# Patient Record
Sex: Male | Born: 1935 | Race: Black or African American | Hispanic: No | Marital: Single | Smoking: Never smoker
Health system: Southern US, Community
[De-identification: ages and names within clinical notes are randomized; demographics above are authoritative.]

## PROBLEM LIST (undated history)

## (undated) DIAGNOSIS — N189 Chronic kidney disease, unspecified: Secondary | ICD-10-CM

## (undated) DIAGNOSIS — S72009A Fracture of unspecified part of neck of unspecified femur, initial encounter for closed fracture: Secondary | ICD-10-CM

## (undated) DIAGNOSIS — I4891 Unspecified atrial fibrillation: Secondary | ICD-10-CM

## (undated) DIAGNOSIS — K219 Gastro-esophageal reflux disease without esophagitis: Secondary | ICD-10-CM

## (undated) DIAGNOSIS — E079 Disorder of thyroid, unspecified: Secondary | ICD-10-CM

## (undated) DIAGNOSIS — R29898 Other symptoms and signs involving the musculoskeletal system: Secondary | ICD-10-CM

## (undated) DIAGNOSIS — M1990 Unspecified osteoarthritis, unspecified site: Secondary | ICD-10-CM

## (undated) HISTORY — DX: Chronic kidney disease, unspecified: N18.9

## (undated) HISTORY — DX: Other symptoms and signs involving the musculoskeletal system: R29.898

## (undated) HISTORY — PX: EYE SURGERY: SHX253

## (undated) HISTORY — DX: Gastro-esophageal reflux disease without esophagitis: K21.9

## (undated) HISTORY — PX: HIP SURGERY: SHX245

## (undated) HISTORY — DX: Unspecified osteoarthritis, unspecified site: M19.90

## (undated) HISTORY — DX: Disorder of thyroid, unspecified: E07.9

## (undated) HISTORY — DX: Unspecified atrial fibrillation: I48.91

## (undated) HISTORY — DX: Fracture of unspecified part of neck of unspecified femur, initial encounter for closed fracture: S72.009A

---

## 2010-08-09 DIAGNOSIS — S72009A Fracture of unspecified part of neck of unspecified femur, initial encounter for closed fracture: Secondary | ICD-10-CM

## 2010-08-09 HISTORY — DX: Fracture of unspecified part of neck of unspecified femur, initial encounter for closed fracture: S72.009A

## 2010-08-10 ENCOUNTER — Ambulatory Visit: Payer: Self-pay | Admitting: Cardiology

## 2010-08-10 ENCOUNTER — Inpatient Hospital Stay (HOSPITAL_COMMUNITY): Admission: EM | Admit: 2010-08-10 | Discharge: 2010-08-15 | Payer: Self-pay | Admitting: Emergency Medicine

## 2010-12-20 LAB — BASIC METABOLIC PANEL
BUN: 15 mg/dL (ref 6–23)
CO2: 24 mEq/L (ref 19–32)
CO2: 24 mEq/L (ref 19–32)
CO2: 25 mEq/L (ref 19–32)
CO2: 26 mEq/L (ref 19–32)
Calcium: 8.5 mg/dL (ref 8.4–10.5)
Calcium: 8.7 mg/dL (ref 8.4–10.5)
Chloride: 105 mEq/L (ref 96–112)
Chloride: 108 mEq/L (ref 96–112)
Chloride: 110 mEq/L (ref 96–112)
Creatinine, Ser: 1.66 mg/dL — ABNORMAL HIGH (ref 0.4–1.5)
GFR calc Af Amer: 60 mL/min — ABNORMAL LOW (ref >60)
GFR calc Af Amer: >60 mL/min (ref >60)
GFR calc non Af Amer: 39 mL/min — ABNORMAL LOW (ref >60)
GFR calc non Af Amer: 53 mL/min — ABNORMAL LOW (ref >60)
Glucose, Bld: 105 mg/dL — ABNORMAL HIGH (ref 70–99)
Glucose, Bld: 113 mg/dL — ABNORMAL HIGH (ref 70–99)
Glucose, Bld: 121 mg/dL — ABNORMAL HIGH (ref 70–99)
Potassium: 4.2 mEq/L (ref 3.5–5.1)
Potassium: 4.4 mEq/L (ref 3.5–5.1)
Potassium: 4.5 mEq/L (ref 3.5–5.1)
Potassium: 4.5 mEq/L (ref 3.5–5.1)
Potassium: 4.6 mEq/L (ref 3.5–5.1)
Sodium: 135 mEq/L (ref 135–145)
Sodium: 137 mEq/L (ref 135–145)
Sodium: 137 mEq/L (ref 135–145)
Sodium: 137 mEq/L (ref 135–145)

## 2010-12-20 LAB — CBC
HCT: 29.3 % — ABNORMAL LOW (ref 39.0–52.0)
HCT: 32.7 % — ABNORMAL LOW (ref 39.0–52.0)
HCT: 34.2 % — ABNORMAL LOW (ref 39.0–52.0)
HCT: 39.3 % (ref 39.0–52.0)
HCT: 41.8 % (ref 39.0–52.0)
Hemoglobin: 10.5 g/dL — ABNORMAL LOW (ref 13.0–17.0)
Hemoglobin: 11 g/dL — ABNORMAL LOW (ref 13.0–17.0)
Hemoglobin: 12.5 g/dL — ABNORMAL LOW (ref 13.0–17.0)
Hemoglobin: 13.5 g/dL (ref 13.0–17.0)
MCH: 26.5 pg (ref 26.0–34.0)
MCH: 27.1 pg (ref 26.0–34.0)
MCHC: 31.8 g/dL (ref 30.0–36.0)
MCHC: 32.2 g/dL (ref 30.0–36.0)
MCV: 82.5 fL (ref 78.0–100.0)
MCV: 83.8 fL (ref 78.0–100.0)
MCV: 84.1 fL (ref 78.0–100.0)
Platelets: 166 10*3/uL (ref 150–400)
Platelets: 170 10*3/uL (ref 150–400)
RBC: 3.55 MIL/uL — ABNORMAL LOW (ref 4.22–5.81)
RBC: 3.89 MIL/uL — ABNORMAL LOW (ref 4.22–5.81)
RBC: 4.12 MIL/uL — ABNORMAL LOW (ref 4.22–5.81)
RBC: 4.99 MIL/uL (ref 4.22–5.81)
RDW: 14.6 % (ref 11.5–15.5)
WBC: 10.5 10*3/uL (ref 4.0–10.5)
WBC: 7.4 10*3/uL (ref 4.0–10.5)
WBC: 9.2 10*3/uL (ref 4.0–10.5)

## 2010-12-20 LAB — COMPREHENSIVE METABOLIC PANEL
Albumin: 3.3 g/dL — ABNORMAL LOW (ref 3.5–5.2)
Alkaline Phosphatase: 60 U/L (ref 39–117)
BUN: 15 mg/dL (ref 6–23)
CO2: 24 mEq/L (ref 19–32)
Chloride: 106 mEq/L (ref 96–112)
GFR calc non Af Amer: 36 mL/min — ABNORMAL LOW (ref >60)
Glucose, Bld: 99 mg/dL (ref 70–99)
Potassium: 4.2 mEq/L (ref 3.5–5.1)
Total Bilirubin: 0.7 mg/dL (ref 0.3–1.2)

## 2010-12-20 LAB — PROTIME-INR
INR: 1.34 (ref 0.00–1.49)
INR: 1.39 (ref 0.00–1.49)
INR: 1.55 — ABNORMAL HIGH (ref 0.00–1.49)
INR: 1.56 — ABNORMAL HIGH (ref 0.00–1.49)
INR: 1.86 — ABNORMAL HIGH (ref 0.00–1.49)
Prothrombin Time: 17.3 seconds — ABNORMAL HIGH (ref 11.6–15.2)
Prothrombin Time: 20.9 seconds — ABNORMAL HIGH (ref 11.6–15.2)

## 2010-12-20 LAB — DIFFERENTIAL
Basophils Absolute: 0 10*3/uL (ref 0.0–0.1)
Basophils Relative: 0 % (ref 0–1)
Monocytes Absolute: 0.6 10*3/uL (ref 0.1–1.0)
Neutro Abs: 8.9 10*3/uL — ABNORMAL HIGH (ref 1.7–7.7)

## 2010-12-20 LAB — CROSSMATCH: Unit division: 0

## 2010-12-20 LAB — LIPID PANEL
Cholesterol: 101 mg/dL (ref 0–200)
HDL: 49 mg/dL (ref >39)
Triglycerides: 62 mg/dL (ref <150)

## 2010-12-20 LAB — APTT: aPTT: 32 seconds (ref 24–37)

## 2010-12-20 LAB — ABO/RH: ABO/RH(D): A POS

## 2011-01-03 ENCOUNTER — Encounter: Payer: Self-pay | Admitting: Family Medicine

## 2011-01-03 ENCOUNTER — Ambulatory Visit (INDEPENDENT_AMBULATORY_CARE_PROVIDER_SITE_OTHER): Payer: MEDICARE | Admitting: Family Medicine

## 2011-01-03 ENCOUNTER — Telehealth: Payer: Self-pay | Admitting: Family Medicine

## 2011-01-03 DIAGNOSIS — E039 Hypothyroidism, unspecified: Secondary | ICD-10-CM | POA: Insufficient documentation

## 2011-01-03 DIAGNOSIS — I480 Paroxysmal atrial fibrillation: Secondary | ICD-10-CM | POA: Insufficient documentation

## 2011-01-03 DIAGNOSIS — Z7901 Long term (current) use of anticoagulants: Secondary | ICD-10-CM | POA: Insufficient documentation

## 2011-01-03 DIAGNOSIS — Z5181 Encounter for therapeutic drug level monitoring: Secondary | ICD-10-CM

## 2011-01-03 DIAGNOSIS — M1990 Unspecified osteoarthritis, unspecified site: Secondary | ICD-10-CM

## 2011-01-03 DIAGNOSIS — M6281 Muscle weakness (generalized): Secondary | ICD-10-CM

## 2011-01-03 DIAGNOSIS — I4891 Unspecified atrial fibrillation: Secondary | ICD-10-CM

## 2011-01-03 DIAGNOSIS — R29898 Other symptoms and signs involving the musculoskeletal system: Secondary | ICD-10-CM | POA: Insufficient documentation

## 2011-01-03 LAB — POCT INR: INR: 1.8

## 2011-01-03 MED ORDER — LEVOTHYROXINE SODIUM 88 MCG PO TABS: 88.0000 ug | ORAL_TABLET | Freq: Every day | ORAL | Status: DC

## 2011-01-03 NOTE — Patient Instructions (Addendum)
Schedule a follow up appointment for INR in:1 month. Don't change your thyroid medicine.  I'll get your old records.   Take care and call the clinic if you have concerns.  Glad to see you today.  Schedule a follow up appointment with Para March in 6 months.  OV.

## 2011-01-03 NOTE — Assessment & Plan Note (Signed)
S/p R hip pin per Dr. Charlann Boxer

## 2011-01-03 NOTE — Assessment & Plan Note (Signed)
Requesting old records. Prev TSH wnl at [facility].

## 2011-01-03 NOTE — Assessment & Plan Note (Signed)
Requesting old records. INR today.

## 2011-01-03 NOTE — Assessment & Plan Note (Signed)
Requesting old records.  Longstanding.

## 2011-01-03 NOTE — Telephone Encounter (Signed)
Order for INR

## 2011-01-04 ENCOUNTER — Encounter: Payer: Self-pay | Admitting: Family Medicine

## 2011-01-04 ENCOUNTER — Telehealth: Payer: Self-pay | Admitting: *Deleted

## 2011-01-04 DIAGNOSIS — L602 Onychogryphosis: Secondary | ICD-10-CM

## 2011-01-04 NOTE — Telephone Encounter (Signed)
I ordered the referral.  Please thank patient for the reminder.  I didn't order the referral yesterday and I apologize for that.  Thanks.

## 2011-01-04 NOTE — Telephone Encounter (Signed)
Pt was seen yesterday and was told that he would be referred to a podiatrist to have his nails trimmed, he would prefer to go to [facility].

## 2011-01-04 NOTE — Progress Notes (Signed)
New Pt.   H/o likely AF with known IRR per patient on coumadin for years.  "I think it was from the fibrillation with my heart."  Requesting records.  Due for INR check.  No bleeding, bruising.  No complaints.  R hip fx and repair per ortho.  [facility] notes reviewed from 2011.  Longstanding L leg weakness, requesting records.  Known foot drop per patient but he doesn't give h/o CVA.  Pt doesn't know the source of the weakness and family doesn't either.  H/o thyroid disease, requesting records.  Prev TSH wnl in [facility] 2011.    Has had some occ abdominal pain L side, mainly with repositioning since going home from the rehab facility after hip fx.  Intermittent, not related to BM, not present now.  No associated sx.   PMH and SH reviewed  ROS: See HPI, otherwise noncontributory.  Meds, vitals, and allergies reviewed.   Nad, in WC Mmm Poor dentition Neck supple IRR but not tachy ctab Abdomen soft, not ttp  L leg diffusely weak compared to R leg, with weakness on dorsiflexion noted. 1+ DP pulses b.  Trace edema in L foot, but this appears chronic.  Nails thickened.  No skin breakdown.

## 2011-01-31 ENCOUNTER — Ambulatory Visit (INDEPENDENT_AMBULATORY_CARE_PROVIDER_SITE_OTHER): Payer: MEDICARE | Admitting: Family Medicine

## 2011-01-31 DIAGNOSIS — Z7901 Long term (current) use of anticoagulants: Secondary | ICD-10-CM

## 2011-01-31 DIAGNOSIS — I4891 Unspecified atrial fibrillation: Secondary | ICD-10-CM

## 2011-01-31 DIAGNOSIS — Z5181 Encounter for therapeutic drug level monitoring: Secondary | ICD-10-CM

## 2011-01-31 LAB — POCT INR: INR: 2.4

## 2011-02-28 ENCOUNTER — Ambulatory Visit (INDEPENDENT_AMBULATORY_CARE_PROVIDER_SITE_OTHER): Payer: Federal, State, Local not specified - PPO | Admitting: Family Medicine

## 2011-02-28 ENCOUNTER — Other Ambulatory Visit: Payer: Federal, State, Local not specified - PPO

## 2011-02-28 DIAGNOSIS — Z7901 Long term (current) use of anticoagulants: Secondary | ICD-10-CM

## 2011-02-28 DIAGNOSIS — Z5181 Encounter for therapeutic drug level monitoring: Secondary | ICD-10-CM

## 2011-02-28 DIAGNOSIS — I4891 Unspecified atrial fibrillation: Secondary | ICD-10-CM

## 2011-02-28 LAB — POCT INR: INR: 2.1

## 2011-03-14 ENCOUNTER — Other Ambulatory Visit: Payer: Self-pay | Admitting: Family Medicine

## 2011-03-14 DIAGNOSIS — I4891 Unspecified atrial fibrillation: Secondary | ICD-10-CM

## 2011-03-14 DIAGNOSIS — O323XX0 Maternal care for face, brow and chin presentation, not applicable or unspecified: Secondary | ICD-10-CM

## 2011-03-16 ENCOUNTER — Other Ambulatory Visit (INDEPENDENT_AMBULATORY_CARE_PROVIDER_SITE_OTHER): Payer: MEDICARE | Admitting: Family Medicine

## 2011-03-16 DIAGNOSIS — Z7901 Long term (current) use of anticoagulants: Secondary | ICD-10-CM

## 2011-03-16 DIAGNOSIS — I4891 Unspecified atrial fibrillation: Secondary | ICD-10-CM

## 2011-03-16 LAB — CBC WITH DIFFERENTIAL/PLATELET
Basophils Absolute: 0 10*3/uL (ref 0.0–0.1)
Basophils Relative: 0.5 % (ref 0.0–3.0)
Eosinophils Absolute: 0.2 10*3/uL (ref 0.0–0.7)
Lymphocytes Relative: 25.8 % (ref 12.0–46.0)
MCHC: 33.2 g/dL (ref 30.0–36.0)
MCV: 82 fl (ref 78.0–100.0)
Monocytes Absolute: 0.4 10*3/uL (ref 0.1–1.0)
Neutrophils Relative %: 63.8 % (ref 43.0–77.0)
Platelets: 214 10*3/uL (ref 150.0–400.0)
RDW: 17 % — ABNORMAL HIGH (ref 11.5–14.6)

## 2011-03-16 LAB — LIPID PANEL
Cholesterol: 136 mg/dL (ref 0–200)
LDL Cholesterol: 63 mg/dL (ref 0–99)
Total CHOL/HDL Ratio: 3
Triglycerides: 129 mg/dL (ref 0.0–149.0)
VLDL: 25.8 mg/dL (ref 0.0–40.0)

## 2011-03-16 LAB — COMPREHENSIVE METABOLIC PANEL
AST: 20 U/L (ref 0–37)
Albumin: 3.5 g/dL (ref 3.5–5.2)
Alkaline Phosphatase: 78 U/L (ref 39–117)
BUN: 15 mg/dL (ref 6–23)
Glucose, Bld: 92 mg/dL (ref 70–99)
Potassium: 4.1 mEq/L (ref 3.5–5.1)
Sodium: 139 mEq/L (ref 135–145)
Total Bilirubin: 0.3 mg/dL (ref 0.3–1.2)

## 2011-03-17 ENCOUNTER — Ambulatory Visit: Payer: Self-pay | Admitting: Family Medicine

## 2011-03-24 ENCOUNTER — Ambulatory Visit (INDEPENDENT_AMBULATORY_CARE_PROVIDER_SITE_OTHER): Payer: Federal, State, Local not specified - PPO | Admitting: Family Medicine

## 2011-03-24 ENCOUNTER — Encounter: Payer: Self-pay | Admitting: Family Medicine

## 2011-03-24 DIAGNOSIS — Z7901 Long term (current) use of anticoagulants: Secondary | ICD-10-CM

## 2011-03-24 DIAGNOSIS — Z5181 Encounter for therapeutic drug level monitoring: Secondary | ICD-10-CM

## 2011-03-24 DIAGNOSIS — M6281 Muscle weakness (generalized): Secondary | ICD-10-CM

## 2011-03-24 DIAGNOSIS — I4891 Unspecified atrial fibrillation: Secondary | ICD-10-CM

## 2011-03-24 DIAGNOSIS — R29898 Other symptoms and signs involving the musculoskeletal system: Secondary | ICD-10-CM

## 2011-03-24 DIAGNOSIS — Z7189 Other specified counseling: Secondary | ICD-10-CM

## 2011-03-24 DIAGNOSIS — Z125 Encounter for screening for malignant neoplasm of prostate: Secondary | ICD-10-CM

## 2011-03-24 DIAGNOSIS — Z1211 Encounter for screening for malignant neoplasm of colon: Secondary | ICD-10-CM

## 2011-03-24 DIAGNOSIS — E039 Hypothyroidism, unspecified: Secondary | ICD-10-CM

## 2011-03-24 LAB — POCT INR: INR: 2.5

## 2011-03-24 NOTE — Patient Instructions (Signed)
Continue 2.5 mg daily, recheck 4 weeks 

## 2011-03-24 NOTE — Patient Instructions (Addendum)
Check with your insurance to see if they will cover the shingles shot. I would get a flu shot each fall.   I want to see you back in 6 months for a 30 minute visit. Go to the lab for your INR.  Take care.   Check on getting TED hose for your legs.  Try to keep your legs elevated.

## 2011-03-26 ENCOUNTER — Encounter: Payer: Self-pay | Admitting: Family Medicine

## 2011-03-26 DIAGNOSIS — Z7189 Other specified counseling: Secondary | ICD-10-CM | POA: Insufficient documentation

## 2011-03-26 DIAGNOSIS — Z1211 Encounter for screening for malignant neoplasm of colon: Secondary | ICD-10-CM | POA: Insufficient documentation

## 2011-03-26 DIAGNOSIS — Z125 Encounter for screening for malignant neoplasm of prostate: Secondary | ICD-10-CM | POA: Insufficient documentation

## 2011-03-26 NOTE — Assessment & Plan Note (Signed)
At baseline, continue with parking sticker and wheelchair prn.  Form filled out for parking sticker.

## 2011-03-26 NOTE — Assessment & Plan Note (Addendum)
No change in meds.  I don't want to dec his pressures.  D/w pt.  He'll elevate the legs and get ted hose to see if this helps. F/u as planned, sooner prn.

## 2011-03-26 NOTE — Assessment & Plan Note (Signed)
TSH wnl, no change in meds.  

## 2011-03-26 NOTE — Progress Notes (Signed)
Routine OV.  Labs d/w pt.   AF- no tachy/palpitations per pt.  No CP.  BLE edema continues is legs are dependent.  He would like gentler compression hose; he's asking about options.  Feeling well o/w.   Thyroid disease, on replacement and labs d/w pt.  Doing well w/o mass in neck, neck pain, dysphagia.  Renal disease, baseline Cr ~1.6.  No recent change.    Occ L abd wall pain with rotation, getting in and out of bed.  No other times when it is painful.  No change in BMs.   No injury.   Colon cancer screening.  Prev with colonoscopy done.  I d/w pt and family today.  Without passing blood- and he isn't know to be doing so- we talked about not screening further since he would be higher risk for anesthesia given his age.  He and his family thought it wise not to proceed with screening.  Prostate CA screening- d/w. No FH.  No symptoms.  As above, he and family thought it reasonable no to pursue screening.  I support this as he would likely have to hold the coumadin for a biopsy, should he have an abnormality.    Old records requested.   PMH and SH reviewed  ROS: See HPI, otherwise noncontributory.  Meds, vitals, and allergies reviewed.   Nad, in w/c ncat Tm w/o erythema Mmm, poor dentition Neck supple w/o TMG, LA rrr with occ ectopy ctab abd soft, not ttp Ext with symm 1+ edema and 1+ DP pulses

## 2011-04-21 ENCOUNTER — Ambulatory Visit (INDEPENDENT_AMBULATORY_CARE_PROVIDER_SITE_OTHER): Payer: Federal, State, Local not specified - PPO | Admitting: Family Medicine

## 2011-04-21 DIAGNOSIS — Z7901 Long term (current) use of anticoagulants: Secondary | ICD-10-CM

## 2011-04-21 DIAGNOSIS — Z5181 Encounter for therapeutic drug level monitoring: Secondary | ICD-10-CM

## 2011-04-21 DIAGNOSIS — I4891 Unspecified atrial fibrillation: Secondary | ICD-10-CM

## 2011-04-21 LAB — POCT INR: INR: 2.9

## 2011-04-21 NOTE — Patient Instructions (Addendum)
Continue 2.5 mg daily, recheck 4 weeks 

## 2011-05-02 ENCOUNTER — Encounter: Payer: Self-pay | Admitting: Podiatry

## 2011-05-19 ENCOUNTER — Ambulatory Visit (INDEPENDENT_AMBULATORY_CARE_PROVIDER_SITE_OTHER): Payer: Federal, State, Local not specified - PPO | Admitting: Family Medicine

## 2011-05-19 DIAGNOSIS — Z5181 Encounter for therapeutic drug level monitoring: Secondary | ICD-10-CM

## 2011-05-19 DIAGNOSIS — I4891 Unspecified atrial fibrillation: Secondary | ICD-10-CM

## 2011-05-19 DIAGNOSIS — Z7901 Long term (current) use of anticoagulants: Secondary | ICD-10-CM

## 2011-05-19 LAB — POCT INR: INR: 2.4

## 2011-05-19 NOTE — Patient Instructions (Signed)
Continue current dose, check in 4 weeks  

## 2011-06-05 ENCOUNTER — Encounter: Payer: Self-pay | Admitting: Family Medicine

## 2011-06-05 DIAGNOSIS — N183 Chronic kidney disease, stage 3 (moderate): Secondary | ICD-10-CM

## 2011-06-05 DIAGNOSIS — K209 Esophagitis, unspecified: Secondary | ICD-10-CM

## 2011-06-05 DIAGNOSIS — N179 Acute kidney failure, unspecified: Secondary | ICD-10-CM | POA: Insufficient documentation

## 2011-06-16 ENCOUNTER — Other Ambulatory Visit: Payer: Self-pay | Admitting: *Deleted

## 2011-06-16 ENCOUNTER — Ambulatory Visit (INDEPENDENT_AMBULATORY_CARE_PROVIDER_SITE_OTHER): Payer: Federal, State, Local not specified - PPO | Admitting: Family Medicine

## 2011-06-16 DIAGNOSIS — E039 Hypothyroidism, unspecified: Secondary | ICD-10-CM

## 2011-06-16 DIAGNOSIS — Z5181 Encounter for therapeutic drug level monitoring: Secondary | ICD-10-CM

## 2011-06-16 DIAGNOSIS — Z7901 Long term (current) use of anticoagulants: Secondary | ICD-10-CM

## 2011-06-16 DIAGNOSIS — I4891 Unspecified atrial fibrillation: Secondary | ICD-10-CM

## 2011-06-16 MED ORDER — WARFARIN SODIUM 5 MG PO TABS: ORAL_TABLET | ORAL | Status: DC

## 2011-06-16 MED ORDER — LEVOTHYROXINE SODIUM 88 MCG PO TABS: 88.0000 ug | ORAL_TABLET | Freq: Every day | ORAL | Status: DC

## 2011-06-16 NOTE — Patient Instructions (Signed)
Continue 2.5 mg daily, recheck 4 weeks 

## 2011-07-04 ENCOUNTER — Ambulatory Visit (INDEPENDENT_AMBULATORY_CARE_PROVIDER_SITE_OTHER): Payer: Federal, State, Local not specified - PPO | Admitting: Family Medicine

## 2011-07-04 ENCOUNTER — Encounter: Payer: Self-pay | Admitting: Family Medicine

## 2011-07-04 VITALS — BP 118/76 | HR 68 | Temp 98.6°F | Wt 172.2 lb

## 2011-07-04 DIAGNOSIS — R109 Unspecified abdominal pain: Secondary | ICD-10-CM

## 2011-07-04 DIAGNOSIS — R209 Unspecified disturbances of skin sensation: Secondary | ICD-10-CM

## 2011-07-04 DIAGNOSIS — R200 Anesthesia of skin: Secondary | ICD-10-CM | POA: Insufficient documentation

## 2011-07-04 DIAGNOSIS — R29898 Other symptoms and signs involving the musculoskeletal system: Secondary | ICD-10-CM

## 2011-07-04 DIAGNOSIS — K117 Disturbances of salivary secretion: Secondary | ICD-10-CM

## 2011-07-04 DIAGNOSIS — M6281 Muscle weakness (generalized): Secondary | ICD-10-CM

## 2011-07-04 DIAGNOSIS — R682 Dry mouth, unspecified: Secondary | ICD-10-CM | POA: Insufficient documentation

## 2011-07-04 DIAGNOSIS — Z23 Encounter for immunization: Secondary | ICD-10-CM

## 2011-07-04 DIAGNOSIS — R202 Paresthesia of skin: Secondary | ICD-10-CM

## 2011-07-04 DIAGNOSIS — K209 Esophagitis, unspecified without bleeding: Secondary | ICD-10-CM

## 2011-07-04 NOTE — Assessment & Plan Note (Signed)
Brief sx w/o red flag sx. Likely MSK source, will have pt use PT and try to ease in/out of bed as this is the time he is most likely to have sx.

## 2011-07-04 NOTE — Patient Instructions (Signed)
See Shirlee Limerick about your referral before your leave today. I would get a cock up splint for your wrist.  See if that helps.   Take randitine 75-150mg  a day for the heartburn.  You can take it twice a day if needed. I want you to come back for a visit in about 4 months.  Take care.   Glad to see you.

## 2011-07-04 NOTE — Assessment & Plan Note (Signed)
Restart PT and follow clinically.

## 2011-07-04 NOTE — Assessment & Plan Note (Signed)
With likely GERD sx.  Start H2 blocker and notify clinic if sx continue.

## 2011-07-04 NOTE — Assessment & Plan Note (Signed)
Sugar wnl, d/w pt about limiting sweets and f/u prn.

## 2011-07-04 NOTE — Assessment & Plan Note (Signed)
Likely median nerve and d/w pt about cock up splint, notify me if not improved.

## 2011-07-04 NOTE — Progress Notes (Signed)
Polyphagia. Eating a lot of sweets, some dry mouth. He was asking about DM2.  No hx.  No polyuria.  Sugar 91 today.    L abd pain.  As prev.  Brief, positional, getting in and out of bed.  No fever, chills, diarrhea, blood in stools.  Episodic.    GERD- occ heartburn.  Occ regurg of food.  Taking tums with occ relief.  No hematemesis.    L hand numb.  Episodic.  L 1-3rd fingers.  No weakness. No trauma.  No rash or skin changes.  R handed.   L leg weak.  Unclear source, prev with unremarkable eval by Dr. Charlann Boxer with ortho.  Prev responded to PT, but dec in strength after PT stopped.  No acute changes.    PMH and SH reviewed  ROS: See HPI, otherwise noncontributory.  Meds, vitals, and allergies reviewed.   nad ncat Poor dentition IRR, not tachy ctab abd soft, not ttp, normal bs, no masses, no rebound Ext with trace edema, sensation intact L leg globally weak- flex/ext of major muscle groups.  Pulses intact.   L hand with normal inspection and sensation, grip wnl, phalen and tinel wnl x2

## 2011-07-07 ENCOUNTER — Telehealth: Payer: Self-pay | Admitting: *Deleted

## 2011-07-07 NOTE — Telephone Encounter (Signed)
Please give verbal for PT to see pt once a week times one week and 2 times a week times 4 weeks for lower leg strengthening, gait and balance training. And give order for social work eval, to help pt with community and [employer].

## 2011-07-07 NOTE — Telephone Encounter (Signed)
Physical therapist is asking for verbal ok to see patient once a week times one week and 2 times a week times 4 weeks for lower leg strengthening, gait and balance training.  She would also like order for social work eval, to help pt with community and [employer].

## 2011-07-07 NOTE — Telephone Encounter (Signed)
Left message on personal cell phone voicemail of Dr. Hyman Hopes giving verbal authorization as stated below.

## 2011-07-14 ENCOUNTER — Ambulatory Visit (INDEPENDENT_AMBULATORY_CARE_PROVIDER_SITE_OTHER): Payer: Federal, State, Local not specified - PPO | Admitting: Family Medicine

## 2011-07-14 DIAGNOSIS — Z5181 Encounter for therapeutic drug level monitoring: Secondary | ICD-10-CM

## 2011-07-14 DIAGNOSIS — Z7901 Long term (current) use of anticoagulants: Secondary | ICD-10-CM

## 2011-07-14 DIAGNOSIS — I4891 Unspecified atrial fibrillation: Secondary | ICD-10-CM

## 2011-07-14 NOTE — Patient Instructions (Signed)
Continue 2.5 mg daily, recheck 4 weeks 

## 2011-07-19 DIAGNOSIS — R262 Difficulty in walking, not elsewhere classified: Secondary | ICD-10-CM

## 2011-07-19 DIAGNOSIS — E039 Hypothyroidism, unspecified: Secondary | ICD-10-CM

## 2011-07-19 DIAGNOSIS — C61 Malignant neoplasm of prostate: Secondary | ICD-10-CM

## 2011-07-19 DIAGNOSIS — N183 Chronic kidney disease, stage 3 (moderate): Secondary | ICD-10-CM

## 2011-08-11 ENCOUNTER — Ambulatory Visit (INDEPENDENT_AMBULATORY_CARE_PROVIDER_SITE_OTHER): Payer: Federal, State, Local not specified - PPO | Admitting: Family Medicine

## 2011-08-11 DIAGNOSIS — I4891 Unspecified atrial fibrillation: Secondary | ICD-10-CM

## 2011-08-11 DIAGNOSIS — Z5181 Encounter for therapeutic drug level monitoring: Secondary | ICD-10-CM

## 2011-08-11 DIAGNOSIS — Z7901 Long term (current) use of anticoagulants: Secondary | ICD-10-CM

## 2011-08-11 NOTE — Patient Instructions (Signed)
Continue current dose, check in 4 weeks  

## 2011-08-21 ENCOUNTER — Encounter: Payer: Self-pay | Admitting: Family Medicine

## 2011-08-21 ENCOUNTER — Ambulatory Visit (INDEPENDENT_AMBULATORY_CARE_PROVIDER_SITE_OTHER): Payer: Federal, State, Local not specified - PPO | Admitting: Family Medicine

## 2011-08-21 VITALS — HR 67 | Temp 99.4°F

## 2011-08-21 DIAGNOSIS — J069 Acute upper respiratory infection, unspecified: Secondary | ICD-10-CM

## 2011-08-21 MED ORDER — BENZONATATE 200 MG PO CAPS: 200.0000 mg | ORAL_CAPSULE | Freq: Three times a day (TID) | ORAL | Status: AC | PRN

## 2011-08-21 NOTE — Patient Instructions (Signed)
Take the tessalon for cough.  Drink plenty of fluids, take tylenol as needed, and gargle with warm salt water for your throat.  This should gradually improve.  Take care.  Let us know if you have other concerns.    

## 2011-08-21 NOTE — Progress Notes (Signed)
duration of symptoms: 4-5 days rhinorrhea:yes Congestion:not much  ear pain:no sore throat: no but 'itchy and dry' Voice change noted, since yesterday Cough: yes, but no sputum Myalgias:no Fever: none known, but mild elevation in temp today at clinic Dec in appetite.  other concerns: took some robitussin w/o much help for the cough, initially with unilateral eye redness and now B eye redness.  No eye pain.  [name] had some thin eye discharge.   ROS: See HPI.  Otherwise negative.    Meds, vitals, and allergies reviewed.   GEN: nad, alert and oriented HEENT: mucous membranes moist, TM w/o erythema, nasal epithelium injected, OP with cobblestoning, perrl, eomi, B limbus sparing conjunctival injection with scant this discharge NECK: supple w/o LA CV: IRR, not tachy. PULM: ctab, no inc wob, no true cough but needs to clear throat during exam ABD: soft, +bs EXT: trace BLE edema

## 2011-08-22 ENCOUNTER — Encounter: Payer: Self-pay | Admitting: Family Medicine

## 2011-08-22 DIAGNOSIS — J069 Acute upper respiratory infection, unspecified: Secondary | ICD-10-CM | POA: Insufficient documentation

## 2011-08-22 NOTE — Assessment & Plan Note (Signed)
Nontoxic, ctab, no sinus tenderness, no sig cough (I think this mainly due to throat irritation from post nasal gtt), no AOM and likely a viral syndrome with concurrent conjunctivitis.  Supportive care, f/u prn. Tessalon for cough due to upper airway irritation.  F/u prn.  He and family agree. Should gradually improve.  Hand washing for pt and family.

## 2011-09-08 ENCOUNTER — Ambulatory Visit (INDEPENDENT_AMBULATORY_CARE_PROVIDER_SITE_OTHER): Payer: Federal, State, Local not specified - PPO | Admitting: Family Medicine

## 2011-09-08 DIAGNOSIS — Z5181 Encounter for therapeutic drug level monitoring: Secondary | ICD-10-CM

## 2011-09-08 DIAGNOSIS — Z7901 Long term (current) use of anticoagulants: Secondary | ICD-10-CM

## 2011-09-08 DIAGNOSIS — I4891 Unspecified atrial fibrillation: Secondary | ICD-10-CM

## 2011-09-08 NOTE — Patient Instructions (Signed)
Continue current dose, check in 4 weeks  

## 2011-09-28 ENCOUNTER — Ambulatory Visit (INDEPENDENT_AMBULATORY_CARE_PROVIDER_SITE_OTHER): Payer: Federal, State, Local not specified - PPO | Admitting: Family Medicine

## 2011-09-28 ENCOUNTER — Encounter: Payer: Self-pay | Admitting: Family Medicine

## 2011-09-28 DIAGNOSIS — R682 Dry mouth, unspecified: Secondary | ICD-10-CM

## 2011-09-28 DIAGNOSIS — M6281 Muscle weakness (generalized): Secondary | ICD-10-CM

## 2011-09-28 DIAGNOSIS — E039 Hypothyroidism, unspecified: Secondary | ICD-10-CM

## 2011-09-28 DIAGNOSIS — I4891 Unspecified atrial fibrillation: Secondary | ICD-10-CM

## 2011-09-28 DIAGNOSIS — Z7901 Long term (current) use of anticoagulants: Secondary | ICD-10-CM

## 2011-09-28 DIAGNOSIS — L299 Pruritus, unspecified: Secondary | ICD-10-CM

## 2011-09-28 DIAGNOSIS — M1990 Unspecified osteoarthritis, unspecified site: Secondary | ICD-10-CM

## 2011-09-28 DIAGNOSIS — Z5181 Encounter for therapeutic drug level monitoring: Secondary | ICD-10-CM

## 2011-09-28 DIAGNOSIS — R109 Unspecified abdominal pain: Secondary | ICD-10-CM

## 2011-09-28 DIAGNOSIS — R29898 Other symptoms and signs involving the musculoskeletal system: Secondary | ICD-10-CM

## 2011-09-28 DIAGNOSIS — K117 Disturbances of salivary secretion: Secondary | ICD-10-CM

## 2011-09-28 LAB — POCT INR: INR: 2.9

## 2011-09-28 NOTE — Patient Instructions (Signed)
Schedule a visit in 6 months.  We can do your labs then.   Try to cut back on sweets and use sugar free candy for your dry mouth.   Use OTC 1% hydrocortisone on your back for the itching.  Take care.  Glad to see you.

## 2011-09-28 NOTE — Patient Instructions (Signed)
Continue current dose, check in 4 weeks  

## 2011-09-29 ENCOUNTER — Encounter: Payer: Self-pay | Admitting: Family Medicine

## 2011-09-29 DIAGNOSIS — L299 Pruritus, unspecified: Secondary | ICD-10-CM | POA: Insufficient documentation

## 2011-09-29 NOTE — Assessment & Plan Note (Signed)
Unclear source, i would use prn measures.  He agrees.

## 2011-09-29 NOTE — Assessment & Plan Note (Signed)
Continue as is with meds, felling well, no CP/SOB/tachycardia.

## 2011-09-29 NOTE — Progress Notes (Signed)
AF.  No CP, compliant with anticoagulation.  Minimal BLE edema.  Not sob.  Hypothyroid, compliant with meds, no neck pain, no neck mass.  Occ dry mouth.  Better with candy, sips of fluids.  No new meds, longstanding.   Still with occ lateral abd pain that is brief and positional.  Resolved after a few seconds.  Only with position changes.  No new GI sx.   Lateral deviation of L 1st toe, occ rubs the 2nd toe.    Also with some itching on his back, upper right side.   PMH and SH reviewed  ROS: See HPI, otherwise noncontributory.  Meds, vitals, and allergies reviewed.   nad  ncat  Poor dentition  IRR, not tachy  ctab  abd soft, not ttp, normal bs, no masses, no rebound  Ext with trace edema, sensation intact  L leg globally weak- flex/ext of major muscle groups. Pulses intact.  L foot with lateral deviation of 1st toe, but no erythema in the area of overlap Back with normal inspection of skin

## 2011-09-29 NOTE — Assessment & Plan Note (Signed)
We talked about this.  This could be from distant CVA.  We elected not to w/u further, goal is CVA prevention with AF.

## 2011-09-29 NOTE — Assessment & Plan Note (Signed)
With degenerative appearance at L 1st MTP. D/w pt about protecting skin in the area, use neosporin as needed to cover/lubricate/protect skin between the toes.

## 2011-09-29 NOTE — Assessment & Plan Note (Signed)
Positional, will follow, benign exam.

## 2011-09-29 NOTE — Assessment & Plan Note (Signed)
No neck mass on exam, continue as is with meds.

## 2011-09-29 NOTE — Assessment & Plan Note (Signed)
Topical tx with hydrocortisone prn and follow clinically.

## 2011-10-26 ENCOUNTER — Ambulatory Visit (INDEPENDENT_AMBULATORY_CARE_PROVIDER_SITE_OTHER): Payer: Federal, State, Local not specified - PPO | Admitting: Family Medicine

## 2011-10-26 DIAGNOSIS — I4891 Unspecified atrial fibrillation: Secondary | ICD-10-CM

## 2011-10-26 DIAGNOSIS — Z7901 Long term (current) use of anticoagulants: Secondary | ICD-10-CM

## 2011-10-26 DIAGNOSIS — Z5181 Encounter for therapeutic drug level monitoring: Secondary | ICD-10-CM

## 2011-10-26 LAB — POCT INR: INR: 2.8

## 2011-10-26 NOTE — Patient Instructions (Signed)
Continue 2.5 mg daily, recheck 4 weeks 

## 2011-11-03 ENCOUNTER — Ambulatory Visit: Payer: Federal, State, Local not specified - PPO | Admitting: Family Medicine

## 2011-11-23 ENCOUNTER — Ambulatory Visit (INDEPENDENT_AMBULATORY_CARE_PROVIDER_SITE_OTHER): Payer: Federal, State, Local not specified - PPO | Admitting: Family Medicine

## 2011-11-23 DIAGNOSIS — I4891 Unspecified atrial fibrillation: Secondary | ICD-10-CM

## 2011-11-23 DIAGNOSIS — Z7901 Long term (current) use of anticoagulants: Secondary | ICD-10-CM

## 2011-11-23 DIAGNOSIS — Z5181 Encounter for therapeutic drug level monitoring: Secondary | ICD-10-CM

## 2011-11-23 LAB — POCT INR: INR: 2.2

## 2011-11-23 NOTE — Patient Instructions (Signed)
Continue current dose, check in 4 weeks  

## 2011-12-21 ENCOUNTER — Ambulatory Visit (INDEPENDENT_AMBULATORY_CARE_PROVIDER_SITE_OTHER): Payer: Federal, State, Local not specified - PPO | Admitting: Family Medicine

## 2011-12-21 DIAGNOSIS — I4891 Unspecified atrial fibrillation: Secondary | ICD-10-CM

## 2011-12-21 DIAGNOSIS — Z7901 Long term (current) use of anticoagulants: Secondary | ICD-10-CM

## 2011-12-21 DIAGNOSIS — Z5181 Encounter for therapeutic drug level monitoring: Secondary | ICD-10-CM

## 2011-12-21 NOTE — Patient Instructions (Signed)
Continue current dose, check in 4 weeks  

## 2011-12-22 ENCOUNTER — Encounter: Payer: Self-pay | Admitting: Family Medicine

## 2011-12-22 ENCOUNTER — Ambulatory Visit (INDEPENDENT_AMBULATORY_CARE_PROVIDER_SITE_OTHER): Payer: Federal, State, Local not specified - PPO | Admitting: Family Medicine

## 2011-12-22 VITALS — BP 130/70 | HR 82 | Temp 98.2°F | Wt 177.8 lb

## 2011-12-22 DIAGNOSIS — M79673 Pain in unspecified foot: Secondary | ICD-10-CM | POA: Insufficient documentation

## 2011-12-22 DIAGNOSIS — M79609 Pain in unspecified limb: Secondary | ICD-10-CM

## 2011-12-22 DIAGNOSIS — L908 Other atrophic disorders of skin: Secondary | ICD-10-CM

## 2011-12-22 MED ORDER — GABAPENTIN 100 MG PO CAPS: 100.0000 mg | ORAL_CAPSULE | Freq: Every day | ORAL | Status: DC

## 2011-12-22 NOTE — Patient Instructions (Addendum)
Use the doughnut cushion more and let me know if you have more pain in the area.   Take 100mg  gabapentin at night for the foot pain. If you get drowsy or dizzy with the medicine, stop it and let me know.  Call and give me an update in about 2 weeks.  Take care.

## 2011-12-22 NOTE — Progress Notes (Signed)
Sore/skin irritation on buttock.  L sided.  He sits a lot due to leg weakness.   L foot pain, worse at night, sensation of pins sticking in it.  No sx during the day.  Pain episodes can be brief.  No trauma.  Chronic intermittent issue.  Some better with rubbing with foot cream.   No bleeding, no bruising.    Meds, vitals, and allergies reviewed.   ROS: See HPI.  Otherwise, noncontributory.  nad ncat L foot diffusely weak at baseline 2+ DP pulse L great toe partially overlap the 2nd, but no skin breakdown.  Foot not ttp Gluteal crease with symmetric hypopigmented smooth areas. No skin breakdown.

## 2011-12-22 NOTE — Assessment & Plan Note (Signed)
Likely neuropathy, I would try gabapentin at night and see if that helps.  They'll call back with update.

## 2011-12-22 NOTE — Assessment & Plan Note (Signed)
Now resolved with resultant hypopigmentation.  D/w pt about cushion use and monitoring.  He is able to weight shift.

## 2012-01-03 ENCOUNTER — Telehealth: Payer: Self-pay | Admitting: *Deleted

## 2012-01-03 DIAGNOSIS — E039 Hypothyroidism, unspecified: Secondary | ICD-10-CM

## 2012-01-03 MED ORDER — LEVOTHYROXINE SODIUM 88 MCG PO TABS: 88.0000 ug | ORAL_TABLET | Freq: Every day | ORAL | Status: DC

## 2012-01-03 NOTE — Telephone Encounter (Signed)
Fax from [employer] stating levoxyl 88 mcg is not available.  They are asking for an alternative medicine to be sent in. Fax is on your desk.

## 2012-01-03 NOTE — Telephone Encounter (Signed)
rx sent.  Form shredded.  Thanks.

## 2012-01-18 ENCOUNTER — Ambulatory Visit (INDEPENDENT_AMBULATORY_CARE_PROVIDER_SITE_OTHER): Payer: Federal, State, Local not specified - PPO | Admitting: Family Medicine

## 2012-01-18 DIAGNOSIS — I4891 Unspecified atrial fibrillation: Secondary | ICD-10-CM

## 2012-01-18 DIAGNOSIS — Z5181 Encounter for therapeutic drug level monitoring: Secondary | ICD-10-CM

## 2012-01-18 DIAGNOSIS — Z7901 Long term (current) use of anticoagulants: Secondary | ICD-10-CM

## 2012-01-18 NOTE — Patient Instructions (Signed)
Continue 2.5 mg daily, recheck 4 weeks 

## 2012-02-15 ENCOUNTER — Ambulatory Visit (INDEPENDENT_AMBULATORY_CARE_PROVIDER_SITE_OTHER): Payer: Federal, State, Local not specified - PPO | Admitting: Family Medicine

## 2012-02-15 DIAGNOSIS — I4891 Unspecified atrial fibrillation: Secondary | ICD-10-CM

## 2012-02-15 LAB — POCT INR: INR: 2.9

## 2012-02-15 NOTE — Patient Instructions (Signed)
Continue 2.5 mg daily, recheck 4 weeks 

## 2012-03-18 ENCOUNTER — Encounter (HOSPITAL_COMMUNITY): Payer: Self-pay | Admitting: Emergency Medicine

## 2012-03-18 ENCOUNTER — Emergency Department (HOSPITAL_COMMUNITY)
Admission: EM | Admit: 2012-03-18 | Discharge: 2012-03-18 | Disposition: A | Discharge status: Home / Self Care | Payer: Federal, State, Local not specified - PPO | Attending: Emergency Medicine | Admitting: Emergency Medicine

## 2012-03-18 ENCOUNTER — Emergency Department (HOSPITAL_COMMUNITY): Payer: Federal, State, Local not specified - PPO

## 2012-03-18 DIAGNOSIS — M545 Low back pain, unspecified: Secondary | ICD-10-CM | POA: Insufficient documentation

## 2012-03-18 DIAGNOSIS — Y92009 Unspecified place in unspecified non-institutional (private) residence as the place of occurrence of the external cause: Secondary | ICD-10-CM | POA: Insufficient documentation

## 2012-03-18 DIAGNOSIS — K219 Gastro-esophageal reflux disease without esophagitis: Secondary | ICD-10-CM | POA: Insufficient documentation

## 2012-03-18 DIAGNOSIS — E079 Disorder of thyroid, unspecified: Secondary | ICD-10-CM | POA: Insufficient documentation

## 2012-03-18 DIAGNOSIS — IMO0002 Reserved for concepts with insufficient information to code with codable children: Secondary | ICD-10-CM

## 2012-03-18 DIAGNOSIS — M169 Osteoarthritis of hip, unspecified: Secondary | ICD-10-CM | POA: Insufficient documentation

## 2012-03-18 DIAGNOSIS — W1809XA Striking against other object with subsequent fall, initial encounter: Secondary | ICD-10-CM | POA: Insufficient documentation

## 2012-03-18 DIAGNOSIS — M1610 Unilateral primary osteoarthritis, unspecified hip: Secondary | ICD-10-CM | POA: Insufficient documentation

## 2012-03-18 DIAGNOSIS — I4891 Unspecified atrial fibrillation: Secondary | ICD-10-CM | POA: Insufficient documentation

## 2012-03-18 DIAGNOSIS — M8448XA Pathological fracture, other site, initial encounter for fracture: Secondary | ICD-10-CM | POA: Insufficient documentation

## 2012-03-18 DIAGNOSIS — Z79899 Other long term (current) drug therapy: Secondary | ICD-10-CM | POA: Insufficient documentation

## 2012-03-18 DIAGNOSIS — M899 Disorder of bone, unspecified: Secondary | ICD-10-CM | POA: Insufficient documentation

## 2012-03-18 DIAGNOSIS — N189 Chronic kidney disease, unspecified: Secondary | ICD-10-CM | POA: Insufficient documentation

## 2012-03-18 MED ORDER — ACETAMINOPHEN 325 MG PO TABS
325.0000 mg | ORAL_TABLET | Freq: Once | ORAL | Status: AC
  Administered 2012-03-18: 325 mg via ORAL
  Filled 2012-03-18: qty 1

## 2012-03-18 MED ORDER — HYDROCODONE-ACETAMINOPHEN 5-325 MG PO TABS: 1.0000 | ORAL_TABLET | ORAL | Status: DC | PRN

## 2012-03-18 MED ORDER — HYDROCODONE-ACETAMINOPHEN 5-325 MG PO TABS
1.0000 | ORAL_TABLET | Freq: Once | ORAL | Status: AC
  Administered 2012-03-18: 1 via ORAL
  Filled 2012-03-18: qty 1

## 2012-03-18 NOTE — ED Notes (Signed)
Located walker for pt to use. Pt ambulated about 25 ft and back due to pt being anxious. Pt is also complaining of indigestion and having a little sob. RN Fannie Knee informed of same.

## 2012-03-18 NOTE — Discharge Instructions (Signed)
Back, Compression Fracture  A compression fracture happens when a force is put upon the length of your spine. Slipping and falling on your bottom are examples of such a force. When this happens, sometimes the force is great enough to compress the building blocks (vertebral bodies) of your spine. Although this causes a lot of pain, this can usually be treated at home, unless your caregiver feels hospitalization is needed for pain control.  Your backbone (spinal column) is made up of 24 main vertebral bodies in addition to the sacrum and coccyx (see illustration). These are held together by tough fibrous tissues (ligaments) and by support of your muscles. Nerve roots pass through the openings between the vertebrae. A sudden wrenching move, injury, or a fall may cause a compression fracture of one of the vertebral bodies. This may result in back pain or spread of pain into the belly (abdomen), the buttocks, and down the leg into the foot. Pain may also be created by muscle spasm alone.  Large studies have been undertaken to determine the best possible course of action to help your back following injury and also to prevent future problems. The recommendations are as follows.  FOLLOWING A COMPRESSION FRACTURE:  Do the following only if advised by your caregiver.    If a back brace has been suggested or provided, wear it as directed.   DO NOT stop wearing the back brace unless instructed by your caregiver.   When allowed to return to regular activities, avoid a sedentary life style. Actively exercise. Sporadic weekend binges of tennis, racquetball, water skiing, may actually aggravate or create problems, especially if you are not in condition for that activity.   Avoid sports requiring sudden body movements until you are in condition for them. Swimming and walking are safer activities.   Maintain good posture.   Avoid obesity.   If not already done, you should have a DEXA scan. Based on the results, be treated for  osteoporosis.  FOLLOWING ACUTE (SUDDEN) INJURY:   Only take over-the-counter or prescription medicines for pain, discomfort, or fever as directed by your caregiver.   Use bed rest for only the most extreme acute episode. Prolonged bed rest may aggravate your condition. Ice used for acute conditions is effective. Use a large plastic bag filled with ice. Wrap it in a towel. This also provides excellent pain relief. This may be continuous. Or use it for 30 minutes every 2 hours during acute phase, then as needed. Heat for 30 minutes prior to activities is helpful.   As soon as the acute phase (the time when your back is too painful for you to do normal activities) is over, it is important to resume normal activities and work hardening programs. Back injuries can cause potentially marked changes in lifestyle. So it is important to attack these problems aggressively.   See your caregiver for continued problems. He or she can help or refer you for appropriate exercises, physical therapy and work hardening if needed.   If you are given narcotic medications for your condition, for the next 24 hours DO NOT:   Drive   Operate machinery or power tools.   Sign legal documents.   DO NOT drink alcohol, take sleeping pills or other medications that may interfere with treatment.  If your caregiver has given you a follow-up appointment, it is very important to keep that appointment. Not keeping the appointment could result in a chronic or permanent injury, pain, and disability. If there is any   problem keeping the appointment, you must call back to this facility for assistance.   SEEK IMMEDIATE MEDICAL CARE IF:   You develop numbness, tingling, weakness, or problems with the use of your arms or legs.   You develop severe back pain not relieved with medications.   You have changes in bowel or bladder control.   You have increasing pain in any areas of the body.  Document Released: 09/25/2005 Document Revised: 09/14/2011  Document Reviewed: 04/29/2008  [employer] Patient Information 2012 [employer].

## 2012-03-18 NOTE — ED Notes (Signed)
Pt fell at home today and onto buttocks and hit back on bath tub; pt now c/o lower back pain; pt denies LOC; pt unable to stand after fall

## 2012-03-18 NOTE — ED Notes (Signed)
Pt for discharge.Vital signs stable.GCS 15.Abulating with assistance of a walker.Discharged home in wheelchair.

## 2012-03-18 NOTE — ED Notes (Signed)
Pt ambulated out of bed with the help of walker.Pt ambulated with no any complaints.He does complain of indigestion/acid reflex and slight pain in the left hip(chronic Pain)

## 2012-03-20 ENCOUNTER — Telehealth: Payer: Self-pay

## 2012-03-20 MED ORDER — HYDROCODONE-ACETAMINOPHEN 5-325 MG PO TABS: 1.0000 | ORAL_TABLET | ORAL | Status: AC | PRN

## 2012-03-20 NOTE — Telephone Encounter (Signed)
03/18/12 pt fell in bathroom; hit lower back on bathtub. Seen [facility] 03/18/12; pt thought ER was sending pain med to [facility]. Did not send. Pts wife request pain med sent to [facility]. Lower back pain on lt side and hip;comes and goes; worse when moves. Pt not walking with walker due to pain and weakness.Pain level now 8-9.Please advise.

## 2012-03-20 NOTE — Telephone Encounter (Signed)
Please call in, make sure [facility] didn't have the rx already. Sedation caution.  Thanks.  ,

## 2012-03-20 NOTE — Telephone Encounter (Signed)
Medicine called to pharmacy, advised patient's daughter.

## 2012-03-21 ENCOUNTER — Encounter: Payer: Self-pay | Admitting: Family Medicine

## 2012-03-21 ENCOUNTER — Ambulatory Visit (INDEPENDENT_AMBULATORY_CARE_PROVIDER_SITE_OTHER): Payer: Medicare Other | Admitting: Family Medicine

## 2012-03-21 ENCOUNTER — Ambulatory Visit (INDEPENDENT_AMBULATORY_CARE_PROVIDER_SITE_OTHER): Payer: Federal, State, Local not specified - PPO | Admitting: Family Medicine

## 2012-03-21 VITALS — BP 104/62 | HR 60 | Temp 98.8°F

## 2012-03-21 DIAGNOSIS — Z7901 Long term (current) use of anticoagulants: Secondary | ICD-10-CM

## 2012-03-21 DIAGNOSIS — W19XXXA Unspecified fall, initial encounter: Secondary | ICD-10-CM

## 2012-03-21 DIAGNOSIS — I4891 Unspecified atrial fibrillation: Secondary | ICD-10-CM

## 2012-03-21 DIAGNOSIS — M79673 Pain in unspecified foot: Secondary | ICD-10-CM

## 2012-03-21 DIAGNOSIS — M79609 Pain in unspecified limb: Secondary | ICD-10-CM

## 2012-03-21 DIAGNOSIS — Z5181 Encounter for therapeutic drug level monitoring: Secondary | ICD-10-CM

## 2012-03-21 DIAGNOSIS — R29898 Other symptoms and signs involving the musculoskeletal system: Secondary | ICD-10-CM

## 2012-03-21 DIAGNOSIS — M549 Dorsalgia, unspecified: Secondary | ICD-10-CM

## 2012-03-21 DIAGNOSIS — R7401 Elevation of levels of liver transaminase levels: Secondary | ICD-10-CM

## 2012-03-21 LAB — CBC WITH DIFFERENTIAL/PLATELET
Basophils Relative: 0.3 % (ref 0.0–3.0)
Eosinophils Relative: 1.3 % (ref 0.0–5.0)
HCT: 39 % (ref 39.0–52.0)
Hemoglobin: 12.6 g/dL — ABNORMAL LOW (ref 13.0–17.0)
Lymphs Abs: 0.6 10*3/uL — ABNORMAL LOW (ref 0.7–4.0)
MCV: 82.8 fl (ref 78.0–100.0)
Monocytes Absolute: 0.5 10*3/uL (ref 0.1–1.0)
Monocytes Relative: 5.6 % (ref 3.0–12.0)
Neutro Abs: 7.1 10*3/uL (ref 1.4–7.7)
Platelets: 166 10*3/uL (ref 150.0–400.0)
RBC: 4.71 Mil/uL (ref 4.22–5.81)
WBC: 8.4 10*3/uL (ref 4.5–10.5)

## 2012-03-21 LAB — COMPREHENSIVE METABOLIC PANEL
Alkaline Phosphatase: 63 U/L (ref 39–117)
BUN: 17 mg/dL (ref 6–23)
CO2: 26 mEq/L (ref 19–32)
GFR: 56.46 mL/min — ABNORMAL LOW (ref >60.00)
Glucose, Bld: 96 mg/dL (ref 70–99)
Sodium: 140 mEq/L (ref 135–145)
Total Bilirubin: 0.8 mg/dL (ref 0.3–1.2)
Total Protein: 7.6 g/dL (ref 6.0–8.3)

## 2012-03-21 LAB — TSH: TSH: 1.14 u[IU]/mL (ref 0.35–5.50)

## 2012-03-21 LAB — POCT INR: INR: 3.1

## 2012-03-21 MED ORDER — POLYETHYLENE GLYCOL 3350 17 GM/SCOOP PO POWD: 17.0000 g | Freq: Every day | ORAL | Status: AC | PRN

## 2012-03-21 MED ORDER — GABAPENTIN 100 MG PO CAPS: 100.0000 mg | ORAL_CAPSULE | Freq: Every day | ORAL | Status: DC

## 2012-03-21 NOTE — Patient Instructions (Addendum)
Take miralax once a day if needed for constipation.  Take up to 3 of the gabapentin at night for the foot tingling.   See Mitchell Farrell about your referral before you leave today. Go to the lab on the way out.  We'll contact you with your lab report. Take care.  Plan on recheck in 6 months.  30 min visit.

## 2012-03-21 NOTE — Progress Notes (Signed)
Larey Seat, seen at ER with likely nonacute compression fx in L spine and pain in L lower back and leg.  L leg is weak at baseline, worse since his mobility has been further restricted.  Some pain relief with hydrocodone but dec in BM frequency.  Still with flatus.  No new symptoms at this point.  His home bound (except for MD visits, with great effort) due to fall risk, leg weakness, w/c bound.  Family is asking about options.  Needing more care at home as he can do less of ADLs on his own.    AF, due for labs.  Compliant with coumadin.  No bleeding, no CP.    L foot pain continues episodically at night, some better with neurontin.  Tolerated well but w/o resolution of discomfort.  Located between L 1st and 2nd toe.   Meds, vitals, and allergies reviewed.   ROS: See HPI.  Otherwise, noncontributory.  nad ncat Mmm IRR, not tachy ctab abd soft Ext with trace edema No foot rash or lesions L leg weak for quad testing and foot plantar/dorsiflexion Back w/o midline pain but L paraspinal muscles are ttp

## 2012-03-21 NOTE — Patient Instructions (Signed)
Continue current dose, check in 4 weeks  

## 2012-03-22 ENCOUNTER — Encounter: Payer: Self-pay | Admitting: *Deleted

## 2012-03-22 DIAGNOSIS — M549 Dorsalgia, unspecified: Secondary | ICD-10-CM | POA: Insufficient documentation

## 2012-03-22 NOTE — Assessment & Plan Note (Signed)
Improved with pain meds.  Add on bowel regimen.  Will get home health to see and consider SW consult for placement if he doesn't thrive at home.  He and family agree.  >25 min spent with face to face with patient, >50% counseling and/or coordinating care

## 2012-03-22 NOTE — Assessment & Plan Note (Signed)
Increase gabapentin and call back prn.

## 2012-03-22 NOTE — Assessment & Plan Note (Signed)
Would likely need another round of PT at home.  See above re: homebound status.  Has improved with PT prev.

## 2012-03-22 NOTE — Assessment & Plan Note (Signed)
Continue anticoagulation.  TSH wnl.  See notes on labs.

## 2012-03-24 NOTE — ED Provider Notes (Signed)
History     CSN: [id number]  Arrival date & time 03/18/12  [place]   First MD Initiated Contact with Patient 03/18/12 2057      Chief Complaint  Patient presents with  . Back Pain  . Fall    HPI Pt fell at home today and onto buttocks and hit back on bath tub; pt now c/o lower back pain; pt denies LOC; pt unable to stand after fall  Past Medical History  Diagnosis Date  . Arthritis   . GERD (gastroesophageal reflux disease)   . Thyroid disease   . Hip fracture 11/11  . AF (atrial fibrillation)   . Left leg weakness     longstanding and of unclear origin  . Chronic kidney disease     baseline Cr ~1.6    Past Surgical History  Procedure Date  . Hip surgery     Family History  Problem Relation Age of Onset  . Hypertension Mother   . Throat cancer Father     History  Substance Use Topics  . Smoking status: Never Smoker   . Smokeless tobacco: Never Used  . Alcohol Use: No      Review of Systems  All other systems reviewed and are negative.    Allergies  Review of patient's allergies indicates no known allergies.  Home Medications   Current Outpatient Rx  Name Route Sig Dispense Refill  . CALCIUM CARBONATE ANTACID 500 MG PO CHEW Oral Chew 1 tablet by mouth 2 (two) times daily.      Marland Kitchen LEVOTHYROXINE SODIUM 88 MCG PO TABS Oral Take 1 tablet (88 mcg total) by mouth daily. 90 tablet 3  . WARFARIN SODIUM 5 MG PO TABS Oral Take 2.5 mg by mouth every evening.    Marland Kitchen GABAPENTIN 100 MG PO CAPS Oral Take 1-3 capsules (100-300 mg total) by mouth at bedtime. 90 capsule 12  . HYDROCODONE-ACETAMINOPHEN 5-325 MG PO TABS Oral Take 1 tablet by mouth every 4 (four) hours as needed for pain. 30 tablet 0  . POLYETHYLENE GLYCOL 3350 PO POWD Oral Take 17 g by mouth daily as needed (for constipation). 3350 g 1    BP 133/85  Pulse 73  Temp 98 F (36.7 C) (Oral)  Resp 18  SpO2 96%  Physical Exam  Nursing note and vitals reviewed. Constitutional: He is oriented to person,  place, and time. He appears well-developed and well-nourished. No distress.  HENT:  Head: Normocephalic and atraumatic.  Eyes: Pupils are equal, round, and reactive to light.  Neck: Normal range of motion.  Cardiovascular: Normal rate and intact distal pulses.   Pulmonary/Chest: No respiratory distress.  Abdominal: Normal appearance. He exhibits no distension.  Musculoskeletal:       Lumbar back: He exhibits tenderness.       Back:  Neurological: He is alert and oriented to person, place, and time. No cranial nerve deficit.  Skin: Skin is warm and dry. No rash noted.  Psychiatric: He has a normal mood and affect. His behavior is normal.    ED Course  Procedures (including critical care time)  LUMBAR SPINE - COMPLETE 4+ VIEW  Comparison: None.  Findings: Minimal convex right lumbar spine curvature. Mild osteopenia. Prior right proximal femoral fixation. Phleboliths in the pelvis. L3-L5 is poorly evaluated on the lateral secondary curvature and extent of spondylosis. No gross vertebral body height loss. Mild L1 vertebral body height loss anteriorly. No canal compromise. Suggestion of sclerosis. Severe lower lumbar spondylosis. Multilevel facet arthropathy. Prominent  gas-filled bowel loops in the upper abdomen are nonspecific.  IMPRESSION:  1. Spinal curvature and underlying spondylosis degrades evaluation of the lower lumbar spine. 2. Mild L1 compression deformity. Suspect at least partially chronic. No canal compromise.  SACRUM AND COCCYX - 2+ VIEW  Comparison: Lumbar spine films same date.Hip films of 08/10/2010.  Findings: Osteopenia. Prior proximal right femoral fixation. Lower lumbar spondylosis. Sacroiliac joints are symmetric. Mild left hip osteoarthritis. Irregularity of the inferior coccyx is favored to be degenerative. No displaced fractures seen.  IMPRESSION: No acute osseous abnormality.       1. Compression fracture       MDM           Nelia Shi, MD 03/24/12 1530

## 2012-03-27 ENCOUNTER — Telehealth: Payer: Self-pay

## 2012-03-27 NOTE — Telephone Encounter (Signed)
Denny Peon, PT with [facility] request verbal order for continuing services with frequency of 2 x a week for 1 week, 3 x a week for 3 weeks and 2 x a week for 2 weeks.Please advise.

## 2012-03-27 NOTE — Telephone Encounter (Signed)
Please give verbal order for continuing services with frequency of 2 x a week for 1 week, 3 x a week for 3 weeks and 2 x a week for 2 weeks

## 2012-03-28 NOTE — Telephone Encounter (Signed)
LMOVM in detail.  

## 2012-04-01 ENCOUNTER — Telehealth: Payer: Self-pay

## 2012-04-01 NOTE — Telephone Encounter (Signed)
Please give order for 1 more visit.  Thanks.

## 2012-04-01 NOTE — Telephone Encounter (Signed)
Vinie Sill social worker with [facility] saw pt on 03/29/12 to gather info for respite care at a facility; needs one more visit to present possible options. Please advise.

## 2012-04-02 NOTE — Telephone Encounter (Signed)
LMOVM of Mitchell Farrell's cell phone.

## 2012-04-18 ENCOUNTER — Ambulatory Visit: Payer: Federal, State, Local not specified - PPO

## 2012-05-02 ENCOUNTER — Telehealth: Payer: Self-pay

## 2012-05-02 ENCOUNTER — Ambulatory Visit (INDEPENDENT_AMBULATORY_CARE_PROVIDER_SITE_OTHER): Payer: Medicare Other | Admitting: Family Medicine

## 2012-05-02 DIAGNOSIS — Z5181 Encounter for therapeutic drug level monitoring: Secondary | ICD-10-CM

## 2012-05-02 DIAGNOSIS — Z7901 Long term (current) use of anticoagulants: Secondary | ICD-10-CM

## 2012-05-02 DIAGNOSIS — I4891 Unspecified atrial fibrillation: Secondary | ICD-10-CM

## 2012-05-02 LAB — POCT INR: INR: 2.9

## 2012-05-02 NOTE — Telephone Encounter (Signed)
Please give verbal order to continue PT 2 X a week for 3 more weeks; for adjusting balance,strengthening and home safety. Thanks.

## 2012-05-02 NOTE — Patient Instructions (Signed)
Continue 2.5 mg daily, recheck 4 weeks 

## 2012-05-02 NOTE — Telephone Encounter (Signed)
Left detailed message on VM.

## 2012-05-02 NOTE — Telephone Encounter (Signed)
Mitchell Farrell, physical therapist with Mitchell [facility]rell left v/m requesting verbal order to continue PT 2 X a week for 3 more weeks; for adjusting balance,strengthening and home safety. Pt has made progress; more independent and safety with balance upon transfers and using rolling walker.Please advise.

## 2012-05-20 ENCOUNTER — Telehealth: Payer: Self-pay

## 2012-05-20 NOTE — Telephone Encounter (Signed)
Family has requested respite care for pt to be admitted to assisted living where his wife is also. Maryruth Hancock will bring FL2 to Dr Para March for review and signature.

## 2012-05-20 NOTE — Telephone Encounter (Signed)
I'll address the FL2 when it arrives.

## 2012-05-30 ENCOUNTER — Telehealth: Payer: Self-pay | Admitting: *Deleted

## 2012-05-30 ENCOUNTER — Ambulatory Visit (INDEPENDENT_AMBULATORY_CARE_PROVIDER_SITE_OTHER): Payer: Federal, State, Local not specified - PPO | Admitting: Family Medicine

## 2012-05-30 DIAGNOSIS — Z5181 Encounter for therapeutic drug level monitoring: Secondary | ICD-10-CM

## 2012-05-30 DIAGNOSIS — Z7901 Long term (current) use of anticoagulants: Secondary | ICD-10-CM

## 2012-05-30 DIAGNOSIS — I4891 Unspecified atrial fibrillation: Secondary | ICD-10-CM

## 2012-05-30 NOTE — Patient Instructions (Signed)
Continue 2.5 mg daily, recheck 4 weeks 

## 2012-05-30 NOTE — Telephone Encounter (Signed)
Please put him on RN schedule for PPD on a day other than Thursday.  Thanks.

## 2012-05-30 NOTE — Telephone Encounter (Signed)
Appt scheduled

## 2012-05-30 NOTE — Telephone Encounter (Signed)
Pt is here for protime, wants to have ppd done in order to stay in a nursing facility. I advised that I would send note to Dr to get the ok and schedule his appt for it since we cannot do them on Thursdays.

## 2012-06-05 ENCOUNTER — Ambulatory Visit (INDEPENDENT_AMBULATORY_CARE_PROVIDER_SITE_OTHER): Payer: Federal, State, Local not specified - PPO | Admitting: *Deleted

## 2012-06-05 DIAGNOSIS — Z111 Encounter for screening for respiratory tuberculosis: Secondary | ICD-10-CM

## 2012-06-07 LAB — TB SKIN TEST: TB Skin Test: NEGATIVE

## 2012-07-04 ENCOUNTER — Ambulatory Visit (INDEPENDENT_AMBULATORY_CARE_PROVIDER_SITE_OTHER): Payer: Federal, State, Local not specified - PPO | Admitting: Family Medicine

## 2012-07-04 ENCOUNTER — Encounter: Payer: Self-pay | Admitting: Family Medicine

## 2012-07-04 VITALS — BP 136/68 | HR 53 | Temp 98.4°F | Wt 156.0 lb

## 2012-07-04 DIAGNOSIS — I4891 Unspecified atrial fibrillation: Secondary | ICD-10-CM

## 2012-07-04 DIAGNOSIS — R682 Dry mouth, unspecified: Secondary | ICD-10-CM

## 2012-07-04 DIAGNOSIS — Z5181 Encounter for therapeutic drug level monitoring: Secondary | ICD-10-CM

## 2012-07-04 DIAGNOSIS — M79673 Pain in unspecified foot: Secondary | ICD-10-CM

## 2012-07-04 DIAGNOSIS — Z7901 Long term (current) use of anticoagulants: Secondary | ICD-10-CM

## 2012-07-04 DIAGNOSIS — K117 Disturbances of salivary secretion: Secondary | ICD-10-CM

## 2012-07-04 DIAGNOSIS — M79609 Pain in unspecified limb: Secondary | ICD-10-CM

## 2012-07-04 DIAGNOSIS — Z23 Encounter for immunization: Secondary | ICD-10-CM

## 2012-07-04 NOTE — Progress Notes (Signed)
L foot pain at night.  H/o L foot weakness after presumed CVA.  Gabapentin wasn't helping and kerasal cream does help some. No rash.  No trauma.  Asking for advice.    Coughing some recently, no sputum.  Still with some hard stools, irregular BMs.  Asking about using metamucil.  He does have stool softener, used 1-2 x per week.  Occ dry mouth.  No fevers.  Has been taking tums frequently, >10 per day.  Needs [facility] forms filled out.    Meds, vitals, and allergies reviewed.   ROS: See HPI.  Otherwise, noncontributory.  nad ncat Poor dentition but OP w/o acute changes IRR, not tachy ctab abd soft, not ttp Ext with trace edema L foot with weakness at baseline, no skin breakdown or ulceration In wheelchair

## 2012-07-04 NOTE — Patient Instructions (Addendum)
Cut back on the tums, keep using the cream, and take metamucil as needed.  I'll work on your forms.   Go see Terri in the lab on the way out.  Glad to see you.

## 2012-07-04 NOTE — Progress Notes (Signed)
Protime 1.9. No missed doses or diet changes. Spoke with Dr. Para March. He advised leave dosing as is and recheck in 2 weeks. Patient notified and appt scheduled. kad

## 2012-07-05 ENCOUNTER — Encounter: Payer: Self-pay | Admitting: Family Medicine

## 2012-07-05 NOTE — Assessment & Plan Note (Signed)
Likely exacerbated by frequent tums.  Would taper and f/u prn.

## 2012-07-05 NOTE — Assessment & Plan Note (Signed)
I'll fill out the forms.  See scanned.  >25 min spent with face to face with patient, >50% counseling and/or coordinating care

## 2012-07-05 NOTE — Assessment & Plan Note (Signed)
Improved with kerasal cream.  This is likely neuropathic pain and if this cream helps then I would continue,  D/w pt.

## 2012-07-18 ENCOUNTER — Ambulatory Visit (INDEPENDENT_AMBULATORY_CARE_PROVIDER_SITE_OTHER): Payer: Federal, State, Local not specified - PPO | Admitting: Family Medicine

## 2012-07-18 DIAGNOSIS — Z7901 Long term (current) use of anticoagulants: Secondary | ICD-10-CM

## 2012-07-18 DIAGNOSIS — Z5181 Encounter for therapeutic drug level monitoring: Secondary | ICD-10-CM

## 2012-07-18 DIAGNOSIS — I4891 Unspecified atrial fibrillation: Secondary | ICD-10-CM

## 2012-07-18 NOTE — Patient Instructions (Signed)
Continue current dose, check in 4 weeks  

## 2012-08-15 ENCOUNTER — Ambulatory Visit (INDEPENDENT_AMBULATORY_CARE_PROVIDER_SITE_OTHER): Payer: Federal, State, Local not specified - PPO | Admitting: Family Medicine

## 2012-08-15 DIAGNOSIS — Z7901 Long term (current) use of anticoagulants: Secondary | ICD-10-CM

## 2012-08-15 DIAGNOSIS — Z5181 Encounter for therapeutic drug level monitoring: Secondary | ICD-10-CM

## 2012-08-15 DIAGNOSIS — I4891 Unspecified atrial fibrillation: Secondary | ICD-10-CM

## 2012-08-15 LAB — POCT INR: INR: 3.3

## 2012-08-15 NOTE — Patient Instructions (Addendum)
Hold 1 dose, then resume  2.5 mg daily, recheck 2  weeks

## 2012-08-29 ENCOUNTER — Other Ambulatory Visit: Payer: Self-pay

## 2012-08-29 ENCOUNTER — Ambulatory Visit (INDEPENDENT_AMBULATORY_CARE_PROVIDER_SITE_OTHER): Payer: Federal, State, Local not specified - PPO | Admitting: Family Medicine

## 2012-08-29 DIAGNOSIS — Z7901 Long term (current) use of anticoagulants: Secondary | ICD-10-CM

## 2012-08-29 DIAGNOSIS — I4891 Unspecified atrial fibrillation: Secondary | ICD-10-CM

## 2012-08-29 DIAGNOSIS — Z5181 Encounter for therapeutic drug level monitoring: Secondary | ICD-10-CM

## 2012-08-29 LAB — POCT INR: INR: 3

## 2012-08-29 MED ORDER — WARFARIN SODIUM 5 MG PO TABS: 2.5000 mg | ORAL_TABLET | Freq: Every evening | ORAL | Status: DC

## 2012-08-29 NOTE — Telephone Encounter (Signed)
pts son left note pt needs refill on warfarin to [facility] had already requested refill; spoke with Homero Fellers rx ready for pick up. Patient notified as instructed by telephone v/m rx ready for pick up at [facility].

## 2012-08-29 NOTE — Patient Instructions (Signed)
continue 2.5 mg daily, recheck 4 weeks 

## 2012-09-23 ENCOUNTER — Encounter: Payer: Self-pay | Admitting: Family Medicine

## 2012-09-23 ENCOUNTER — Ambulatory Visit (INDEPENDENT_AMBULATORY_CARE_PROVIDER_SITE_OTHER): Payer: Federal, State, Local not specified - PPO | Admitting: Family Medicine

## 2012-09-23 ENCOUNTER — Encounter: Payer: Self-pay | Admitting: *Deleted

## 2012-09-23 VITALS — BP 112/70 | HR 63 | Temp 98.3°F | Wt 165.8 lb

## 2012-09-23 DIAGNOSIS — L908 Other atrophic disorders of skin: Secondary | ICD-10-CM

## 2012-09-23 DIAGNOSIS — I4891 Unspecified atrial fibrillation: Secondary | ICD-10-CM

## 2012-09-23 DIAGNOSIS — Z7901 Long term (current) use of anticoagulants: Secondary | ICD-10-CM

## 2012-09-23 DIAGNOSIS — Z5181 Encounter for therapeutic drug level monitoring: Secondary | ICD-10-CM

## 2012-09-23 DIAGNOSIS — R7989 Other specified abnormal findings of blood chemistry: Secondary | ICD-10-CM

## 2012-09-23 LAB — HEPATIC FUNCTION PANEL
AST: 18 U/L (ref 0–37)
Bilirubin, Direct: 0 mg/dL (ref 0.0–0.3)
Total Bilirubin: 0.5 mg/dL (ref 0.3–1.2)

## 2012-09-23 LAB — POCT INR: INR: 4.1

## 2012-09-23 NOTE — Assessment & Plan Note (Signed)
Resolved on recheck.  See notes on labs.

## 2012-09-23 NOTE — Assessment & Plan Note (Signed)
On foot.  Use doughnut pad for protect and this should resolve.  It is only a few mm in diameter, <<1cm.  Use topical prep for dry skin on legs, see instructions.

## 2012-09-23 NOTE — Progress Notes (Signed)
Dry itchy skin on legs and arms.  No trauma.  No clear trigger known, other than possibly from cold weather.   H/o abnormal LFTs and due for recheck.  No abd pain and feeling well.   R 1st MT with superficial/dorsal irritation and he needs this checked.  His shoe had rubbed, according to pt and family.   AF, doing well, no CP, not SOB.  Due for INR.  No bleeding.  No diet or med changes.    PMH and SH reviewed  ROS: See HPI, otherwise noncontributory.  Meds, vitals, and allergies reviewed.   nad ncat Poor dentition Neck supple IRR, not tachy ctab abd soft, not ttp Ext with trace edema Dry skin noted on the B legs R 1st MT with superficial/dorsal irritation but DP pulse intact and no deep ulceration on the foot

## 2012-09-23 NOTE — Assessment & Plan Note (Signed)
Skip today's dose and restart 2.5 mg a day, recheck in 1 week in coumadin clinic.  He agrees.

## 2012-09-23 NOTE — Patient Instructions (Addendum)
Go to the lab on the way out.  We'll contact you with your lab report. Continue with the coumadin and schedule a check up in 6 months.  Take care.  We can do labs at the visit in 6 months if needed.   1. When you get out of the bath, then use regular lotion on your legs and any itchy spots.  2. If you continue to have itching then use plain vaseline.   3. If the itching still continues, change to OTC 1% hydrocortisone.    You can back down as you improve.   Get a doughnut shaped cushion for the irritated area on your foot.   Don't take the coumadin tonight.  Then restart with 2.5mg  a day.  Recheck in 1 week with the coumadin clinic.   Take care.

## 2012-09-26 ENCOUNTER — Ambulatory Visit: Payer: Federal, State, Local not specified - PPO

## 2012-10-03 ENCOUNTER — Ambulatory Visit: Payer: Federal, State, Local not specified - PPO

## 2012-10-24 ENCOUNTER — Ambulatory Visit (INDEPENDENT_AMBULATORY_CARE_PROVIDER_SITE_OTHER): Payer: Federal, State, Local not specified - PPO | Admitting: General Practice

## 2012-10-24 DIAGNOSIS — I4891 Unspecified atrial fibrillation: Secondary | ICD-10-CM

## 2012-10-24 LAB — POCT INR: INR: 2.8

## 2012-11-05 ENCOUNTER — Telehealth: Payer: Self-pay | Admitting: Family Medicine

## 2012-11-05 NOTE — Telephone Encounter (Signed)
Patient Information:  Caller Name: Mitchell Farrell  Phone: [phone]  Patient: Mitchell Farrell  Gender: Male  DOB: Oct 27, 1918  Age: 79 Years  PCP: Crawford Givens Clelia Croft) [facility])  Office Follow Up:  Does the office need to follow up with this patient?: No  Instructions For The Office: N/A  RN Note:  Cause of skin tears of right lower leg unknown; suspects it is from scratching leg. Declined to scheduled appointment until speaks with grandson who will drive to office.  States will call back to scheduler for appointment.  Symptoms  Reason For Call & Symptoms: Called regarding resolving skin tears on lateral right calf. Notes two areas have dime sized scabs and 3rd spot is healing. No current bleeding; bleeding stops easily with applied pressure.  Reviewed Health History In EMR: Yes  Reviewed Medications In EMR: Yes  Reviewed Allergies In EMR: Yes  Reviewed Surgeries / Procedures: Yes  Date of Onset of Symptoms: 10/22/2012  Treatments Tried: vaseline,  Assured body powder  Treatments Tried Worked: No  Guideline(s) Used:  Skin Injury  Disposition Per Guideline:   See Today or Tomorrow in Office  Reason For Disposition Reached:   No tetanus booster in > 5 years (Or greater than 10 years for clean wounds)  Advice Given:  Bleeding  : Apply direct pressure for 10 minutes with a sterile gauze to stop any bleeding.  Cleaning the Wound:  Wash the wound with soap and water for 5 minutes.  For any dirt, scrub gently with a wash cloth.  For any bleeding, apply direct pressure with a sterile gauze or clean cloth for 10 minutes.  Antibiotic Ointment  Apply an Antibiotic Ointment (e.g., OTC Bacitracin), covered by a Band-Aid or dressing. Change daily or if it becomes wet.  Call Back If:  Looks infected (pus, redness, increasing tenderness)  Doesn't heal within 10 days  You become worse.  RN Overrode Recommendation:  Follow Up With Office Later  Will call back after checking when  can get ride to office.

## 2012-11-08 ENCOUNTER — Telehealth: Payer: Self-pay | Admitting: Family Medicine

## 2012-11-08 ENCOUNTER — Ambulatory Visit (INDEPENDENT_AMBULATORY_CARE_PROVIDER_SITE_OTHER): Payer: Federal, State, Local not specified - PPO | Admitting: *Deleted

## 2012-11-08 DIAGNOSIS — Z23 Encounter for immunization: Secondary | ICD-10-CM

## 2012-11-08 NOTE — Telephone Encounter (Signed)
Pt walked in.  No fever.  BP 120/80.  Has some clear blisters on R shin.  No FCNAVD.  No pain, no itching.    nad 9 lesions - 3 roofed and 6 unroofed. All about 1cm across.  No purulent discharge.  Trace edema.  No fluctuant mass.    A/P blisters.  Doesn't appear infected. Would use topical neosporin with nonstick bandage. Wrapped in roll gauze. Elevate leg.  Call back with update on Monday.  Pt and family agree.

## 2012-11-21 ENCOUNTER — Ambulatory Visit: Payer: Federal, State, Local not specified - PPO

## 2012-11-25 ENCOUNTER — Encounter: Payer: Self-pay | Admitting: Family Medicine

## 2012-11-25 ENCOUNTER — Ambulatory Visit (INDEPENDENT_AMBULATORY_CARE_PROVIDER_SITE_OTHER): Payer: Federal, State, Local not specified - PPO | Admitting: General Practice

## 2012-11-25 ENCOUNTER — Ambulatory Visit (INDEPENDENT_AMBULATORY_CARE_PROVIDER_SITE_OTHER): Payer: Federal, State, Local not specified - PPO | Admitting: Family Medicine

## 2012-11-25 VITALS — BP 112/56 | HR 80 | Temp 98.1°F

## 2012-11-25 DIAGNOSIS — I4891 Unspecified atrial fibrillation: Secondary | ICD-10-CM

## 2012-11-25 DIAGNOSIS — L918 Other hypertrophic disorders of the skin: Secondary | ICD-10-CM

## 2012-11-25 NOTE — Patient Instructions (Addendum)
Keep using the same bandages and we'll be in touch.

## 2012-11-25 NOTE — Progress Notes (Signed)
Blisters on r lateral shin continue.  Episodically erupting.  No FCNAVD.  No bleeding.  He keeps them covered with nonstick bandage and neosporin.   Meds, vitals, and allergies reviewed.   ROS: See HPI.  Otherwise, noncontributory.  nad In wheelchair No acute skin changes except for continued superficial small bullous changes (max ~2cm) with clear fluid, of various ages on R lateral shin.  Prev lesions have healed over.  Doesn't appear infected, no purulent discharge.  No oral or palmar lesions.

## 2012-11-26 ENCOUNTER — Telehealth: Payer: Self-pay | Admitting: Family Medicine

## 2012-11-26 ENCOUNTER — Encounter: Payer: Self-pay | Admitting: Family Medicine

## 2012-11-26 NOTE — Assessment & Plan Note (Signed)
Unclear source.  Doesn't have any edema or other obvious source.  Rewrapped today.  Would continue as is for now.  Will offer wound referral if continued.

## 2012-11-26 NOTE — Telephone Encounter (Signed)
Patient's daughter advised.  

## 2012-11-26 NOTE — Telephone Encounter (Signed)
Call pt/family.  I would continue as is for now with the wound care.  I still don't know why this is happening.  If continued, we can set him up with the wound center.  Let me known and I can put in the referral if needed.  Thanks.

## 2012-12-23 ENCOUNTER — Ambulatory Visit (INDEPENDENT_AMBULATORY_CARE_PROVIDER_SITE_OTHER): Payer: Federal, State, Local not specified - PPO | Admitting: General Practice

## 2012-12-23 DIAGNOSIS — Z5181 Encounter for therapeutic drug level monitoring: Secondary | ICD-10-CM

## 2012-12-23 LAB — POCT INR: INR: 4

## 2012-12-25 ENCOUNTER — Telehealth: Payer: Self-pay | Admitting: Family Medicine

## 2012-12-25 DIAGNOSIS — R238 Other skin changes: Secondary | ICD-10-CM

## 2012-12-25 NOTE — Telephone Encounter (Signed)
Referral is in.

## 2012-12-25 NOTE — Telephone Encounter (Signed)
Patients daughter Britta Mccreedy called requesting a referral to the Wound Center as his leg is not healing. It very red and every other day a new blister comes up on it. Please call Britta Mccreedy back when appt can be made, [phone].

## 2013-01-02 ENCOUNTER — Encounter: Payer: Self-pay | Admitting: Cardiothoracic Surgery

## 2013-01-02 ENCOUNTER — Encounter: Payer: Self-pay | Admitting: Nurse Practitioner

## 2013-01-07 ENCOUNTER — Encounter: Payer: Self-pay | Admitting: Cardiothoracic Surgery

## 2013-01-07 ENCOUNTER — Encounter: Payer: Self-pay | Admitting: Nurse Practitioner

## 2013-01-15 ENCOUNTER — Other Ambulatory Visit: Payer: Self-pay | Admitting: *Deleted

## 2013-01-15 MED ORDER — LEVOTHYROXINE SODIUM 88 MCG PO TABS: 88.0000 ug | ORAL_TABLET | Freq: Every day | ORAL | Status: DC

## 2013-01-20 ENCOUNTER — Ambulatory Visit (INDEPENDENT_AMBULATORY_CARE_PROVIDER_SITE_OTHER): Payer: Federal, State, Local not specified - PPO | Admitting: General Practice

## 2013-01-20 ENCOUNTER — Ambulatory Visit (INDEPENDENT_AMBULATORY_CARE_PROVIDER_SITE_OTHER): Payer: Federal, State, Local not specified - PPO | Admitting: Family Medicine

## 2013-01-20 ENCOUNTER — Encounter: Payer: Self-pay | Admitting: Family Medicine

## 2013-01-20 VITALS — BP 116/66 | HR 84 | Temp 98.2°F

## 2013-01-20 DIAGNOSIS — Z5181 Encounter for therapeutic drug level monitoring: Secondary | ICD-10-CM

## 2013-01-20 DIAGNOSIS — I4891 Unspecified atrial fibrillation: Secondary | ICD-10-CM

## 2013-01-20 DIAGNOSIS — R609 Edema, unspecified: Secondary | ICD-10-CM

## 2013-01-20 DIAGNOSIS — L908 Other atrophic disorders of skin: Secondary | ICD-10-CM

## 2013-01-20 DIAGNOSIS — Z7901 Long term (current) use of anticoagulants: Secondary | ICD-10-CM

## 2013-01-20 LAB — COMPREHENSIVE METABOLIC PANEL
AST: 16 U/L (ref 0–37)
Alkaline Phosphatase: 66 U/L (ref 39–117)
BUN: 14 mg/dL (ref 6–23)
Creatinine, Ser: 1.5 mg/dL (ref 0.4–1.5)

## 2013-01-20 LAB — MICROALBUMIN / CREATININE URINE RATIO
Creatinine,U: 146.8 mg/dL
Microalb Creat Ratio: 0.4 mg/g (ref 0.0–30.0)
Microalb, Ur: 0.6 mg/dL (ref 0.0–1.9)

## 2013-01-20 NOTE — Progress Notes (Signed)
Has seen the wound clinic re: leg blisters.  They rec'd eval for kidney and protein testing, along with CHF.  D/w pt about renal function- prev GFR was as expected at his age- and protein levels with edema.  He understood. He isn't SOB.  Still with R>L leg edema, in stockings now. Has f/u with [place] v[facility] and vein pending for later in 4/14.  Overall his clothes are fitting more loosely over the years but we don't have a weight today due to his w/c status.   Has some irritation on his buttocks treated with desitin.    Meds, vitals, and allergies reviewed.   ROS: See HPI.  Otherwise, noncontributory.  nad ncat IRR,not tachy ctab abd soft Ext with compression stockings on

## 2013-01-20 NOTE — Patient Instructions (Addendum)
Go to the lab on the way out.  We'll contact you with your lab report. Don't change your meds for now.   Wash the left leg every night with dove (but keep the right leg dry) and then moisturize every night with the gold bond cream. Use the desitin 3 times a day.  Elevate your legs. Wear the stockings all day- put them on before you get out of bed.   Try to get some meat with each meal.  Take care.

## 2013-01-21 NOTE — Assessment & Plan Note (Signed)
Improved per wound clinic notes.  Continue per their plan.  >25 min spent with face to face with patient, >50% counseling and/or coordinating care in total.

## 2013-01-21 NOTE — Assessment & Plan Note (Signed)
Would recheck the echo given the persistent BLE edema.  See notes on labs.

## 2013-02-06 ENCOUNTER — Other Ambulatory Visit (INDEPENDENT_AMBULATORY_CARE_PROVIDER_SITE_OTHER): Payer: Federal, State, Local not specified - PPO

## 2013-02-06 ENCOUNTER — Ambulatory Visit (INDEPENDENT_AMBULATORY_CARE_PROVIDER_SITE_OTHER): Payer: Federal, State, Local not specified - PPO | Admitting: General Practice

## 2013-02-06 ENCOUNTER — Encounter: Payer: Self-pay | Admitting: Family Medicine

## 2013-02-06 ENCOUNTER — Other Ambulatory Visit: Payer: Self-pay

## 2013-02-06 DIAGNOSIS — Z7901 Long term (current) use of anticoagulants: Secondary | ICD-10-CM

## 2013-02-06 DIAGNOSIS — I739 Peripheral vascular disease, unspecified: Secondary | ICD-10-CM | POA: Insufficient documentation

## 2013-02-06 DIAGNOSIS — I4891 Unspecified atrial fibrillation: Secondary | ICD-10-CM

## 2013-02-06 DIAGNOSIS — Z5181 Encounter for therapeutic drug level monitoring: Secondary | ICD-10-CM

## 2013-02-07 ENCOUNTER — Encounter: Payer: Self-pay | Admitting: Family Medicine

## 2013-02-07 DIAGNOSIS — I5020 Unspecified systolic (congestive) heart failure: Secondary | ICD-10-CM | POA: Insufficient documentation

## 2013-02-10 ENCOUNTER — Ambulatory Visit: Payer: Federal, State, Local not specified - PPO

## 2013-02-14 ENCOUNTER — Ambulatory Visit (INDEPENDENT_AMBULATORY_CARE_PROVIDER_SITE_OTHER): Payer: Federal, State, Local not specified - PPO | Admitting: Family Medicine

## 2013-02-14 ENCOUNTER — Encounter: Payer: Self-pay | Admitting: Family Medicine

## 2013-02-14 VITALS — BP 114/60 | HR 50 | Temp 97.9°F

## 2013-02-14 DIAGNOSIS — I5020 Unspecified systolic (congestive) heart failure: Secondary | ICD-10-CM

## 2013-02-14 DIAGNOSIS — Z9181 History of falling: Secondary | ICD-10-CM

## 2013-02-14 DIAGNOSIS — I4891 Unspecified atrial fibrillation: Secondary | ICD-10-CM

## 2013-02-14 MED ORDER — ATORVASTATIN CALCIUM 10 MG PO TABS: 10.0000 mg | ORAL_TABLET | Freq: Every day | ORAL | Status: DC

## 2013-02-14 NOTE — Patient Instructions (Addendum)
Hold onto the cholesterol medicine for now.   See Shirlee Limerick about your referral before you leave today. Take care.  Glad to see you.

## 2013-02-16 NOTE — Assessment & Plan Note (Signed)
Given the likely significant anterior and anteroseptal hypokinesis, I presumed that he had MI at some point.  We didn't have a reason to be aggressive in his w/u up to this point.  This was discussed and agreed upon by patient and family.  I wouldn't add ACE/BB due to BP and pulse now.  Continue coumadin, wouldn't add ASA for now.  We discussed statin and cards referral for additional input.  He'll hold the statin for now and discuss with cards.  I would like to know from cards what could reasonably be done to improve his situation at this point.  We'll get PT for the leg weakness.  His lipids were prev controlled, labs reviewed. >25 min spent with face to face with patient, >50% counseling and/or coordinating care.

## 2013-02-16 NOTE — Progress Notes (Signed)
79 y/o male that by the time he had established with me already had h/o AF on coumadin and unilateral leg weakness.  No typical CP in the interval but he had some episodes of heartburn that responded to TUMS.  More recently with BLE edema.  Vascular w/u showed moderate PVD w/o targets for intervention.  He has been going to the wound center about skin breakdown on the BLE edema, this is improved.  We got an echo done with the results listed.  He came in with family to discuss today.  See plan.  His edema is improved with leg elevation.  He continues to have trouble with L leg weakness (prev in PT at home) and some chest wall pain that is positional, ie worse with certain positions sitting and then getting up.   Meds, vitals, and allergies reviewed.   ROS: See HPI.  Otherwise, noncontributory.  nad ncat Poor dentition.  IRR, not tachy ctab Chest wall not ttp but he can have some L chest wall pain with repositioning in the chair.  abd soft Minimal edema on the BLE edema R leg wrapped.

## 2013-02-19 ENCOUNTER — Telehealth: Payer: Self-pay

## 2013-02-19 ENCOUNTER — Telehealth: Payer: Self-pay | Admitting: General Practice

## 2013-02-19 NOTE — Telephone Encounter (Signed)
Patient called to inform coumadin clinic that patient is starting doxycycline today.  This RN gave patient's dtr dosing instructions for coumadin and scheduled an appointment to check INR on Monday 5/19.

## 2013-02-19 NOTE — Telephone Encounter (Signed)
Mitchell Farrell request call back; dermatologist gave pt doxycycline hyclate 100 mg taking one tab twice a day. Pharmacist recommended Mitchell Farrell call to see if OK to take with Coumadin.(pt has not taken Doxycycline yet). Mitchell Farrell will call Mitchell Farrell to verify.

## 2013-02-24 ENCOUNTER — Ambulatory Visit (INDEPENDENT_AMBULATORY_CARE_PROVIDER_SITE_OTHER): Payer: Federal, State, Local not specified - PPO | Admitting: General Practice

## 2013-02-24 DIAGNOSIS — Z5181 Encounter for therapeutic drug level monitoring: Secondary | ICD-10-CM

## 2013-02-24 DIAGNOSIS — Z7901 Long term (current) use of anticoagulants: Secondary | ICD-10-CM

## 2013-02-24 DIAGNOSIS — I4891 Unspecified atrial fibrillation: Secondary | ICD-10-CM

## 2013-02-24 LAB — POCT INR: INR: 2.5

## 2013-02-25 DIAGNOSIS — R269 Unspecified abnormalities of gait and mobility: Secondary | ICD-10-CM

## 2013-02-25 DIAGNOSIS — M6281 Muscle weakness (generalized): Secondary | ICD-10-CM

## 2013-02-25 DIAGNOSIS — I739 Peripheral vascular disease, unspecified: Secondary | ICD-10-CM

## 2013-02-25 DIAGNOSIS — I5022 Chronic systolic (congestive) heart failure: Secondary | ICD-10-CM

## 2013-02-25 DIAGNOSIS — G609 Hereditary and idiopathic neuropathy, unspecified: Secondary | ICD-10-CM

## 2013-02-25 DIAGNOSIS — L97809 Non-pressure chronic ulcer of other part of unspecified lower leg with unspecified severity: Secondary | ICD-10-CM

## 2013-03-06 ENCOUNTER — Ambulatory Visit: Payer: Federal, State, Local not specified - PPO

## 2013-03-11 ENCOUNTER — Ambulatory Visit (INDEPENDENT_AMBULATORY_CARE_PROVIDER_SITE_OTHER): Payer: Federal, State, Local not specified - PPO | Admitting: Cardiovascular Disease

## 2013-03-11 ENCOUNTER — Encounter: Payer: Self-pay | Admitting: Cardiovascular Disease

## 2013-03-11 VITALS — BP 100/62 | HR 104

## 2013-03-11 DIAGNOSIS — Z7901 Long term (current) use of anticoagulants: Secondary | ICD-10-CM

## 2013-03-11 DIAGNOSIS — I4891 Unspecified atrial fibrillation: Secondary | ICD-10-CM

## 2013-03-11 DIAGNOSIS — I5022 Chronic systolic (congestive) heart failure: Secondary | ICD-10-CM

## 2013-03-11 DIAGNOSIS — I739 Peripheral vascular disease, unspecified: Secondary | ICD-10-CM

## 2013-03-11 DIAGNOSIS — I509 Heart failure, unspecified: Secondary | ICD-10-CM

## 2013-03-11 DIAGNOSIS — R002 Palpitations: Secondary | ICD-10-CM

## 2013-03-11 MED ORDER — CARVEDILOL 3.125 MG PO TABS: 3.1250 mg | ORAL_TABLET | Freq: Two times a day (BID) | ORAL | Status: DC

## 2013-03-11 NOTE — Assessment & Plan Note (Signed)
Still a candidate for this especially in light of previous stroke.  F/U clinic and monthly INR checks

## 2013-03-11 NOTE — Progress Notes (Signed)
Patient ID: Mitchell Farrell, male   DOB: [birth year], 79 y.o.   MRN: [id number] 79 yo referred for abnormal echo  He moved from [place] to be with family a few years ago  Apparent stroke with left sided weakness getting PT.  ON chronic coumadin for afib.  No cardiac symptoms Mild dependant edema with skin ulcers on LLE.  Not on diuretic  Edema goes away with elevation Does get out of wheel chair around house but not very far No palpitations chest pain syncope falling or dyspnea.  No bleeding complications on coumadin  Study Conclusions 02/06/13  Left ventricle: The cavity size was normal. There was mild concentric hypertrophy. Systolic function was moderately to severely reduced. The estimated ejection fraction was in the range of 30% to 35%. There is likely significant anterior and anteroseptal hypokinesis. Regional wall motion abnormalities cannot be excluded. Doppler parameters are consistent with abnormal left ventricular relaxation (grade 1 diastolic dysfunction).  ROS: Denies fever, malais, weight loss, blurry vision, decreased visual acuity, cough, sputum, SOB, hemoptysis, pleuritic pain, palpitaitons, heartburn, abdominal pain, melena, lower extremity edema, claudication, or rash.  All other systems reviewed and negative   General: Affect appropriate Elderly black male HEENT: normal Neck supple with no adenopathy JVP normal no bruits no thyromegaly Lungs clear with no wheezing and good diaphragmatic motion Heart:  S1/S2 no murmur,rub, gallop or click PMI normal Abdomen: benighn, BS positve, no tenderness, no AAA no bruit.  No HSM or HJR Distal pulses intact with no bruits No edema Neuro non-focal  Left sided weakness  Skin warm and dry No muscular weakness  Medications Current Outpatient Prescriptions  Medication Sig Dispense Refill  . atorvastatin (LIPITOR) 10 MG tablet Take 1 tablet (10 mg total) by mouth daily.  90 tablet  3  . levothyroxine (SYNTHROID, LEVOTHROID) 88 MCG tablet  Take 1 tablet (88 mcg total) by mouth daily.  90 tablet  0  . ranitidine (ZANTAC) 150 MG capsule Take 150 mg by mouth 2 (two) times daily as needed.      . warfarin (COUMADIN) 5 MG tablet Take 0.5 tablets (2.5 mg total) by mouth every evening. Or as directed  90 tablet  1   No current facility-administered medications for this visit.    Allergies Review of patient's allergies indicates no known allergies.  Family History: Family History  Problem Relation Age of Onset  . Hypertension Mother   . Throat cancer Father     Social History: History   Social History  . Marital Status: Single    Spouse Name: N/A    Number of Children: N/A  . Years of Education: N/A   Occupational History  . Not on file.   Social History Main Topics  . Smoking status: Never Smoker   . Smokeless tobacco: Never Used  . Alcohol Use: No  . Drug Use: No  . Sexually Active: Not on file   Other Topics Concern  . Not on file   Social History Narrative   [employer] fan   Wife is demented and in [place] in [place]   Family rotates shifts to stay with patient in 1 story home, has a ramp for wheelchair   Uses wheelchair for any transport that isn't short/easy.      Electrocardiogram: 03/11/2013  Afib rate 104  PVC;s    Assessment and Plan

## 2013-03-11 NOTE — Assessment & Plan Note (Signed)
Rate control is poor. RWMA on echo suggests CAD.  Also has PVC;s  I think low dose coreg would be more important than ACE as initial medicine to start.

## 2013-03-11 NOTE — Assessment & Plan Note (Signed)
Given age and lack of symptoms not much to be done.  Systolic BP is soft.  Needs coreg for rate control of afib.  If he tolerates this can consider adding low dose ACE in a month  Will have him f/u with primary for this.  Does not need diuretic at this time Would only use PRN in future not daily

## 2013-03-11 NOTE — Patient Instructions (Addendum)
Your physician recommends that you schedule a follow-up appointment in: AS NEEDED Your physician has recommended you make the following change in your medication: START CARVEDILOL 3.125 MG  1 TAB TWICE DAILY   AND FOLLOW UP  DR Para March IN  A MONTH   APPOINTMENT  MADE FOR  04-14-13 AT  10:30 WITH DR  Para March

## 2013-03-11 NOTE — Assessment & Plan Note (Signed)
Palpable DP bilaterally no rest pain No claudication due to limited mobiility Continue PT

## 2013-03-21 ENCOUNTER — Telehealth: Payer: Self-pay

## 2013-03-21 NOTE — Telephone Encounter (Signed)
Please give verbal order to continue home PT 2 x a week for 4 more weeks for gait training, balance, home safety and strengthening. Thanks .

## 2013-03-21 NOTE — Telephone Encounter (Signed)
Mitchell Farrell PT with [facility] left v/m requesting verbal order to continue home PT 2 x a week for 4 more weeks for gait training, balance, home safety and strengthening.Please advise.

## 2013-03-24 ENCOUNTER — Ambulatory Visit (INDEPENDENT_AMBULATORY_CARE_PROVIDER_SITE_OTHER): Payer: Federal, State, Local not specified - PPO | Admitting: Family Medicine

## 2013-03-24 DIAGNOSIS — I4891 Unspecified atrial fibrillation: Secondary | ICD-10-CM

## 2013-03-24 DIAGNOSIS — Z7901 Long term (current) use of anticoagulants: Secondary | ICD-10-CM

## 2013-03-24 DIAGNOSIS — Z5181 Encounter for therapeutic drug level monitoring: Secondary | ICD-10-CM

## 2013-03-24 LAB — POCT INR: INR: 3.6

## 2013-03-24 NOTE — Telephone Encounter (Signed)
Left detailed message on voicemail of Clydie Braun PT with [place].  Message forwarded to [facility] for [place].

## 2013-04-01 ENCOUNTER — Telehealth: Payer: Self-pay

## 2013-04-01 NOTE — Telephone Encounter (Signed)
Baruch Gouty PT with [facility] request AFO ankle and foot orthotic to [facility] fax # [phone]; Needs written order for AFO for foot and ankle weakness. Erin request cb.

## 2013-04-02 NOTE — Telephone Encounter (Signed)
Please print off the order for AFO ankle and foot orthotic needed for foot and ankle weakness.  I'll sign it. Thanks.

## 2013-04-03 ENCOUNTER — Encounter: Payer: Self-pay | Admitting: *Deleted

## 2013-04-03 NOTE — Telephone Encounter (Signed)
rx done. Thanks.  

## 2013-04-03 NOTE — Telephone Encounter (Signed)
Written order faxed.

## 2013-04-14 ENCOUNTER — Other Ambulatory Visit: Payer: Self-pay | Admitting: *Deleted

## 2013-04-14 ENCOUNTER — Encounter: Payer: Self-pay | Admitting: Family Medicine

## 2013-04-14 ENCOUNTER — Ambulatory Visit (INDEPENDENT_AMBULATORY_CARE_PROVIDER_SITE_OTHER): Payer: Federal, State, Local not specified - PPO | Admitting: Family Medicine

## 2013-04-14 VITALS — BP 96/58 | HR 60 | Temp 98.1°F | Wt 143.5 lb

## 2013-04-14 DIAGNOSIS — M79609 Pain in unspecified limb: Secondary | ICD-10-CM

## 2013-04-14 DIAGNOSIS — Z5181 Encounter for therapeutic drug level monitoring: Secondary | ICD-10-CM

## 2013-04-14 DIAGNOSIS — Z7901 Long term (current) use of anticoagulants: Secondary | ICD-10-CM

## 2013-04-14 DIAGNOSIS — I4891 Unspecified atrial fibrillation: Secondary | ICD-10-CM

## 2013-04-14 DIAGNOSIS — M79672 Pain in left foot: Secondary | ICD-10-CM

## 2013-04-14 LAB — POCT INR: INR: 1.8

## 2013-04-14 MED ORDER — LEVOTHYROXINE SODIUM 88 MCG PO TABS: 88.0000 ug | ORAL_TABLET | Freq: Every day | ORAL | Status: DC

## 2013-04-14 NOTE — Assessment & Plan Note (Signed)
Unclear source, concern for nerve compression that is transient.  This happens a few times in a week, is brief.  Would try tylenol before bed and see if that helps.  This could be peripheral, ie from toe deviation at baseline, or possibly from proximal source.  Would try tylenol before doing anything else.  This doesn't appear vascular.  Pt agrees.

## 2013-04-14 NOTE — Progress Notes (Signed)
Taking coreg w/o complications.  Not lightheaded on standing.  No CP.  No palpitations.  Cards note reviewed with patient and family today.   L 1st and 2nd toe pain, burning pain, at night, middle of the night.  Wakes him up.  No trauma.  Not painful during the day.  When painful, toes have normal inspection, ie no redness.  Not tender now.   Meds, vitals, and allergies reviewed.   ROS: See HPI.  Otherwise, noncontributory.  nad Mm IRR, not tachy L foot with lateral deviation of great toe but no synovitis or erythema.   DP pulse intact Prev lesions on R leg improved, healing, dressed and covered with compression stocking.

## 2013-04-14 NOTE — Patient Instructions (Addendum)
Take 2 regular tylenol at night before you got to bed.  That may help with the pain at night.  Give me an update in about 1 week- see if you have less episodes, shorter episodes, less pain.  Thanks.

## 2013-04-14 NOTE — Assessment & Plan Note (Signed)
[name] tolerating BB and not lightheaded.  He has walked short distances with PT and been able to continue the medicine.  If tachy palpitations or lightheaded, he'll notify us and we can adjust med as needed.  He agrees.

## 2013-04-17 ENCOUNTER — Ambulatory Visit: Payer: Federal, State, Local not specified - PPO

## 2013-04-21 ENCOUNTER — Telehealth: Payer: Self-pay | Admitting: *Deleted

## 2013-04-21 NOTE — Telephone Encounter (Signed)
Erroneous encounter

## 2013-04-23 ENCOUNTER — Telehealth: Payer: Self-pay

## 2013-04-23 ENCOUNTER — Other Ambulatory Visit: Payer: Self-pay | Admitting: Family Medicine

## 2013-04-23 NOTE — Telephone Encounter (Signed)
Opened in error

## 2013-04-23 NOTE — Telephone Encounter (Signed)
Mitchell Farrell said pt's foot pain is much better due to Tylenol. In last week 1 episode of pain which lasted for 15 min period of time. Pt's pain level is 0 now. The severe pain is gone but occasional minor pain on and off. Pt said he is feeling good.

## 2013-04-23 NOTE — Telephone Encounter (Signed)
Noted, thanks, continue as is.

## 2013-05-07 DIAGNOSIS — M216X9 Other acquired deformities of unspecified foot: Secondary | ICD-10-CM

## 2013-05-07 DIAGNOSIS — I5022 Chronic systolic (congestive) heart failure: Secondary | ICD-10-CM

## 2013-05-07 DIAGNOSIS — M6281 Muscle weakness (generalized): Secondary | ICD-10-CM

## 2013-05-07 DIAGNOSIS — G609 Hereditary and idiopathic neuropathy, unspecified: Secondary | ICD-10-CM

## 2013-05-07 DIAGNOSIS — R269 Unspecified abnormalities of gait and mobility: Secondary | ICD-10-CM

## 2013-05-12 ENCOUNTER — Ambulatory Visit (INDEPENDENT_AMBULATORY_CARE_PROVIDER_SITE_OTHER): Payer: Federal, State, Local not specified - PPO | Admitting: Family Medicine

## 2013-05-12 DIAGNOSIS — Z7901 Long term (current) use of anticoagulants: Secondary | ICD-10-CM

## 2013-05-12 DIAGNOSIS — I4891 Unspecified atrial fibrillation: Secondary | ICD-10-CM

## 2013-05-12 DIAGNOSIS — Z5181 Encounter for therapeutic drug level monitoring: Secondary | ICD-10-CM

## 2013-05-12 LAB — POCT INR: INR: 2.8

## 2013-06-16 ENCOUNTER — Ambulatory Visit (INDEPENDENT_AMBULATORY_CARE_PROVIDER_SITE_OTHER): Payer: Federal, State, Local not specified - PPO | Admitting: Family Medicine

## 2013-06-16 DIAGNOSIS — I4891 Unspecified atrial fibrillation: Secondary | ICD-10-CM

## 2013-06-16 DIAGNOSIS — Z5181 Encounter for therapeutic drug level monitoring: Secondary | ICD-10-CM

## 2013-06-16 DIAGNOSIS — Z7901 Long term (current) use of anticoagulants: Secondary | ICD-10-CM

## 2013-06-16 LAB — POCT INR: INR: 4

## 2013-06-30 ENCOUNTER — Ambulatory Visit (INDEPENDENT_AMBULATORY_CARE_PROVIDER_SITE_OTHER): Payer: Federal, State, Local not specified - PPO | Admitting: Family Medicine

## 2013-06-30 DIAGNOSIS — I4891 Unspecified atrial fibrillation: Secondary | ICD-10-CM

## 2013-06-30 DIAGNOSIS — Z7901 Long term (current) use of anticoagulants: Secondary | ICD-10-CM

## 2013-06-30 DIAGNOSIS — Z5181 Encounter for therapeutic drug level monitoring: Secondary | ICD-10-CM

## 2013-07-28 ENCOUNTER — Ambulatory Visit (INDEPENDENT_AMBULATORY_CARE_PROVIDER_SITE_OTHER): Payer: Federal, State, Local not specified - PPO | Admitting: Family Medicine

## 2013-07-28 DIAGNOSIS — Z5181 Encounter for therapeutic drug level monitoring: Secondary | ICD-10-CM

## 2013-07-28 DIAGNOSIS — Z7901 Long term (current) use of anticoagulants: Secondary | ICD-10-CM

## 2013-07-28 DIAGNOSIS — Z23 Encounter for immunization: Secondary | ICD-10-CM

## 2013-07-28 DIAGNOSIS — I4891 Unspecified atrial fibrillation: Secondary | ICD-10-CM

## 2013-08-25 ENCOUNTER — Ambulatory Visit (INDEPENDENT_AMBULATORY_CARE_PROVIDER_SITE_OTHER): Payer: Federal, State, Local not specified - PPO | Admitting: Family Medicine

## 2013-08-25 DIAGNOSIS — Z5181 Encounter for therapeutic drug level monitoring: Secondary | ICD-10-CM

## 2013-08-25 DIAGNOSIS — Z7901 Long term (current) use of anticoagulants: Secondary | ICD-10-CM

## 2013-08-25 DIAGNOSIS — I4891 Unspecified atrial fibrillation: Secondary | ICD-10-CM

## 2013-09-22 ENCOUNTER — Ambulatory Visit (INDEPENDENT_AMBULATORY_CARE_PROVIDER_SITE_OTHER): Payer: Federal, State, Local not specified - PPO | Admitting: Family Medicine

## 2013-09-22 DIAGNOSIS — Z7901 Long term (current) use of anticoagulants: Secondary | ICD-10-CM

## 2013-09-22 DIAGNOSIS — I4891 Unspecified atrial fibrillation: Secondary | ICD-10-CM

## 2013-09-22 DIAGNOSIS — Z5181 Encounter for therapeutic drug level monitoring: Secondary | ICD-10-CM

## 2013-10-16 ENCOUNTER — Other Ambulatory Visit: Payer: Self-pay | Admitting: Family Medicine

## 2013-10-20 ENCOUNTER — Ambulatory Visit (INDEPENDENT_AMBULATORY_CARE_PROVIDER_SITE_OTHER): Payer: Federal, State, Local not specified - PPO | Admitting: Family Medicine

## 2013-10-20 DIAGNOSIS — Z5181 Encounter for therapeutic drug level monitoring: Secondary | ICD-10-CM

## 2013-10-20 DIAGNOSIS — I4891 Unspecified atrial fibrillation: Secondary | ICD-10-CM

## 2013-10-20 DIAGNOSIS — Z7901 Long term (current) use of anticoagulants: Secondary | ICD-10-CM

## 2013-10-20 LAB — POCT INR: INR: 2.3

## 2013-12-01 ENCOUNTER — Ambulatory Visit (INDEPENDENT_AMBULATORY_CARE_PROVIDER_SITE_OTHER): Payer: Federal, State, Local not specified - PPO | Admitting: Family Medicine

## 2013-12-01 DIAGNOSIS — I4891 Unspecified atrial fibrillation: Secondary | ICD-10-CM

## 2013-12-01 DIAGNOSIS — Z7901 Long term (current) use of anticoagulants: Secondary | ICD-10-CM

## 2013-12-01 DIAGNOSIS — Z5181 Encounter for therapeutic drug level monitoring: Secondary | ICD-10-CM | POA: Insufficient documentation

## 2013-12-01 LAB — POCT INR: INR: 3.5

## 2014-01-12 ENCOUNTER — Ambulatory Visit (INDEPENDENT_AMBULATORY_CARE_PROVIDER_SITE_OTHER): Payer: Federal, State, Local not specified - PPO | Admitting: Family Medicine

## 2014-01-12 DIAGNOSIS — Z7901 Long term (current) use of anticoagulants: Secondary | ICD-10-CM

## 2014-01-12 DIAGNOSIS — I4891 Unspecified atrial fibrillation: Secondary | ICD-10-CM

## 2014-01-12 DIAGNOSIS — Z5181 Encounter for therapeutic drug level monitoring: Secondary | ICD-10-CM

## 2014-01-12 LAB — POCT INR: INR: 3

## 2014-01-22 ENCOUNTER — Other Ambulatory Visit: Payer: Self-pay | Admitting: Family Medicine

## 2014-01-22 NOTE — Telephone Encounter (Signed)
Electronic refill request. Last OV 04/14/13.  Med not on current meds list but on historical.  Please advise.

## 2014-01-23 NOTE — Telephone Encounter (Signed)
Get him set up for this summer as a f/u, 49min visit.  Thanks.  rx sent.

## 2014-01-23 NOTE — Telephone Encounter (Signed)
Appointment scheduled.

## 2014-02-17 ENCOUNTER — Inpatient Hospital Stay (HOSPITAL_COMMUNITY)
Admission: EM | Admit: 2014-02-17 | Discharge: 2014-02-24 | DRG: 552 | Disposition: A | Discharge status: Skilled Nursing Facility | Payer: MEDICARE | Attending: Internal Medicine | Admitting: Internal Medicine

## 2014-02-17 ENCOUNTER — Encounter (HOSPITAL_COMMUNITY): Payer: Self-pay | Admitting: Emergency Medicine

## 2014-02-17 ENCOUNTER — Emergency Department (HOSPITAL_COMMUNITY): Payer: MEDICARE

## 2014-02-17 DIAGNOSIS — Z8673 Personal history of transient ischemic attack (TIA), and cerebral infarction without residual deficits: Secondary | ICD-10-CM

## 2014-02-17 DIAGNOSIS — I5020 Unspecified systolic (congestive) heart failure: Secondary | ICD-10-CM

## 2014-02-17 DIAGNOSIS — E039 Hypothyroidism, unspecified: Secondary | ICD-10-CM | POA: Diagnosis present

## 2014-02-17 DIAGNOSIS — I509 Heart failure, unspecified: Secondary | ICD-10-CM | POA: Diagnosis present

## 2014-02-17 DIAGNOSIS — K219 Gastro-esophageal reflux disease without esophagitis: Secondary | ICD-10-CM | POA: Diagnosis present

## 2014-02-17 DIAGNOSIS — N189 Chronic kidney disease, unspecified: Secondary | ICD-10-CM | POA: Diagnosis present

## 2014-02-17 DIAGNOSIS — I5022 Chronic systolic (congestive) heart failure: Secondary | ICD-10-CM | POA: Diagnosis present

## 2014-02-17 DIAGNOSIS — M48061 Spinal stenosis, lumbar region without neurogenic claudication: Secondary | ICD-10-CM | POA: Diagnosis not present

## 2014-02-17 DIAGNOSIS — R531 Weakness: Secondary | ICD-10-CM

## 2014-02-17 DIAGNOSIS — I639 Cerebral infarction, unspecified: Secondary | ICD-10-CM | POA: Diagnosis present

## 2014-02-17 DIAGNOSIS — I739 Peripheral vascular disease, unspecified: Secondary | ICD-10-CM | POA: Diagnosis present

## 2014-02-17 DIAGNOSIS — N183 Chronic kidney disease, stage 3 unspecified: Secondary | ICD-10-CM | POA: Diagnosis present

## 2014-02-17 DIAGNOSIS — R29898 Other symptoms and signs involving the musculoskeletal system: Secondary | ICD-10-CM | POA: Diagnosis present

## 2014-02-17 DIAGNOSIS — Z66 Do not resuscitate: Secondary | ICD-10-CM | POA: Diagnosis present

## 2014-02-17 DIAGNOSIS — Z8249 Family history of ischemic heart disease and other diseases of the circulatory system: Secondary | ICD-10-CM

## 2014-02-17 DIAGNOSIS — Z800 Family history of malignant neoplasm of digestive organs: Secondary | ICD-10-CM

## 2014-02-17 DIAGNOSIS — Z7189 Other specified counseling: Secondary | ICD-10-CM

## 2014-02-17 DIAGNOSIS — Z79899 Other long term (current) drug therapy: Secondary | ICD-10-CM

## 2014-02-17 DIAGNOSIS — I4891 Unspecified atrial fibrillation: Secondary | ICD-10-CM | POA: Diagnosis present

## 2014-02-17 DIAGNOSIS — Z7901 Long term (current) use of anticoagulants: Secondary | ICD-10-CM

## 2014-02-17 DIAGNOSIS — I480 Paroxysmal atrial fibrillation: Secondary | ICD-10-CM | POA: Diagnosis present

## 2014-02-17 DIAGNOSIS — Z602 Problems related to living alone: Secondary | ICD-10-CM

## 2014-02-17 DIAGNOSIS — M1990 Unspecified osteoarthritis, unspecified site: Secondary | ICD-10-CM

## 2014-02-17 DIAGNOSIS — N179 Acute kidney failure, unspecified: Secondary | ICD-10-CM | POA: Diagnosis present

## 2014-02-17 DIAGNOSIS — R109 Unspecified abdominal pain: Secondary | ICD-10-CM

## 2014-02-17 LAB — CBC
HCT: 42 % (ref 39.0–52.0)
Hemoglobin: 13.2 g/dL (ref 13.0–17.0)
MCH: 27.2 pg (ref 26.0–34.0)
MCHC: 31.4 g/dL (ref 30.0–36.0)
MCV: 86.6 fL (ref 78.0–100.0)
PLATELETS: 158 10*3/uL (ref 150–400)
RBC: 4.85 MIL/uL (ref 4.22–5.81)
RDW: 15 % (ref 11.5–15.5)
WBC: 5.9 10*3/uL (ref 4.0–10.5)

## 2014-02-17 LAB — DIFFERENTIAL
BASOS ABS: 0 10*3/uL (ref 0.0–0.1)
BASOS PCT: 0 % (ref 0–1)
EOS ABS: 0 10*3/uL (ref 0.0–0.7)
Eosinophils Relative: 1 % (ref 0–5)
Lymphocytes Relative: 12 % (ref 12–46)
Lymphs Abs: 0.7 10*3/uL (ref 0.7–4.0)
Monocytes Absolute: 0.4 10*3/uL (ref 0.1–1.0)
Monocytes Relative: 6 % (ref 3–12)
NEUTROS ABS: 4.8 10*3/uL (ref 1.7–7.7)
NEUTROS PCT: 81 % — AB (ref 43–77)

## 2014-02-17 LAB — CBG MONITORING, ED: GLUCOSE-CAPILLARY: 124 mg/dL — AB (ref 70–99)

## 2014-02-17 LAB — I-STAT CHEM 8, ED
BUN: 11 mg/dL (ref 6–23)
Calcium, Ion: 1.19 mmol/L (ref 1.13–1.30)
Chloride: 105 mEq/L (ref 96–112)
Creatinine, Ser: 1.5 mg/dL — ABNORMAL HIGH (ref 0.50–1.35)
Glucose, Bld: 100 mg/dL — ABNORMAL HIGH (ref 70–99)
HEMATOCRIT: 44 % (ref 39.0–52.0)
Hemoglobin: 15 g/dL (ref 13.0–17.0)
POTASSIUM: 3.7 meq/L (ref 3.7–5.3)
Sodium: 142 mEq/L (ref 137–147)
TCO2: 25 mmol/L (ref 0–100)

## 2014-02-17 NOTE — ED Notes (Signed)
Patient arrives from home with complaint of weakness in legs and shaking while trying to get up from a stool.

## 2014-02-17 NOTE — Code Documentation (Signed)
Patient in normal state of health this evening when around 2115 he went to the bathroom to get changed for bed and he noted worsened left leg weakness. Patient has a history of left leg weakness. Patient arrived to [facility] via private vehicle. Upon MD assessment Code Stroke called at 2330. NIH 02 for left leg weakness. Family and patient state weakness is slightly more significant than normal. INR 2.71. No acute intervention indicated, will continue to monitor.

## 2014-02-17 NOTE — ED Notes (Signed)
Per patient and family: Left leg has been weak for "a long time". Patient had difficulty lifting leg against gravity, but was able to keep leg straight after being assisted with straightening. No other neurological deficits identified in triage. Patient roomed.

## 2014-02-17 NOTE — ED Provider Notes (Signed)
CSN: [id number]     Arrival date & time 02/17/14  2222 History   First MD Initiated Contact with Patient 02/17/14 2304     Chief Complaint  Patient presents with  . Code Stroke  . Tremors  . Weakness     HPI Patient presents with acute onset weakness of his left lower extremity.  He has had a prior stroke and always has some weakness in his left lower extremity but he reports significantly more weak at this time.  He was sitting on the commode and was unable to get up because of severe weakness of the left lower extremity.  No numbness or tingling in the left lower extremity.  He denies weakness of his left upper extremity.  Denies headache.  He has had recent cataract surgery in the past 24-48 hours in his right eye.  He is on Coumadin.  He does not know what his last INR was.  Denies dizziness.  No difficulties with speech.   Past Medical History  Diagnosis Date  . Arthritis   . GERD (gastroesophageal reflux disease)   . Thyroid disease   . Hip fracture 11/11  . AF (atrial fibrillation)   . Left leg weakness     longstanding and of unclear origin, presumed CVA  . Chronic kidney disease     baseline Cr ~1.6   Past Surgical History  Procedure Laterality Date  . Hip surgery    . Eye surgery     Family History  Problem Relation Age of Onset  . Hypertension Mother   . Throat cancer Father    History  Substance Use Topics  . Smoking status: Never Smoker   . Smokeless tobacco: Never Used  . Alcohol Use: No    Review of Systems  All other systems reviewed and are negative.     Allergies  Review of patient's allergies indicates no known allergies.  Home Medications   Prior to Admission medications   Medication Sig Start Date End Date Taking? Authorizing Provider  Besifloxacin HCl (BESIVANCE) 0.6 % SUSP Place 1 drop into the right eye 2 (two) times daily.   Yes Historical Provider, MD  Bromfenac Sodium (PROLENSA) 0.07 % SOLN Place 1 drop into the right eye daily.    Yes Historical Provider, MD  carvedilol (COREG) 3.125 MG tablet Take 1 tablet (3.125 mg total) by mouth 2 (two) times daily with a meal. 03/11/13  Yes Josue Hector, MD  Difluprednate (DUREZOL) 0.05 % EMUL Place 1 drop into the right eye 2 (two) times daily.   Yes Historical Provider, MD  doxycycline (VIBRA-TABS) 100 MG tablet Take 100 mg by mouth 2 (two) times daily.   Yes Historical Provider, MD  gabapentin (NEURONTIN) 100 MG capsule Take 100-300 mg by mouth at bedtime.   Yes Historical Provider, MD  levothyroxine (SYNTHROID, LEVOTHROID) 88 MCG tablet Take 1 tablet (88 mcg total) by mouth daily. 04/14/13 04/14/14 Yes Tonia Ghent, MD  warfarin (COUMADIN) 5 MG tablet Take 2.5 mg by mouth daily.   Yes Historical Provider, MD   BP 124/56  Pulse 83  Temp(Src) 99.3 F (37.4 C) (Oral)  Resp 25  Ht 5\' 7"  (1.702 m)  Wt 160 lb (72.576 kg)  BMI 25.05 kg/m2  SpO2 98% Physical Exam  Nursing note and vitals reviewed. Constitutional: He is oriented to person, place, and time. He appears well-developed and well-nourished.  HENT:  Head: Normocephalic and atraumatic.  Eyes: EOM are normal.  Neck: Normal range of  motion.  Cardiovascular: Normal rate, regular rhythm, normal heart sounds and intact distal pulses.   Pulmonary/Chest: Effort normal and breath sounds normal. No respiratory distress.  Abdominal: Soft. He exhibits no distension. There is no tenderness.  Musculoskeletal: Normal range of motion.  No swelling of his left lower extremity.  Normal PT and DP pulses left foot.  Left lower extremity is warm and perfused  Neurological: He is alert and oriented to person, place, and time.  5 out of 5 strength in bilateral major muscle groups of upper extremities as well as right lower extremity.  Patient with 305 strength of his major muscle groups of his left lower extremity.  He cannot lift his left leg to gravity.  He is able to bring his knee up off the bed.  Skin: Skin is warm and dry.   Psychiatric: He has a normal mood and affect. Judgment normal.    ED Course  Procedures (including critical care time)  CRITICAL CARE Performed by: Hoy Morn Total critical care time: 32 Critical care time was exclusive of separately billable procedures and treating other patients. Critical care was necessary to treat or prevent imminent or life-threatening deterioration. Critical care was time spent personally by me on the following activities: development of treatment plan with patient and/or surrogate as well as nursing, discussions with consultants, evaluation of patient's response to treatment, examination of patient, obtaining history from patient or surrogate, ordering and performing treatments and interventions, ordering and review of laboratory studies, ordering and review of radiographic studies, pulse oximetry and re-evaluation of patient's condition.   Labs Review Labs Reviewed  PROTIME-INR - Abnormal; Notable for the following:    Prothrombin Time 27.8 (*)    INR 2.71 (*)    All other components within normal limits  APTT - Abnormal; Notable for the following:    aPTT 41 (*)    All other components within normal limits  DIFFERENTIAL - Abnormal; Notable for the following:    Neutrophils Relative % 81 (*)    All other components within normal limits  CBG MONITORING, ED - Abnormal; Notable for the following:    Glucose-Capillary 124 (*)    All other components within normal limits  I-STAT CHEM 8, ED - Abnormal; Notable for the following:    Creatinine, Ser 1.50 (*)    Glucose, Bld 100 (*)    All other components within normal limits  CBC  ETHANOL  COMPREHENSIVE METABOLIC PANEL  URINE RAPID DRUG SCREEN (HOSP PERFORMED)  URINALYSIS, ROUTINE W REFLEX MICROSCOPIC  HEMOGLOBIN A1C  I-STAT TROPOININ, ED    Imaging Review Ct Head Wo Contrast  02/17/2014   CLINICAL DATA:  Left leg weakness  EXAM: CT HEAD WITHOUT CONTRAST  TECHNIQUE: Contiguous axial images were  obtained from the base of the skull through the vertex without intravenous contrast.  COMPARISON:  None.  FINDINGS: The bony calvarium is intact. Chronic postsurgical changes are noted in the maxillary antra. Diffuse mucosal thickening is noted within the right maxillary antrum.  Mild atrophic changes are noted. Chronic white matter ischemic change is seen. No findings to suggest acute hemorrhage, acute infarction or space-occupying mass lesion is noted. A calcification is noted in the right half of the pons of uncertain chronicity.  IMPRESSION: Chronic changes without acute abnormality.  These results were called by telephone at the time of interpretation on 02/17/2014 at 11:59 PM to Dr. Armida Sans, who verbally acknowledged these results.   Electronically Signed   By: Linus Mako.D.  On: 02/17/2014 23:59  I personally reviewed the imaging tests through PACS system I reviewed available ER/hospitalization records through the EMR   ECG interpretation   Date: 02/18/2014  Rate: 109  Rhythm: Atrial fibrillation  QRS Axis: normal  Intervals: normal  ST/T Wave abnormalities: normal  Conduction Disutrbances: none  Narrative Interpretation:   Old EKG Reviewed: No significant changes noted     MDM   Final diagnoses:  Weakness of left leg    code stroke after my evaluation and given onset of symptoms 2 hours and 15 minutes ago of left lower extremity weakness.  He has had recent surgery in the past 24 hours and he is on Coumadin and therefore he will likely not be a candidate for TPA but I will involve the stroke team early in the management of this patient.  12:34 AM Patient is not a candidate for TPA.  Please see neurology consultation note for complete details.  Patient be admitted the hospital for ongoing stroke workup an MRI scan in the morning.  Swallow study.     Hoy Morn, MD 02/18/14 [phone]

## 2014-02-18 ENCOUNTER — Inpatient Hospital Stay (HOSPITAL_COMMUNITY): Payer: MEDICARE

## 2014-02-18 DIAGNOSIS — R29898 Other symptoms and signs involving the musculoskeletal system: Secondary | ICD-10-CM

## 2014-02-18 DIAGNOSIS — Z66 Do not resuscitate: Secondary | ICD-10-CM | POA: Diagnosis present

## 2014-02-18 DIAGNOSIS — Z8249 Family history of ischemic heart disease and other diseases of the circulatory system: Secondary | ICD-10-CM | POA: Diagnosis not present

## 2014-02-18 DIAGNOSIS — N183 Chronic kidney disease, stage 3 unspecified: Secondary | ICD-10-CM

## 2014-02-18 DIAGNOSIS — Z800 Family history of malignant neoplasm of digestive organs: Secondary | ICD-10-CM | POA: Diagnosis not present

## 2014-02-18 DIAGNOSIS — I639 Cerebral infarction, unspecified: Secondary | ICD-10-CM | POA: Diagnosis present

## 2014-02-18 DIAGNOSIS — M48061 Spinal stenosis, lumbar region without neurogenic claudication: Secondary | ICD-10-CM | POA: Diagnosis present

## 2014-02-18 DIAGNOSIS — I5022 Chronic systolic (congestive) heart failure: Secondary | ICD-10-CM | POA: Diagnosis present

## 2014-02-18 DIAGNOSIS — Z79899 Other long term (current) drug therapy: Secondary | ICD-10-CM | POA: Diagnosis not present

## 2014-02-18 DIAGNOSIS — Z8673 Personal history of transient ischemic attack (TIA), and cerebral infarction without residual deficits: Secondary | ICD-10-CM | POA: Diagnosis not present

## 2014-02-18 DIAGNOSIS — I739 Peripheral vascular disease, unspecified: Secondary | ICD-10-CM | POA: Diagnosis present

## 2014-02-18 DIAGNOSIS — I6350 Cerebral infarction due to unspecified occlusion or stenosis of unspecified cerebral artery: Secondary | ICD-10-CM

## 2014-02-18 DIAGNOSIS — Z7901 Long term (current) use of anticoagulants: Secondary | ICD-10-CM | POA: Diagnosis not present

## 2014-02-18 DIAGNOSIS — E039 Hypothyroidism, unspecified: Secondary | ICD-10-CM | POA: Diagnosis present

## 2014-02-18 DIAGNOSIS — I4891 Unspecified atrial fibrillation: Secondary | ICD-10-CM

## 2014-02-18 DIAGNOSIS — I5020 Unspecified systolic (congestive) heart failure: Secondary | ICD-10-CM

## 2014-02-18 DIAGNOSIS — K219 Gastro-esophageal reflux disease without esophagitis: Secondary | ICD-10-CM | POA: Diagnosis present

## 2014-02-18 DIAGNOSIS — Z602 Problems related to living alone: Secondary | ICD-10-CM | POA: Diagnosis not present

## 2014-02-18 DIAGNOSIS — I509 Heart failure, unspecified: Secondary | ICD-10-CM | POA: Diagnosis present

## 2014-02-18 LAB — COMPREHENSIVE METABOLIC PANEL
ALBUMIN: 3.2 g/dL — AB (ref 3.5–5.2)
ALK PHOS: 81 U/L (ref 39–117)
ALT: 10 U/L (ref 0–53)
AST: 23 U/L (ref 0–37)
BILIRUBIN TOTAL: 0.4 mg/dL (ref 0.3–1.2)
BUN: 12 mg/dL (ref 6–23)
CHLORIDE: 105 meq/L (ref 96–112)
CO2: 24 mEq/L (ref 19–32)
Calcium: 9 mg/dL (ref 8.4–10.5)
Creatinine, Ser: 1.3 mg/dL (ref 0.50–1.35)
GFR calc Af Amer: 53 mL/min — ABNORMAL LOW (ref >90)
GFR calc non Af Amer: 46 mL/min — ABNORMAL LOW (ref >90)
Glucose, Bld: 97 mg/dL (ref 70–99)
POTASSIUM: 3.9 meq/L (ref 3.7–5.3)
SODIUM: 141 meq/L (ref 137–147)
Total Protein: 7.6 g/dL (ref 6.0–8.3)

## 2014-02-18 LAB — URINALYSIS, ROUTINE W REFLEX MICROSCOPIC
BILIRUBIN URINE: NEGATIVE
Glucose, UA: NEGATIVE mg/dL
Hgb urine dipstick: NEGATIVE
Ketones, ur: NEGATIVE mg/dL
Leukocytes, UA: NEGATIVE
Nitrite: NEGATIVE
PROTEIN: NEGATIVE mg/dL
Specific Gravity, Urine: 1.021 (ref 1.005–1.030)
Urobilinogen, UA: 1 mg/dL (ref 0.0–1.0)
pH: 6 (ref 5.0–8.0)

## 2014-02-18 LAB — RAPID URINE DRUG SCREEN, HOSP PERFORMED
AMPHETAMINES: NOT DETECTED
Barbiturates: NOT DETECTED
Benzodiazepines: NOT DETECTED
Cocaine: NOT DETECTED
OPIATES: NOT DETECTED
TETRAHYDROCANNABINOL: NOT DETECTED

## 2014-02-18 LAB — ETHANOL

## 2014-02-18 LAB — HEMOGLOBIN A1C
Hgb A1c MFr Bld: 5.4 % (ref <5.7)
Mean Plasma Glucose: 108 mg/dL (ref <117)

## 2014-02-18 LAB — PROTIME-INR
INR: 2.59 — AB (ref 0.00–1.49)
INR: 2.71 — AB (ref 0.00–1.49)
PROTHROMBIN TIME: 26.9 s — AB (ref 11.6–15.2)
PROTHROMBIN TIME: 27.8 s — AB (ref 11.6–15.2)

## 2014-02-18 LAB — APTT: APTT: 41 s — AB (ref 24–37)

## 2014-02-18 LAB — I-STAT TROPONIN, ED: TROPONIN I, POC: 0.01 ng/mL (ref 0.00–0.08)

## 2014-02-18 MED ORDER — GATIFLOXACIN 0.5 % OP SOLN
1.0000 [drp] | Freq: Four times a day (QID) | OPHTHALMIC | Status: DC
  Administered 2014-02-18 – 2014-02-24 (×22): 1 [drp] via OPHTHALMIC
  Filled 2014-02-18 (×2): qty 2.5

## 2014-02-18 MED ORDER — ASPIRIN 300 MG RE SUPP
300.0000 mg | Freq: Every day | RECTAL | Status: DC
  Administered 2014-02-18 – 2014-02-19 (×2): 300 mg via RECTAL
  Filled 2014-02-18 (×3): qty 1

## 2014-02-18 MED ORDER — SODIUM CHLORIDE 0.9 % IV SOLN
INTRAVENOUS | Status: DC
  Administered 2014-02-18: 1000 mL via INTRAVENOUS
  Administered 2014-02-19: 20:00:00 via INTRAVENOUS

## 2014-02-18 MED ORDER — GABAPENTIN 100 MG PO CAPS
100.0000 mg | ORAL_CAPSULE | Freq: Every day | ORAL | Status: DC
  Administered 2014-02-18: 100 mg via ORAL
  Administered 2014-02-19 – 2014-02-23 (×5): 300 mg via ORAL
  Filled 2014-02-18 (×9): qty 3

## 2014-02-18 MED ORDER — KETOROLAC TROMETHAMINE 0.5 % OP SOLN
1.0000 [drp] | Freq: Four times a day (QID) | OPHTHALMIC | Status: DC
  Administered 2014-02-18 – 2014-02-24 (×22): 1 [drp] via OPHTHALMIC
  Filled 2014-02-18 (×2): qty 3

## 2014-02-18 MED ORDER — WARFARIN - PHYSICIAN DOSING INPATIENT: Freq: Every day | Status: DC

## 2014-02-18 MED ORDER — LEVOTHYROXINE SODIUM 88 MCG PO TABS
88.0000 ug | ORAL_TABLET | Freq: Every day | ORAL | Status: DC
  Administered 2014-02-18 – 2014-02-24 (×7): 88 ug via ORAL
  Filled 2014-02-18 (×9): qty 1

## 2014-02-18 MED ORDER — WARFARIN SODIUM 2.5 MG PO TABS
2.5000 mg | ORAL_TABLET | Freq: Every day | ORAL | Status: DC
  Administered 2014-02-18 – 2014-02-23 (×6): 2.5 mg via ORAL
  Filled 2014-02-18 (×8): qty 1

## 2014-02-18 MED ORDER — DOXYCYCLINE HYCLATE 100 MG PO TABS
100.0000 mg | ORAL_TABLET | Freq: Two times a day (BID) | ORAL | Status: DC
Start: 2014-02-18 — End: 2014-02-24
  Administered 2014-02-18 – 2014-02-24 (×13): 100 mg via ORAL
  Filled 2014-02-18 (×15): qty 1

## 2014-02-18 MED ORDER — DIFLUPREDNATE 0.05 % OP EMUL
1.0000 [drp] | Freq: Two times a day (BID) | OPHTHALMIC | Status: DC
  Administered 2014-02-18 – 2014-02-24 (×13): 1 [drp] via OPHTHALMIC
  Filled 2014-02-18: qty 5

## 2014-02-18 MED ORDER — CARVEDILOL 3.125 MG PO TABS
3.1250 mg | ORAL_TABLET | Freq: Two times a day (BID) | ORAL | Status: DC
  Administered 2014-02-18 – 2014-02-24 (×9): 3.125 mg via ORAL
  Filled 2014-02-18 (×15): qty 1

## 2014-02-18 NOTE — Progress Notes (Signed)
Stroke Team Progress Note  HISTORY Mitchell Farrell is an 80 y.o. male with a past medical history significant for atrial fibrillation on coumadin, chronic kidney disease, s/p left cataract surgery 2 days ago, arthritis, hypothyroidism, brought in by ambulance as a code stroke due to the above mentioned symptoms.  He has chronic left LE weakness but today got suddenly worse while in the bathroom and was not able to use the left leg at all. Denies pain, numbness of the left leg and also denies similar symptom in the left UE.  No HA, vertgo, double vision, difficulty swallowing, slurred speech, confusion, or language impairment.  Initial NIHSS 2.  CT brain showed no acute abnormality.  INR 2.71  Date last known well: 02/18/14  Time last known well: 915 pm  tPA Given: [id number]  NIHSS: 2 (left leg weakness)   Patient was not administered TPA secondary to delay in arrival. He was admitted to the neuro floor bed for further evaluation and treatment.  SUBJECTIVE His  Grand daughter is at the bedside.  Overall he feels his condition is gradually improving.  Patient had no apparent fall, back injury, severe back pain or radicular pain per family  OBJECTIVE Most recent Vital Signs: Filed Vitals:   02/18/14 0958 02/18/14 1236 02/18/14 1344 02/18/14 1415  BP: 112/61 121/67 121/68 108/63  Pulse: 68 47 50 62  Temp:  98.7 F (37.1 C) 98.6 F (37 C) 99.4 F (37.4 C)  TempSrc:  Oral Oral Oral  Resp: 18  18 18   Height:      Weight:      SpO2: 99%  99% 98%   CBG (last 3)   Recent Labs  02/17/14 2228  GLUCAP 124*    IV Fluid Intake:   . sodium chloride 1,000 mL (02/18/14 1419)    MEDICATIONS  . aspirin  300 mg Rectal Daily  . carvedilol  3.125 mg Oral BID WC  . Difluprednate  1 drop Right Eye BID  . doxycycline  100 mg Oral BID  . gabapentin  100-300 mg Oral QHS  . gatifloxacin  1 drop Right Eye QID  . ketorolac  1 drop Right Eye QID  . levothyroxine  88 mcg Oral QAC breakfast  .  warfarin  2.5 mg Oral q1800  . Warfarin - Physician Dosing Inpatient   Does not apply q1800   PRN:    Diet:  NPO  s Activity:  Bedrest  DVT Prophylaxis:  warfarin  CLINICALLY SIGNIFICANT STUDIES Basic Metabolic Panel:  Recent Labs Lab 02/17/14 2344 02/17/14 2355  NA 141 142  K 3.9 3.7  CL 105 105  CO2 24  --   GLUCOSE 97 100*  BUN 12 11  CREATININE 1.30 1.50*  CALCIUM 9.0  --    Liver Function Tests:  Recent Labs Lab 02/17/14 2344  AST 23  ALT 10  ALKPHOS 81  BILITOT 0.4  PROT 7.6  ALBUMIN 3.2*   CBC:  Recent Labs Lab 02/17/14 2344 02/17/14 2355  WBC 5.9  --   NEUTROABS 4.8  --   HGB 13.2 15.0  HCT 42.0 44.0  MCV 86.6  --   PLT 158  --    Coagulation:  Recent Labs Lab 02/17/14 2344 02/18/14 0305  LABPROT 27.8* 26.9*  INR 2.71* 2.59*   Cardiac Enzymes: No results found for this basename: CKTOTAL, CKMB, CKMBINDEX, TROPONINI,  in the last 168 hours Urinalysis:  Recent Labs Lab 02/18/14 [place]  6.0  GLUCOSEU NEGATIVE  HGBUR NEGATIVE  BILIRUBINUR NEGATIVE  KETONESUR NEGATIVE  PROTEINUR NEGATIVE  UROBILINOGEN 1.0  NITRITE NEGATIVE  LEUKOCYTESUR NEGATIVE   Lipid Panel    Component Value Date/Time   CHOL 136 03/16/2011 0954   TRIG 129.0 03/16/2011 0954   HDL 47.70 03/16/2011 0954   CHOLHDL 3 03/16/2011 0954   VLDL 25.8 03/16/2011 0954   LDLCALC 63 03/16/2011 0954   HgbA1C  Lab Results  Component Value Date   HGBA1C 5.4 02/17/2014    Urine Drug Screen:     Component Value Date/Time   LABOPIA NONE DETECTED 02/18/2014 0040   COCAINSCRNUR NONE DETECTED 02/18/2014 0040   LABBENZ NONE DETECTED 02/18/2014 0040   AMPHETMU NONE DETECTED 02/18/2014 0040   THCU NONE DETECTED 02/18/2014 0040   LABBARB NONE DETECTED 02/18/2014 0040    Alcohol Level:  Recent Labs Lab 02/17/14 2344  ETH <11    Ct Head Wo Contrast  02/17/2014   CLINICAL DATA:  Left leg weakness  EXAM: CT HEAD WITHOUT CONTRAST  TECHNIQUE: Contiguous  axial images were obtained from the base of the skull through the vertex without intravenous contrast.  COMPARISON:  None.  FINDINGS: The bony calvarium is intact. Chronic postsurgical changes are noted in the maxillary antra. Diffuse mucosal thickening is noted within the right maxillary antrum.  Mild atrophic changes are noted. Chronic white matter ischemic change is seen. No findings to suggest acute hemorrhage, acute infarction or space-occupying mass lesion is noted. A calcification is noted in the right half of the pons of uncertain chronicity.  IMPRESSION: Chronic changes without acute abnormality.  These results were called by telephone at the time of interpretation on 02/17/2014 at 11:59 PM to Dr. Armida Sans, who verbally acknowledged these results.   Electronically Signed   By: Inez Catalina M.D.   On: 02/17/2014 23:59       MRI of the brain  ordered  MRA of the brain  ordered  2D Echocardiogram  ordered Carotid Doppler  ordered  CXR    EKG Sinus rhythm with occasional Premature ventricular complexes Therapy Recommendations  pending  Physical Exam   Frail elderly african male not in distress.  Afebrile. Head is nontraumatic. Neck is supple without bruit. Hearing is normal. Cardiac exam soft ejection murmur.no gallop. Lungs are clear to auscultation. Distal pulses are well felt. Neurologic Examination:  Mental Status:  Alert, oriented, thought content appropriate. Speech fluent without evidence of aphasia. Able to follow 3 step commands without difficulty.  Cranial Nerves:  II: Discs flat bilaterally; Visual fields grossly normal, pupils equal, round, reactive to light and accommodation  III,IV, VI: ptosis not present, extra-ocular motions intact bilaterally  V,VII: smile symmetric, facial light touch sensation normal bilaterally  VIII: hearing normal bilaterally  IX,X: gag reflex present  XI: bilateral shoulder shrug  XII: midline tongue extension without atrophy or fasciculations   Motor:  Right : Upper extremity 5/5 Left: Upper extremity 5/5  Lower extremity 5/5 Lower extremity 2-3/5 proximally and distally Tone and bulk:normal tone throughout; no atrophy noted  Sensory: Pinprick and light touch intact throughout, bilaterally  Deep Tendon Reflexes:  1 all over  Plantars:  Right: downgoing Left: upgoing  Cerebellar:  normal finger-to-nose, normal heel-to-shin test in the right but can not perform in the left LE due to weakness.  Gait:  Not tested  ASSESSMENT Mr. Mitchell Farrell is a 80 y.o. male presenting with acute left leg weakness. Imaging pending.   .  On warfarin prior to  admission. Now on warfarin for secondary stroke prevention. Patient with resultant left leg weakness. Stroke work up underway.   Atrial Fibrillation on warfarin with therepeautic INR on admission   LDL pending      Hospital day # 1  TREATMENT/PLAN  Continue   warfarin for secondary stroke prevention.  Mobilize out of bed. PT/OT consults  Rehab versus SNF  MRI/Echo/dopplers  D/W family and answered questions    Antony Contras, MD 02/18/2014 4:35 PM       To contact Stroke Continuity provider, please refer to [email or web address] After hours, contact General Neurology

## 2014-02-18 NOTE — Progress Notes (Signed)
Pt had a spike in HR up to 142. Pt asymptomatic with stable VS. High HR was nonsustained. RN paged MD. No new orders given. Pt is currently resting in bed with call bell within reach. Will continue to monitor.   [place] Breeze

## 2014-02-18 NOTE — Progress Notes (Signed)
[facility] PROGRESS NOTE  Mitchell Farrell CWC:[id number] DOB: 1919/01/16 DOA: 02/17/2014 PCP: Mitchell Stain, MD  Assessment/Plan: 1. Suspected CVA 1. Neurology consulted 2. Head CT unremarkable 3. Was on therapeutic coumadin 4. ASA also on board 5. Stroke w/u in progress 2. Afib 1. AM notes noted - pt w/ HR into the 140's earlier this AM. Otherwise, very labile heart rates, as low as 40's overnight 2. For now, monitor closely 3. On coreg currently 3. Stage 3CKD 1. Appears stable 4. Chronic systolic chf 1. Euvolemic 5. DVT prophylaxis 1. Therapeutic coumadin  Code Status: DNR Family Communication: Pt in room Disposition Plan: Pending   Consultants:  Neurology  Procedures:    Antibiotics:   (indicate start date, and stop date if known)  HPI/Subjective: No complaints. Reports improving weakness on L side.  Objective: Filed Vitals:   02/18/14 0115 02/18/14 0250 02/18/14 0400 02/18/14 0555  BP: 146/81 123/52 116/65 102/60  Pulse: 74 113 43 51  Temp:  98.6 F (37 C) 98 F (36.7 C) 99.3 F (37.4 C)  TempSrc:  Oral Oral Oral  Resp: 22 16 14 16   Height:      Weight:      SpO2: 99% 99% 98% 98%   No intake or output data in the 24 hours ending 02/18/14 0834 Filed Weights   02/17/14 2231  Weight: 72.576 kg (160 lb)    Exam:   General:  Awake, in nad  Cardiovascular: bradycardic, s1, s2  Respiratory: normal resp effort, no wheezing or crackles  Abdomen: soft, nondistended  Musculoskeletal: perfused, no clubbing, no edema  Data Reviewed: Basic Metabolic Panel:  Recent Labs Lab 02/17/14 2344 02/17/14 2355  NA 141 142  K 3.9 3.7  CL 105 105  CO2 24  --   GLUCOSE 97 100*  BUN 12 11  CREATININE 1.30 1.50*  CALCIUM 9.0  --    Liver Function Tests:  Recent Labs Lab 02/17/14 2344  AST 23  ALT 10  ALKPHOS 81  BILITOT 0.4  PROT 7.6  ALBUMIN 3.2*   No results found for this basename: LIPASE, AMYLASE,  in the last 168 hours No  results found for this basename: AMMONIA,  in the last 168 hours CBC:  Recent Labs Lab 02/17/14 2344 02/17/14 2355  WBC 5.9  --   NEUTROABS 4.8  --   HGB 13.2 15.0  HCT 42.0 44.0  MCV 86.6  --   PLT 158  --    Cardiac Enzymes: No results found for this basename: CKTOTAL, CKMB, CKMBINDEX, TROPONINI,  in the last 168 hours BNP (last 3 results) No results found for this basename: PROBNP,  in the last 8760 hours CBG:  Recent Labs Lab 02/17/14 2228  GLUCAP 124*    No results found for this or any previous visit (from the past 240 hour(s)).   Studies: Ct Head Wo Contrast  02/17/2014   CLINICAL DATA:  Left leg weakness  EXAM: CT HEAD WITHOUT CONTRAST  TECHNIQUE: Contiguous axial images were obtained from the base of the skull through the vertex without intravenous contrast.  COMPARISON:  None.  FINDINGS: The bony calvarium is intact. Chronic postsurgical changes are noted in the maxillary antra. Diffuse mucosal thickening is noted within the right maxillary antrum.  Mild atrophic changes are noted. Chronic white matter ischemic change is seen. No findings to suggest acute hemorrhage, acute infarction or space-occupying mass lesion is noted. A calcification is noted in the right half of the pons of uncertain chronicity.  IMPRESSION: Chronic changes without acute abnormality.  These results were called by telephone at the time of interpretation on 02/17/2014 at 11:59 PM to Dr. Armida Sans, who verbally acknowledged these results.   Electronically Signed   By: Inez Catalina M.D.   On: 02/17/2014 23:59    Scheduled Meds: . aspirin  300 mg Rectal Daily  . carvedilol  3.125 mg Oral BID WC  . gabapentin  100-300 mg Oral QHS  . levothyroxine  88 mcg Oral QAC breakfast  . warfarin  2.5 mg Oral q1800  . Warfarin - Physician Dosing Inpatient   Does not apply q1800   Continuous Infusions:   Principal Problem:   Acute CVA (cerebrovascular accident) Active Problems:   AF (atrial fibrillation)    Weakness of left leg   Warfarin anticoagulation   Chronic kidney disease, stage III (moderate)   PAD (peripheral artery disease)   Systolic CHF  Time spent: 66min  [name] [name]  [facility] Pager [phone]. If 7PM-7AM, please contact night-coverage at [email or web address], password [facility] 02/18/2014, 8:34 AM  LOS: 1 day

## 2014-02-18 NOTE — Care Management Note (Addendum)
    Page 1 of 1   02/24/2014     4:46:13 PM CARE MANAGEMENT NOTE 02/24/2014  Patient:  BINH, DOTEN   Account Number:  [id number]  Date Initiated:  02/18/2014  Documentation initiated by:  Elissa Hefty  Subjective/Objective Assessment:   adm w stroke     Action/Plan:   lives w fam, pcp dr Elsie Stain   Anticipated DC Date:  02/24/2014   Anticipated DC Plan:  [place]  In-house referral  Clinical Social Worker      DC Planning Services  CM consult      Choice offered to / List presented to:             Status of service:  Completed, signed off Medicare Important Message given?   (If response is "NO", the following Medicare IM given date fields will be blank) Date Medicare IM given:   Date Additional Medicare IM given:    Discharge Disposition:  [place]  Per UR Regulation:  Reviewed for med. necessity/level of care/duration of stay  If discussed at [place] of Stay Meetings, dates discussed:   02/24/2014    Comments:  02/24/14 Ellan Lambert, RN, BSN [phone] Pt discharged to SNF today, per CSW arrangements.  02/20/14 16:00 CM reviewed; CSW arranging for SNF placement. No other CM needs were communicated.  Mariane Masters, BSN, CM [phone].

## 2014-02-18 NOTE — Consult Note (Signed)
NEURO HOSPITALIST CONSULT NOTE    Reason for Consult: CODE STROKE Referring Physician: ED    Chief Complaint: LEFT LOWER WEAKNESS  HPI:                                                                                                                                         Mitchell Farrell is an 80 y.o. male with a past medical history significant for atrial fibrillation on coumadin, chronic kidney disease, s/p left cataract surgery 2 days ago, arthritis, hypothyroidism, brought in by ambulance as a code stroke due to the above mentioned symptoms. He has chronic left LE weakness but today got suddenly worse while in the bathroom and was not able to use the left leg at all. Denies pain, numbness of the left leg and also denies similar symptom in the left UE. No HA, vertgo, double vision, difficulty swallowing, slurred speech, confusion, or language impairment.  Initial NIHSS 2. CT brain showed no acute abnormality. INR 2.71  Date last known well: 02/18/14 Time last known well: 915 pm tPA Given: [id number]:: NIHSS: 2 (left leg weakness)    Past Medical History  Diagnosis Date  . Arthritis   . GERD (gastroesophageal reflux disease)   . Thyroid disease   . Hip fracture 11/11  . AF (atrial fibrillation)   . Left leg weakness     longstanding and of unclear origin, presumed CVA  . Chronic kidney disease     baseline Cr ~1.6    Past Surgical History  Procedure Laterality Date  . Hip surgery    . Eye surgery      Family History  Problem Relation Age of Onset  . Hypertension Mother   . Throat cancer Father    Social History:  reports that he has never smoked. He has never used smokeless tobacco. He reports that he does not drink alcohol or use illicit drugs.  Allergies: No Known Allergies  Medications:                                                                                                                           I have reviewed the patient's current  medications.  ROS:  History obtained from the patient, family, and chart review.  General ROS: negative for - chills, fatigue, fever, night sweats, weight gain or weight loss Psychological ROS: negative for - behavioral disorder, hallucinations, mood swings or suicidal ideation Ophthalmic ROS: negative for - blurry vision, double vision, or loss of vision ENT ROS: negative for - epistaxis, nasal discharge, oral lesions, sore throat, tinnitus or vertigo Allergy and Immunology ROS: negative for - hives or itchy/watery eyes Hematological and Lymphatic ROS: negative for - bleeding problems, bruising or swollen lymph nodes Endocrine ROS: negative for - galactorrhea, hair pattern changes, polydipsia/polyuria or temperature intolerance Respiratory ROS: negative for - cough, hemoptysis, shortness of breath or wheezing Cardiovascular ROS: negative for - chest pain, dyspnea on exertion, edema or irregular heartbeat Gastrointestinal ROS: negative for - abdominal pain, diarrhea, hematemesis, nausea/vomiting or stool incontinence Genito-Urinary ROS: negative for - dysuria, hematuria, incontinence or urinary frequency/urgency Musculoskeletal ROS: negative for - joint swelling Neurological ROS: as noted in HPI Dermatological ROS: negative for rash and skin lesion changes  Physical exam: pleasant male in no apparent distress. Blood pressure 139/77, pulse 83, temperature 99 F (37.2 C), temperature source Oral, resp. rate 17, height 5\' 7"  (1.702 m), weight 72.576 kg (160 lb), SpO2 98.00%. Head: normocephalic. Neck: supple, no bruits, no JVD. Cardiac: no murmurs. Lungs: clear. Abdomen: soft, no tender, no mass. Extremities: no edema. CV: pulses palpable throughout  Neurologic Examination:                                                                                                       Mental Status: Alert, oriented, thought content appropriate.  Speech fluent without evidence of aphasia.  Able to follow 3 step commands without difficulty. Cranial Nerves: II: Discs flat bilaterally; Visual fields grossly normal, pupils equal, round, reactive to light and accommodation III,IV, VI: ptosis not present, extra-ocular motions intact bilaterally V,VII: smile symmetric, facial light touch sensation normal bilaterally VIII: hearing normal bilaterally IX,X: gag reflex present XI: bilateral shoulder shrug XII: midline tongue extension without atrophy or fasciculations Motor: Right : Upper extremity   5/5    Left:     Upper extremity   5/5  Lower extremity   5/5     Lower extremity   2/5 Tone and bulk:normal tone throughout; no atrophy noted Sensory: Pinprick and light touch intact throughout, bilaterally Deep Tendon Reflexes:  1 all over Plantars: Right: downgoing   Left: upgoing Cerebellar: normal finger-to-nose,  normal heel-to-shin test in the right but can not perform in the left LE due to weakness. Gait: No tested  Results for orders placed during the hospital encounter of 02/17/14 (from the past 48 hour(s))  CBG MONITORING, ED     Status: Abnormal   Collection Time    02/17/14 10:28 PM      Result Value Ref Range   Glucose-Capillary 124 (*) 70 - 99 mg/dL  CBC     Status: None   Collection Time    02/17/14 11:44 PM      Result Value Ref Range   WBC 5.9  4.0 -  10.5 K/uL   RBC 4.85  4.22 - 5.81 MIL/uL   Hemoglobin 13.2  13.0 - 17.0 g/dL   HCT 42.0  39.0 - 52.0 %   MCV 86.6  78.0 - 100.0 fL   MCH 27.2  26.0 - 34.0 pg   MCHC 31.4  30.0 - 36.0 g/dL   RDW 15.0  11.5 - 15.5 %   Platelets 158  150 - 400 K/uL  DIFFERENTIAL     Status: Abnormal   Collection Time    02/17/14 11:44 PM      Result Value Ref Range   Neutrophils Relative % 81 (*) 43 - 77 %   Neutro Abs 4.8  1.7 - 7.7 K/uL   Lymphocytes Relative 12  12 - 46 %   Lymphs Abs 0.7  0.7 - 4.0  K/uL   Monocytes Relative 6  3 - 12 %   Monocytes Absolute 0.4  0.1 - 1.0 K/uL   Eosinophils Relative 1  0 - 5 %   Eosinophils Absolute 0.0  0.0 - 0.7 K/uL   Basophils Relative 0  0 - 1 %   Basophils Absolute 0.0  0.0 - 0.1 K/uL  I-STAT TROPOININ, ED     Status: None   Collection Time    02/17/14 11:53 PM      Result Value Ref Range   Troponin i, poc 0.01  0.00 - 0.08 ng/mL   Comment 3            Comment: Due to the release kinetics of cTnI,     a negative result within the first hours     of the onset of symptoms does not rule out     myocardial infarction with certainty.     If myocardial infarction is still suspected,     repeat the test at appropriate intervals.  I-STAT CHEM 8, ED     Status: Abnormal   Collection Time    02/17/14 11:55 PM      Result Value Ref Range   Sodium 142  137 - 147 mEq/L   Potassium 3.7  3.7 - 5.3 mEq/L   Chloride 105  96 - 112 mEq/L   BUN 11  6 - 23 mg/dL   Creatinine, Ser 1.50 (*) 0.50 - 1.35 mg/dL   Glucose, Bld 100 (*) 70 - 99 mg/dL   Calcium, Ion 1.19  1.13 - 1.30 mmol/L   TCO2 25  0 - 100 mmol/L   Hemoglobin 15.0  13.0 - 17.0 g/dL   HCT 44.0  39.0 - 52.0 %   Ct Head Wo Contrast  02/17/2014   CLINICAL DATA:  Left leg weakness  EXAM: CT HEAD WITHOUT CONTRAST  TECHNIQUE: Contiguous axial images were obtained from the base of the skull through the vertex without intravenous contrast.  COMPARISON:  None.  FINDINGS: The bony calvarium is intact. Chronic postsurgical changes are noted in the maxillary antra. Diffuse mucosal thickening is noted within the right maxillary antrum.  Mild atrophic changes are noted. Chronic white matter ischemic change is seen. No findings to suggest acute hemorrhage, acute infarction or space-occupying mass lesion is noted. A calcification is noted in the right half of the pons of uncertain chronicity.  IMPRESSION: Chronic changes without acute abnormality.  These results were called by telephone at the time of  interpretation on 02/17/2014 at 11:59 PM to Dr. Armida Sans, who verbally acknowledged these results.   Electronically Signed   By: Inez Catalina M.D.   On:  02/17/2014 23:59   Assessment/Plan: 89 with chronic leg LE weakness, comes in with abrupt onset worsening left leg weakness to the point that can not use that leg. NIHSS 2. CT brain showed no acute abnormality. He is therapeutic on coumadin but acute infarct involving right ACA territory still possible.  On coumadin, INR 2.71. Patient was not treated with IV thrombolysis due to mild symptoms/INR 2.71. Admit to medicine. Complete stroke work up. Aspirin rectally until he passes swallowing evaluation. Stroke team will resume care 5/14   Dorian Pod, MD 02/18/2014, 12:03 AM [facility]

## 2014-02-18 NOTE — H&P (Signed)
PCP:   Elsie Stain, MD   Chief Complaint:  Left leg cant move  HPI: 80 yo male lives alone comes in with sudden lle weakness at 915pm.  Denies any problems with his other leg or arms.  No facial drooping or slurring.  No recent fevers.  No headache.  No n/v/d.  Since arrival to ED his left leg is starting to move some, better than before.  He had cataract surgery this past Monday to his right eye.  He is on coumadin.    Review of Systems:  Positive and negative as per HPI otherwise all other systems are negative  Past Medical History: Past Medical History  Diagnosis Date  . Arthritis   . GERD (gastroesophageal reflux disease)   . Thyroid disease   . Hip fracture 11/11  . AF (atrial fibrillation)   . Left leg weakness     longstanding and of unclear origin, presumed CVA  . Chronic kidney disease     baseline Cr ~1.6   Past Surgical History  Procedure Laterality Date  . Hip surgery    . Eye surgery      Medications: Prior to Admission medications   Medication Sig Start Date End Date Taking? Authorizing Provider  Besifloxacin HCl (BESIVANCE) 0.6 % SUSP Place 1 drop into the right eye 2 (two) times daily.   Yes Historical Provider, MD  Bromfenac Sodium (PROLENSA) 0.07 % SOLN Place 1 drop into the right eye daily.   Yes Historical Provider, MD  carvedilol (COREG) 3.125 MG tablet Take 1 tablet (3.125 mg total) by mouth 2 (two) times daily with a meal. 03/11/13  Yes Josue Hector, MD  Difluprednate (DUREZOL) 0.05 % EMUL Place 1 drop into the right eye 2 (two) times daily.   Yes Historical Provider, MD  doxycycline (VIBRA-TABS) 100 MG tablet Take 100 mg by mouth 2 (two) times daily.   Yes Historical Provider, MD  gabapentin (NEURONTIN) 100 MG capsule Take 100-300 mg by mouth at bedtime.   Yes Historical Provider, MD  levothyroxine (SYNTHROID, LEVOTHROID) 88 MCG tablet Take 1 tablet (88 mcg total) by mouth daily. 04/14/13 04/14/14 Yes Tonia Ghent, MD  warfarin (COUMADIN) 5 MG  tablet Take 2.5 mg by mouth daily.   Yes Historical Provider, MD    Allergies:  No Known Allergies  Social History:  reports that he has never smoked. He has never used smokeless tobacco. He reports that he does not drink alcohol or use illicit drugs.  Family History: Family History  Problem Relation Age of Onset  . Hypertension Mother   . Throat cancer Father     Physical Exam: Filed Vitals:   02/18/14 0008 02/18/14 0015 02/18/14 0030 02/18/14 0045  BP:  124/56 133/63 134/77  Pulse:   70 88  Temp: 99.3 F (37.4 C)     TempSrc:      Resp:  25 25 17   Height:      Weight:      SpO2:   99% 100%   General appearance: alert, cooperative and no distress Head: Normocephalic, without obvious abnormality, atraumatic Eyes: negative Nose: Nares normal. Septum midline. Mucosa normal. No drainage or sinus tenderness. Neck: no JVD and supple, symmetrical, trachea midline Lungs: clear to auscultation bilaterally Heart: regular rate and rhythm, S1, S2 normal, no murmur, click, rub or gallop Abdomen: soft, non-tender; bowel sounds normal; no masses,  no organomegaly Extremities: extremities normal, atraumatic, no cyanosis or edema Pulses: 2+ and symmetric Skin: Skin color, texture,  turgor normal. No rashes or lesions Neurologic: Grossly normal CN, 1/5 strength lle nml all others    Labs on Admission:   Recent Labs  02/17/14 2344 02/17/14 2355  NA 141 142  K 3.9 3.7  CL 105 105  CO2 24  --   GLUCOSE 97 100*  BUN 12 11  CREATININE 1.30 1.50*  CALCIUM 9.0  --     Recent Labs  02/17/14 2344  AST 23  ALT 10  ALKPHOS 81  BILITOT 0.4  PROT 7.6  ALBUMIN 3.2*    Recent Labs  02/17/14 2344 02/17/14 2355  WBC 5.9  --   NEUTROABS 4.8  --   HGB 13.2 15.0  HCT 42.0 44.0  MCV 86.6  --   PLT 158  --    Radiological Exams on Admission: Ct Head Wo Contrast  02/17/2014   CLINICAL DATA:  Left leg weakness  EXAM: CT HEAD WITHOUT CONTRAST  TECHNIQUE: Contiguous axial  images were obtained from the base of the skull through the vertex without intravenous contrast.  COMPARISON:  None.  FINDINGS: The bony calvarium is intact. Chronic postsurgical changes are noted in the maxillary antra. Diffuse mucosal thickening is noted within the right maxillary antrum.  Mild atrophic changes are noted. Chronic white matter ischemic change is seen. No findings to suggest acute hemorrhage, acute infarction or space-occupying mass lesion is noted. A calcification is noted in the right half of the pons of uncertain chronicity.  IMPRESSION: Chronic changes without acute abnormality.  These results were called by telephone at the time of interpretation on 02/17/2014 at 11:59 PM to Dr. Armida Sans, who verbally acknowledged these results.   Electronically Signed   By: Inez Catalina M.D.   On: 02/17/2014 23:59    Assessment/Plan  80 yo male with acute cva  Principal Problem:   Acute CVA (cerebrovascular accident)-  On coumadin, add aspirin.  Full cva w/u per neuro team.  Place on cva pathway.  Active Problems:  Stable unless o/w noted   AF (atrial fibrillation)   Weakness of left leg   Warfarin anticoagulation   Chronic kidney disease, stage III (moderate)   PAD (peripheral artery disease)   Systolic CHF  Pt wishes to be DNR, wishes no cpr or intubation in the future.  Tele floor.  [name] A [name] 02/18/2014, 12:50 AM

## 2014-02-18 NOTE — ED Notes (Signed)
Dr. David at bedside. 

## 2014-02-19 ENCOUNTER — Inpatient Hospital Stay (HOSPITAL_COMMUNITY): Payer: MEDICARE

## 2014-02-19 DIAGNOSIS — M6281 Muscle weakness (generalized): Secondary | ICD-10-CM

## 2014-02-19 DIAGNOSIS — Z7189 Other specified counseling: Secondary | ICD-10-CM

## 2014-02-19 DIAGNOSIS — I059 Rheumatic mitral valve disease, unspecified: Secondary | ICD-10-CM

## 2014-02-19 LAB — LIPID PANEL
CHOLESTEROL: 96 mg/dL (ref 0–200)
HDL: 56 mg/dL (ref >39)
LDL Cholesterol: 29 mg/dL (ref 0–99)
Total CHOL/HDL Ratio: 1.7 RATIO
Triglycerides: 57 mg/dL (ref <150)
VLDL: 11 mg/dL (ref 0–40)

## 2014-02-19 LAB — PROTIME-INR
INR: 2.55 — AB (ref 0.00–1.49)
Prothrombin Time: 26.6 seconds — ABNORMAL HIGH (ref 11.6–15.2)

## 2014-02-19 NOTE — Progress Notes (Signed)
*  PRELIMINARY RESULTS* Vascular Ultrasound Carotid Duplex (Doppler) has been completed.  Preliminary findings: Bilateral:  1-39% ICA stenosis.  Vertebral artery flow is antegrade.      Landry Mellow, RDMS, RVT  02/19/2014, 1:54 PM

## 2014-02-19 NOTE — Evaluation (Signed)
Occupational Therapy Evaluation Patient Details Name: Mitchell Farrell MRN: [id number] DOB: [birth year] Today's Date: 02/19/2014    History of Present Illness Pt admit to r/o CVA secondary to increasion left LE weakness.  MRI negative.  w/u for lumbar MRI to r/o compression.     Clinical Impression   PT admitted with Lt side weakness. Pt currently with functional limitiations due to the deficits listed below (see OT problem list). Pt requires (A) from family at baseline. Pt will benefit from skilled OT to increase their independence and safety with adls and balance to allow discharge SNF short term.     Follow Up Recommendations  SNF    Equipment Recommendations  Other (comment) (defer SNF)    Recommendations for Other Services       Precautions / Restrictions Precautions Precautions: Fall Restrictions Weight Bearing Restrictions: No      Mobility Bed Mobility Overal bed mobility: Needs Assistance Bed Mobility: Supine to Sit     Supine to sit: Mod assist     General bed mobility comments: requires use of bed rails   Transfers Overall transfer level: Needs assistance Equipment used: Rolling walker (2 wheeled) Transfers: Sit to/from Stand Sit to Stand: Mod assist         General transfer comment: needs against to shift weight anterior to prevent posterior LOB. pt upon static standing has kyphotic posture. Pt with knees slightly flexed. Pt stand pivot to recliner with uncontrolled descend    Balance Overall balance assessment: Needs assistance Sitting-balance support: No upper extremity supported;Feet supported Sitting balance-Leahy Scale: Fair   Postural control: Posterior lean Standing balance support: Bilateral upper extremity supported;During functional activity Standing balance-Leahy Scale: Poor Standing balance comment: Needs UE support for balance.  STrong posterior bias.                            ADL Overall ADL's : Needs  assistance/impaired     Grooming: Wash/dry face;Oral care;Sitting;Set up   Upper Body Bathing: Moderate assistance;Sitting   Lower Body Bathing: Maximal assistance;Sit to/from stand           Toilet Transfer: Moderate assistance;Stand-pivot;BSC           Functional mobility during ADLs: Moderate assistance;Rolling walker General ADL Comments: Pt supine on arrival and agreeable to OOB for grooming and bathing. Pt reports at home he has 24/7 (A) from family ( cousin or daughter) Pt states "i can't be home alone I could fall. I always have someone with me"     Vision                 Additional Comments: Pt with patch over Rt eye due to recent surg on 5.12.15. Pt reports visual deficits in Lt eye and pending surg to correct cataract.   Perception     Praxis      Pertinent Vitals/Pain VSS     Hand Dominance Right   Extremity/Trunk Assessment Upper Extremity Assessment Upper Extremity Assessment: Overall WFL for tasks assessed   Lower Extremity Assessment Lower Extremity Assessment: Defer to PT evaluation RLE Deficits / Details: grossly 3/5 hip and knee, ankle 3-/5 LLE Deficits / Details: grossly 3-/5, ankle 2+/5   Cervical / Trunk Assessment Cervical / Trunk Assessment: Kyphotic   Communication Communication Communication: No difficulties   Cognition Arousal/Alertness: Awake/alert Behavior During Therapy: WFL for tasks assessed/performed Overall Cognitive Status: Within Functional Limits for tasks assessed  General Comments       Exercises Exercises: General Lower Extremity     Shoulder Instructions      Home Living Family/patient expects to be discharged to:: Private residence Living Arrangements: Other relatives Available Help at Discharge: Family Type of Home: House Home Access: Ramped entrance     [place]: Two level;Able to live on main level with bedroom/bathroom               Home Equipment: Gilford Rile  - 2 wheels;Wheelchair - manual;Bedside commode;Grab bars - tub/shower;Hand held shower head;Tub bench   Additional Comments: pt reports navigating through home in w/c during the day but also using RW      Prior Functioning/Environment Level of Independence: Independent with assistive device(s)        Comments: used RW PTA, needed assist withshoes but did rest of bathing and dressing    OT Diagnosis: Generalized weakness   OT Problem List: Decreased strength;Decreased activity tolerance;Impaired balance (sitting and/or standing);Decreased safety awareness;Decreased knowledge of use of DME or AE   OT Treatment/Interventions: Self-care/ADL training;Therapeutic exercise;DME and/or AE instruction;Therapeutic activities;Balance training;Patient/family education    OT Goals(Current goals can be found in the care plan section) Acute Rehab OT Goals Patient Stated Goal: to go home after therapy OT Goal Formulation: With patient Time For Goal Achievement: 03/05/14 Potential to Achieve Goals: Good  OT Frequency: Min 2X/week   Barriers to D/C:            Co-evaluation              End of Session Nurse Communication: Mobility status;Precautions  Activity Tolerance: Patient tolerated treatment well Patient left: in chair;with call bell/phone within reach   Time: 0842-0909 OT Time Calculation (min): 27 min Charges:  OT General Charges $OT Visit: 1 Procedure OT Evaluation $Initial OT Evaluation Tier I: 1 Procedure OT Treatments $Self Care/Home Management : 8-22 mins G-Codes:    Peri Maris 03/21/2014, 1:24 PM Pager: [phone]

## 2014-02-19 NOTE — Progress Notes (Signed)
[facility] PROGRESS NOTE  Mitchell Farrell HWE:[id number] DOB: 28-Nov-1918 DOA: 02/17/2014 PCP: Elsie Stain, MD  Assessment/Plan: 1. L sided weakness 1. Neurology following 2. Head CT unremarkable 3. On therapeutic coumadin 4. MRI brain neg for CVA 5. Doppler and echo pending 6. Therapy eval in progress 2. Afib 1. Currently rate controlled 2. For now, monitor closely 3. On coreg per home regimen, will cont 3. Stage 3CKD 1. Appears stable 4. Chronic systolic chf 1. Euvolemic 5. DVT prophylaxis 1. Therapeutic coumadin  Code Status: DNR Family Communication: Pt in room Disposition Plan: Pending   Consultants:  Neurology  Procedures:    Antibiotics:     HPI/Subjective: No complaints  Objective: Filed Vitals:   02/18/14 1415 02/18/14 1814 02/18/14 2143 02/19/14 0634  BP: 108/63  120/65 122/53  Pulse: 62 70 59 74  Temp: 99.4 F (37.4 C)  97.7 F (36.5 C) 98.7 F (37.1 C)  TempSrc: Oral  Oral Oral  Resp: 18  18 20   Height:      Weight:      SpO2: 98%  98% 98%    Intake/Output Summary (Last 24 hours) at 02/19/14 0806 Last data filed at 02/19/14 0100  Gross per 24 hour  Intake      0 ml  Output    600 ml  Net   -600 ml   Filed Weights   02/17/14 2231  Weight: 72.576 kg (160 lb)    Exam:   General:  Awake, in nad  Cardiovascular: bradycardic, s1, s2  Respiratory: normal resp effort, no wheezing or crackles  Abdomen: soft, nondistended  Musculoskeletal: perfused, no clubbing, no edema  Data Reviewed: Basic Metabolic Panel:  Recent Labs Lab 02/17/14 2344 02/17/14 2355  NA 141 142  K 3.9 3.7  CL 105 105  CO2 24  --   GLUCOSE 97 100*  BUN 12 11  CREATININE 1.30 1.50*  CALCIUM 9.0  --    Liver Function Tests:  Recent Labs Lab 02/17/14 2344  AST 23  ALT 10  ALKPHOS 81  BILITOT 0.4  PROT 7.6  ALBUMIN 3.2*   No results found for this basename: LIPASE, AMYLASE,  in the last 168 hours No results found for this basename:  AMMONIA,  in the last 168 hours CBC:  Recent Labs Lab 02/17/14 2344 02/17/14 2355  WBC 5.9  --   NEUTROABS 4.8  --   HGB 13.2 15.0  HCT 42.0 44.0  MCV 86.6  --   PLT 158  --    Cardiac Enzymes: No results found for this basename: CKTOTAL, CKMB, CKMBINDEX, TROPONINI,  in the last 168 hours BNP (last 3 results) No results found for this basename: PROBNP,  in the last 8760 hours CBG:  Recent Labs Lab 02/17/14 2228  GLUCAP 124*    No results found for this or any previous visit (from the past 240 hour(s)).   Studies: Ct Head Wo Contrast  02/17/2014   CLINICAL DATA:  Left leg weakness  EXAM: CT HEAD WITHOUT CONTRAST  TECHNIQUE: Contiguous axial images were obtained from the base of the skull through the vertex without intravenous contrast.  COMPARISON:  None.  FINDINGS: The bony calvarium is intact. Chronic postsurgical changes are noted in the maxillary antra. Diffuse mucosal thickening is noted within the right maxillary antrum.  Mild atrophic changes are noted. Chronic white matter ischemic change is seen. No findings to suggest acute hemorrhage, acute infarction or space-occupying mass lesion is noted. A calcification is noted in the  right half of the pons of uncertain chronicity.  IMPRESSION: Chronic changes without acute abnormality.  These results were called by telephone at the time of interpretation on 02/17/2014 at 11:59 PM to Dr. Armida Sans, who verbally acknowledged these results.   Electronically Signed   By: Inez Catalina M.D.   On: 02/17/2014 23:59   Mr Jodene Nam Head Wo Contrast  02/18/2014   CLINICAL DATA:  Stroke  EXAM: MRI HEAD WITHOUT CONTRAST  MRA HEAD WITHOUT CONTRAST  TECHNIQUE: Multiplanar, multiecho pulse sequences of the brain and surrounding structures were obtained without intravenous contrast. Angiographic images of the head were obtained using MRA technique without contrast.  COMPARISON:  Prior CT from 02/17/2014.  FINDINGS: MRI HEAD FINDINGS  Study is degraded by motion  artifact.  Generalized cerebral atrophy with moderate chronic microvascular ischemic disease involving the periventricular and deep white matter both cerebral hemispheres is present. White matter changes also noted within the pons.  No focal parenchymal signal abnormality is identified. No mass lesion, midline shift, or extra-axial fluid collection. Ventricles are normal in size without evidence of hydrocephalus.  No diffusion-weighted signal abnormality is identified to suggest acute intracranial infarct. Gray-white matter differentiation is maintained. Normal flow voids are seen within the intracranial vasculature. No intracranial hemorrhage identified.  The cervicomedullary junction is normal. Pituitary gland is within normal limits. Pituitary stalk is midline. The globes and optic nerves demonstrate a normal appearance with normal signal intensity. The  The bone marrow signal intensity is normal. Calvarium is intact. Visualized upper cervical spine is within normal limits.  Scalp soft tissues are unremarkable.  Moderate mucosal thickening present within the right maxillary sinus. Scattered mucosal thickening also noted within the ethmoidal air cells on the right. Paranasal sinuses are otherwise clear. No mastoid effusion.  MRA HEAD FINDINGS  The visualized portions of the distal cervical internal carotid arteries are widely patent with antegrade flow. The petrous, cavernous, and supra clinoid segments are symmetric in caliber bilaterally. A1 segments, anterior communicating artery, and anterior cerebral arteries are widely patent. Middle cerebral arteries are widely patent with antegrade flow without high-grade flow-limiting stenosis or proximal branch occlusion. Distal MCA branches are symmetric bilaterally. No intracranial aneurysm within the anterior circulation.  The vertebral arteries are widely patent with antegrade flow. The posterior inferior cerebral arteries are normal. Vertebrobasilar junction and  basilar artery are widely patent with antegrade flow without evidence of basilar tip stenosis or aneurysm. Posterior cerebral arteries are normal bilaterally. The superior cerebellar arteries and anterior inferior cerebellar arteries are widely patent bilaterally. No intracranial aneurysm within the posterior circulation.  IMPRESSION: MRI HEAD IMPRESSION:  1. No acute intracranial infarct or other abnormality identified. 2. Generalized cerebral atrophy with moderate chronic microvascular ischemic disease. 3. Moderate right maxillary sinus disease.  MRA HEAD IMPRESSION:  Unremarkable MRA of the brain with no evidence of proximal branch occlusion or high-grade flow-limiting stenosis.   Electronically Signed   By: Jeannine Boga M.D.   On: 02/18/2014 23:28   Mr Brain Wo Contrast  02/18/2014   CLINICAL DATA:  Stroke  EXAM: MRI HEAD WITHOUT CONTRAST  MRA HEAD WITHOUT CONTRAST  TECHNIQUE: Multiplanar, multiecho pulse sequences of the brain and surrounding structures were obtained without intravenous contrast. Angiographic images of the head were obtained using MRA technique without contrast.  COMPARISON:  Prior CT from 02/17/2014.  FINDINGS: MRI HEAD FINDINGS  Study is degraded by motion artifact.  Generalized cerebral atrophy with moderate chronic microvascular ischemic disease involving the periventricular and deep white matter both  cerebral hemispheres is present. White matter changes also noted within the pons.  No focal parenchymal signal abnormality is identified. No mass lesion, midline shift, or extra-axial fluid collection. Ventricles are normal in size without evidence of hydrocephalus.  No diffusion-weighted signal abnormality is identified to suggest acute intracranial infarct. Gray-white matter differentiation is maintained. Normal flow voids are seen within the intracranial vasculature. No intracranial hemorrhage identified.  The cervicomedullary junction is normal. Pituitary gland is within normal  limits. Pituitary stalk is midline. The globes and optic nerves demonstrate a normal appearance with normal signal intensity. The  The bone marrow signal intensity is normal. Calvarium is intact. Visualized upper cervical spine is within normal limits.  Scalp soft tissues are unremarkable.  Moderate mucosal thickening present within the right maxillary sinus. Scattered mucosal thickening also noted within the ethmoidal air cells on the right. Paranasal sinuses are otherwise clear. No mastoid effusion.  MRA HEAD FINDINGS  The visualized portions of the distal cervical internal carotid arteries are widely patent with antegrade flow. The petrous, cavernous, and supra clinoid segments are symmetric in caliber bilaterally. A1 segments, anterior communicating artery, and anterior cerebral arteries are widely patent. Middle cerebral arteries are widely patent with antegrade flow without high-grade flow-limiting stenosis or proximal branch occlusion. Distal MCA branches are symmetric bilaterally. No intracranial aneurysm within the anterior circulation.  The vertebral arteries are widely patent with antegrade flow. The posterior inferior cerebral arteries are normal. Vertebrobasilar junction and basilar artery are widely patent with antegrade flow without evidence of basilar tip stenosis or aneurysm. Posterior cerebral arteries are normal bilaterally. The superior cerebellar arteries and anterior inferior cerebellar arteries are widely patent bilaterally. No intracranial aneurysm within the posterior circulation.  IMPRESSION: MRI HEAD IMPRESSION:  1. No acute intracranial infarct or other abnormality identified. 2. Generalized cerebral atrophy with moderate chronic microvascular ischemic disease. 3. Moderate right maxillary sinus disease.  MRA HEAD IMPRESSION:  Unremarkable MRA of the brain with no evidence of proximal branch occlusion or high-grade flow-limiting stenosis.   Electronically Signed   By: Jeannine Boga  M.D.   On: 02/18/2014 23:28    Scheduled Meds: . aspirin  300 mg Rectal Daily  . carvedilol  3.125 mg Oral BID WC  . Difluprednate  1 drop Right Eye BID  . doxycycline  100 mg Oral BID  . gabapentin  100-300 mg Oral QHS  . gatifloxacin  1 drop Right Eye QID  . ketorolac  1 drop Right Eye QID  . levothyroxine  88 mcg Oral QAC breakfast  . warfarin  2.5 mg Oral q1800  . Warfarin - Physician Dosing Inpatient   Does not apply q1800   Continuous Infusions: . sodium chloride 1,000 mL (02/18/14 1419)    Principal Problem:   Acute CVA (cerebrovascular accident) Active Problems:   AF (atrial fibrillation)   Weakness of left leg   Warfarin anticoagulation   Chronic kidney disease, stage III (moderate)   PAD (peripheral artery disease)   Systolic CHF  Time spent: 22min  Mitchell Farrell  [facility] Pager [phone]. If 7PM-7AM, please contact night-coverage at [email or web address], password [facility] 02/19/2014, 8:06 AM  LOS: 2 days

## 2014-02-19 NOTE — Progress Notes (Signed)
Stroke Team Progress Note  HISTORY Izear Pine is an 80 y.o. male with a past medical history significant for atrial fibrillation on coumadin, chronic kidney disease, s/p left cataract surgery 2 days ago, arthritis, hypothyroidism, brought in by ambulance as a code stroke due to the above mentioned symptoms.  He has chronic left LE weakness but today got suddenly worse while in the bathroom and was not able to use the left leg at all. Denies pain, numbness of the left leg and also denies similar symptom in the left UE.  No HA, vertgo, double vision, difficulty swallowing, slurred speech, confusion, or language impairment.  Initial NIHSS 2.  CT brain showed no acute abnormality.  INR 2.71  Date last known well: 02/18/14  Time last known well: 915 pm  tPA Given: [id number]  NIHSS: 2 (left leg weakness)   Patient was not administered TPA secondary to delay in arrival. He was admitted to the neuro floor bed for further evaluation and treatment.  SUBJECTIVE His  Family is not at the bedside.  Overall he feels his condition is gradually improving.  Patient had no apparent fall, back injury, severe back pain or radicular pain per family  OBJECTIVE Most recent Vital Signs: Filed Vitals:   02/18/14 1415 02/18/14 1814 02/18/14 2143 02/19/14 0634  BP: 108/63  120/65 122/53  Pulse: 62 70 59 74  Temp: 99.4 F (37.4 C)  97.7 F (36.5 C) 98.7 F (37.1 C)  TempSrc: Oral  Oral Oral  Resp: 18  18 20   Height:      Weight:      SpO2: 98%  98% 98%   CBG (last 3)   Recent Labs  02/17/14 2228  GLUCAP 124*    IV Fluid Intake:   . sodium chloride 1,000 mL (02/18/14 1419)    MEDICATIONS  . aspirin  300 mg Rectal Daily  . carvedilol  3.125 mg Oral BID WC  . Difluprednate  1 drop Right Eye BID  . doxycycline  100 mg Oral BID  . gabapentin  100-300 mg Oral QHS  . gatifloxacin  1 drop Right Eye QID  . ketorolac  1 drop Right Eye QID  . levothyroxine  88 mcg Oral QAC breakfast  . warfarin   2.5 mg Oral q1800  . Warfarin - Physician Dosing Inpatient   Does not apply q1800   PRN:    Diet:  NPO  s Activity:  Bedrest  DVT Prophylaxis:  warfarin  CLINICALLY SIGNIFICANT STUDIES Basic Metabolic Panel:   Recent Labs Lab 02/17/14 2344 02/17/14 2355  NA 141 142  K 3.9 3.7  CL 105 105  CO2 24  --   GLUCOSE 97 100*  BUN 12 11  CREATININE 1.30 1.50*  CALCIUM 9.0  --    Liver Function Tests:   Recent Labs Lab 02/17/14 2344  AST 23  ALT 10  ALKPHOS 81  BILITOT 0.4  PROT 7.6  ALBUMIN 3.2*   CBC:   Recent Labs Lab 02/17/14 2344 02/17/14 2355  WBC 5.9  --   NEUTROABS 4.8  --   HGB 13.2 15.0  HCT 42.0 44.0  MCV 86.6  --   PLT 158  --    Coagulation:   Recent Labs Lab 02/17/14 2344 02/18/14 0305 02/19/14 0320  LABPROT 27.8* 26.9* 26.6*  INR 2.71* 2.59* 2.55*   Cardiac Enzymes: No results found for this basename: CKTOTAL, CKMB, CKMBINDEX, TROPONINI,  in the last 168 hours Urinalysis:   Recent Labs Lab 02/18/14  0040  COLORURINE YELLOW  LABSPEC 1.021  PHURINE 6.0  GLUCOSEU NEGATIVE  HGBUR NEGATIVE  BILIRUBINUR NEGATIVE  KETONESUR NEGATIVE  PROTEINUR NEGATIVE  UROBILINOGEN 1.0  NITRITE NEGATIVE  LEUKOCYTESUR NEGATIVE   Lipid Panel    Component Value Date/Time   CHOL 96 02/19/2014 0030   TRIG 57 02/19/2014 0030   HDL 56 02/19/2014 0030   CHOLHDL 1.7 02/19/2014 0030   VLDL 11 02/19/2014 0030   LDLCALC 29 02/19/2014 0030   HgbA1C  Lab Results  Component Value Date   HGBA1C 5.4 02/17/2014    Urine Drug Screen:     Component Value Date/Time   LABOPIA NONE DETECTED 02/18/2014 0040   COCAINSCRNUR NONE DETECTED 02/18/2014 0040   LABBENZ NONE DETECTED 02/18/2014 0040   AMPHETMU NONE DETECTED 02/18/2014 0040   THCU NONE DETECTED 02/18/2014 0040   LABBARB NONE DETECTED 02/18/2014 0040    Alcohol Level:   Recent Labs Lab 02/17/14 2344  ETH <11    Ct Head Wo Contrast  02/17/2014   CLINICAL DATA:  Left leg weakness  EXAM: CT HEAD WITHOUT  CONTRAST  TECHNIQUE: Contiguous axial images were obtained from the base of the skull through the vertex without intravenous contrast.  COMPARISON:  None.  FINDINGS: The bony calvarium is intact. Chronic postsurgical changes are noted in the maxillary antra. Diffuse mucosal thickening is noted within the right maxillary antrum.  Mild atrophic changes are noted. Chronic white matter ischemic change is seen. No findings to suggest acute hemorrhage, acute infarction or space-occupying mass lesion is noted. A calcification is noted in the right half of the pons of uncertain chronicity.  IMPRESSION: Chronic changes without acute abnormality.  These results were called by telephone at the time of interpretation on 02/17/2014 at 11:59 PM to Dr. Armida Sans, who verbally acknowledged these results.   Electronically Signed   By: Inez Catalina M.D.   On: 02/17/2014 23:59   Mr Jodene Nam Head Wo Contrast  02/18/2014   CLINICAL DATA:  Stroke  EXAM: MRI HEAD WITHOUT CONTRAST  MRA HEAD WITHOUT CONTRAST  TECHNIQUE: Multiplanar, multiecho pulse sequences of the brain and surrounding structures were obtained without intravenous contrast. Angiographic images of the head were obtained using MRA technique without contrast.  COMPARISON:  Prior CT from 02/17/2014.  FINDINGS: MRI HEAD FINDINGS  Study is degraded by motion artifact.  Generalized cerebral atrophy with moderate chronic microvascular ischemic disease involving the periventricular and deep white matter both cerebral hemispheres is present. White matter changes also noted within the pons.  No focal parenchymal signal abnormality is identified. No mass lesion, midline shift, or extra-axial fluid collection. Ventricles are normal in size without evidence of hydrocephalus.  No diffusion-weighted signal abnormality is identified to suggest acute intracranial infarct. Gray-white matter differentiation is maintained. Normal flow voids are seen within the intracranial vasculature. No  intracranial hemorrhage identified.  The cervicomedullary junction is normal. Pituitary gland is within normal limits. Pituitary stalk is midline. The globes and optic nerves demonstrate a normal appearance with normal signal intensity. The  The bone marrow signal intensity is normal. Calvarium is intact. Visualized upper cervical spine is within normal limits.  Scalp soft tissues are unremarkable.  Moderate mucosal thickening present within the right maxillary sinus. Scattered mucosal thickening also noted within the ethmoidal air cells on the right. Paranasal sinuses are otherwise clear. No mastoid effusion.  MRA HEAD FINDINGS  The visualized portions of the distal cervical internal carotid arteries are widely patent with antegrade flow. The petrous, cavernous, and supra  clinoid segments are symmetric in caliber bilaterally. A1 segments, anterior communicating artery, and anterior cerebral arteries are widely patent. Middle cerebral arteries are widely patent with antegrade flow without high-grade flow-limiting stenosis or proximal branch occlusion. Distal MCA branches are symmetric bilaterally. No intracranial aneurysm within the anterior circulation.  The vertebral arteries are widely patent with antegrade flow. The posterior inferior cerebral arteries are normal. Vertebrobasilar junction and basilar artery are widely patent with antegrade flow without evidence of basilar tip stenosis or aneurysm. Posterior cerebral arteries are normal bilaterally. The superior cerebellar arteries and anterior inferior cerebellar arteries are widely patent bilaterally. No intracranial aneurysm within the posterior circulation.  IMPRESSION: MRI HEAD IMPRESSION:  1. No acute intracranial infarct or other abnormality identified. 2. Generalized cerebral atrophy with moderate chronic microvascular ischemic disease. 3. Moderate right maxillary sinus disease.  MRA HEAD IMPRESSION:  Unremarkable MRA of the brain with no evidence of  proximal branch occlusion or high-grade flow-limiting stenosis.   Electronically Signed   By: Jeannine Boga M.D.   On: 02/18/2014 23:28   Mr Brain Wo Contrast  02/18/2014   CLINICAL DATA:  Stroke  EXAM: MRI HEAD WITHOUT CONTRAST  MRA HEAD WITHOUT CONTRAST  TECHNIQUE: Multiplanar, multiecho pulse sequences of the brain and surrounding structures were obtained without intravenous contrast. Angiographic images of the head were obtained using MRA technique without contrast.  COMPARISON:  Prior CT from 02/17/2014.  FINDINGS: MRI HEAD FINDINGS  Study is degraded by motion artifact.  Generalized cerebral atrophy with moderate chronic microvascular ischemic disease involving the periventricular and deep white matter both cerebral hemispheres is present. White matter changes also noted within the pons.  No focal parenchymal signal abnormality is identified. No mass lesion, midline shift, or extra-axial fluid collection. Ventricles are normal in size without evidence of hydrocephalus.  No diffusion-weighted signal abnormality is identified to suggest acute intracranial infarct. Gray-white matter differentiation is maintained. Normal flow voids are seen within the intracranial vasculature. No intracranial hemorrhage identified.  The cervicomedullary junction is normal. Pituitary gland is within normal limits. Pituitary stalk is midline. The globes and optic nerves demonstrate a normal appearance with normal signal intensity. The  The bone marrow signal intensity is normal. Calvarium is intact. Visualized upper cervical spine is within normal limits.  Scalp soft tissues are unremarkable.  Moderate mucosal thickening present within the right maxillary sinus. Scattered mucosal thickening also noted within the ethmoidal air cells on the right. Paranasal sinuses are otherwise clear. No mastoid effusion.  MRA HEAD FINDINGS  The visualized portions of the distal cervical internal carotid arteries are widely patent with  antegrade flow. The petrous, cavernous, and supra clinoid segments are symmetric in caliber bilaterally. A1 segments, anterior communicating artery, and anterior cerebral arteries are widely patent. Middle cerebral arteries are widely patent with antegrade flow without high-grade flow-limiting stenosis or proximal branch occlusion. Distal MCA branches are symmetric bilaterally. No intracranial aneurysm within the anterior circulation.  The vertebral arteries are widely patent with antegrade flow. The posterior inferior cerebral arteries are normal. Vertebrobasilar junction and basilar artery are widely patent with antegrade flow without evidence of basilar tip stenosis or aneurysm. Posterior cerebral arteries are normal bilaterally. The superior cerebellar arteries and anterior inferior cerebellar arteries are widely patent bilaterally. No intracranial aneurysm within the posterior circulation.  IMPRESSION: MRI HEAD IMPRESSION:  1. No acute intracranial infarct or other abnormality identified. 2. Generalized cerebral atrophy with moderate chronic microvascular ischemic disease. 3. Moderate right maxillary sinus disease.  MRA HEAD IMPRESSION:  Unremarkable  MRA of the brain with no evidence of proximal branch occlusion or high-grade flow-limiting stenosis.   Electronically Signed   By: Jeannine Boga M.D.   On: 02/18/2014 23:28        2D Echocardiogram  ordered Carotid Doppler  ordered  CXR  odered  EKG Sinus rhythm with occasional Premature ventricular complexes Therapy Recommendations  pending  Physical Exam   Frail elderly african male not in distress.  Afebrile. Head is nontraumatic. Neck is supple without bruit. Hearing is normal. Cardiac exam soft ejection murmur.no gallop. Lungs are clear to auscultation. Distal pulses are well felt. Neurologic Examination:  Mental Status:  Alert, oriented, thought content appropriate. Speech fluent without evidence of aphasia. Able to follow 3 step  commands without difficulty.  Cranial Nerves:  II: Discs flat bilaterally; Visual fields grossly normal, pupils equal, round, reactive to light and accommodation  III,IV, VI: ptosis not present, extra-ocular motions intact bilaterally  V,VII: smile symmetric, facial light touch sensation normal bilaterally  VIII: hearing normal bilaterally  IX,X: gag reflex present  XI: bilateral shoulder shrug  XII: midline tongue extension without atrophy or fasciculations  Motor:  Right : Upper extremity 5/5 Left: Upper extremity 5/5  Lower extremity 5/5 Lower extremity 2-3/5 proximally and distally Tone and bulk:normal tone throughout; no atrophy noted  Sensory: Pinprick and light touch intact throughout, bilaterally  Deep Tendon Reflexes:  1 all over  Plantars:  Right: downgoing Left: upgoing  Cerebellar:  normal finger-to-nose, normal heel-to-shin test in the right but can not perform in the left LE due to weakness.  Gait:  Not tested  ASSESSMENT Mr. Alisa Appleyard is a 80 y.o. male presenting with acute left leg weakness. Imaging shows no acute infarct  .  On warfarin prior to admission. Now on warfarin for secondary stroke prevention. Patient with resultant left leg weakness etiology unclear.Stroke work up underway.   Atrial Fibrillation on warfarin with therepeautic INR on admission   LDL pending      Hospital day # 2  TREATMENT/PLAN  Continue   warfarin for secondary stroke prevention.  Mobilize out of bed. PT/OT consults  Rehab versus SNF   Echo/dopplers  Check MRI Lumbar spine wo for compressive lesion    Antony Contras, MD 02/19/2014 11:23 AM       To contact Stroke Continuity provider, please refer to [email or web address] After hours, contact General Neurology

## 2014-02-19 NOTE — Evaluation (Signed)
Physical Therapy Evaluation Patient Details Name: Mitchell Farrell MRN: [id number] DOB: 30-Aug-1919 Today's Date: 02/19/2014   History of Present Illness  Pt admit to r/o CVA secondary to increasion left LE weakness.  MRI negative.  w/u for lumbar MRI to r/o compression.    Clinical Impression  Pt admitted with above. Pt currently with functional limitations due to the deficits listed below (see PT Problem List). Pt will benefit from skilled PT to increase their independence and safety with mobility to allow discharge to the venue listed below.   Follow Up Recommendations SNF;Supervision/Assistance - 24 hour    Equipment Recommendations  Other (comment) (TBA)    Recommendations for Other Services       Precautions / Restrictions Precautions Precautions: Fall Restrictions Weight Bearing Restrictions: No      Mobility  Bed Mobility                  Transfers Overall transfer level: Needs assistance Equipment used: Rolling walker (2 wheeled) Transfers: Sit to/from Stand Sit to Stand: Mod assist         General transfer comment: Pt needs assist to lean forward as he has strong posterior bias.  Pt takes incr time to come to standing position to RW.  Needs steadying support with standing.  Unable to stand fully upright keeping hips, knees and ankles flexed.  Marched in place for some time with RW.    Ambulation/Gait                Stairs            Wheelchair Mobility    Modified Rankin (Stroke Patients Only) Modified Rankin (Stroke Patients Only) Pre-Morbid Rankin Score: Moderately severe disability Modified Rankin: Moderately severe disability     Balance Overall balance assessment: Needs assistance;History of Falls       Postural control: Posterior lean Standing balance support: Bilateral upper extremity supported;During functional activity Standing balance-Leahy Scale: Poor Standing balance comment: Needs UE support for balance.  STrong  posterior bias.                             Pertinent Vitals/Pain VSS, no pain    Home Living Family/patient expects to be discharged to:: Private residence Living Arrangements: Other relatives Available Help at Discharge: Family (daughter and cousin) Type of Home: House Home Access: Ramped entrance     Home Layout: Two level;Able to live on main level with bedroom/bathroom Home Equipment: Gilford Rile - 2 wheels;Wheelchair - manual;Bedside commode;Grab bars - tub/shower;Hand held shower head;Tub bench (tub with shower)      Prior Function Level of Independence: Independent with assistive device(s)         Comments: used RW PTA, needed assist withshoes but did rest of bathing and dressing     Hand Dominance   Dominant Hand: Right    Extremity/Trunk Assessment   Upper Extremity Assessment: Defer to OT evaluation           Lower Extremity Assessment: LLE deficits/detail;RLE deficits/detail RLE Deficits / Details: grossly 3/5 hip and knee, ankle 3-/5 LLE Deficits / Details: grossly 3-/5, ankle 2+/5  Cervical / Trunk Assessment: Kyphotic  Communication   Communication: No difficulties  Cognition Arousal/Alertness: Awake/alert Behavior During Therapy: WFL for tasks assessed/performed Overall Cognitive Status: Within Functional Limits for tasks assessed                      General Comments  Exercises General Exercises - Lower Extremity Ankle Circles/Pumps: AROM;Both;10 reps;Seated Long Arc Quad: AROM;Both;10 reps;Seated Hip Flexion/Marching: AROM;Both;10 reps;Seated      Assessment/Plan    PT Assessment Patient needs continued PT services  PT Diagnosis Generalized weakness   PT Problem List Decreased balance;Decreased activity tolerance;Decreased mobility;Decreased strength;Decreased safety awareness;Decreased knowledge of use of DME;Decreased knowledge of precautions  PT Treatment Interventions DME instruction;Gait training;Functional  mobility training;Therapeutic activities;Therapeutic exercise;Balance training;Patient/family education   PT Goals (Current goals can be found in the Care Plan section) Acute Rehab PT Goals Patient Stated Goal: to go home after therapy PT Goal Formulation: With patient Time For Goal Achievement: 02/26/14 Potential to Achieve Goals: Good    Frequency Min 3X/week   Barriers to discharge Decreased caregiver support      Co-evaluation               End of Session Equipment Utilized During Treatment: Gait belt Activity Tolerance: Patient limited by fatigue Patient left: in chair;with call bell/phone within reach Nurse Communication: Mobility status         Time: 1100-1120 PT Time Calculation (min): 20 min   Charges:   PT Evaluation $Initial PT Evaluation Tier I: 1 Procedure PT Treatments $Therapeutic Activity: 8-22 mins   PT G Codes:          Christianne Dolin 2014-03-06, 11:55 AM  [facility] [phone] (pager)

## 2014-02-20 DIAGNOSIS — I739 Peripheral vascular disease, unspecified: Secondary | ICD-10-CM

## 2014-02-20 LAB — PROTIME-INR
INR: 2.52 — ABNORMAL HIGH (ref 0.00–1.49)
Prothrombin Time: 26.3 seconds — ABNORMAL HIGH (ref 11.6–15.2)

## 2014-02-20 NOTE — Progress Notes (Signed)
Stroke Team Progress Note  HISTORY Mitchell Farrell is a 80 y.o. male with a past medical history significant for atrial fibrillation on coumadin, chronic kidney disease, s/p left cataract surgery 2 days ago, arthritis, and hypothyroidism, brought in by ambulance as a code stroke due to left lower extremity weakness.  He has chronic left LE weakness but today got suddenly worse while in the bathroom and was not able to use the left leg at all. Denies pain, numbness of the left leg and also denies similar symptom in the left UE.  No HA, vertgo, double vision, difficulty swallowing, slurred speech, confusion, or language impairment.  Initial NIHSS 2.  CT brain showed no acute abnormality.  INR 2.71   Date last known well: 02/18/14  Time last known well: 915 pm  tPA Given: [id number]  NIHSS: 2 (left leg weakness)   Patient was not administered TPA secondary to delay in arrival. He was admitted to the neuro floor bed for further evaluation and treatment.  SUBJECTIVE There are no family members at bedside. The patient reports continued left lower extremity weakness. Dr. Leonie Man discussed the results of the lumbar spine MRI which showed moderate to moderately severe central canal stenosis at L4-5 which probably accounts for the patient's symptoms.  OBJECTIVE Most recent Vital Signs: Filed Vitals:   02/18/14 2143 02/19/14 0634 02/19/14 1955 02/20/14 0605  BP: 120/65 122/53 110/68 114/66  Pulse: 59 74 76 83  Temp: 97.7 F (36.5 C) 98.7 F (37.1 C) 97.7 F (36.5 C) 97.5 F (36.4 C)  TempSrc: Oral Oral Oral Oral  Resp: 18 20 18 16   Height:      Weight:      SpO2: 98% 98% 99% 100%   CBG (last 3)   Recent Labs  02/17/14 2228  GLUCAP 124*    IV Fluid Intake:   . sodium chloride 75 mL/hr at 02/19/14 2014    MEDICATIONS  . aspirin  300 mg Rectal Daily  . carvedilol  3.125 mg Oral BID WC  . Difluprednate  1 drop Right Eye BID  . doxycycline  100 mg Oral BID  . gabapentin  100-300 mg  Oral QHS  . gatifloxacin  1 drop Right Eye QID  . ketorolac  1 drop Right Eye QID  . levothyroxine  88 mcg Oral QAC breakfast  . warfarin  2.5 mg Oral q1800  . Warfarin - Physician Dosing Inpatient   Does not apply q1800   PRN:    Diet:  Cardiac  diet with thin liquids Activity:  Activity as tolerated DVT Prophylaxis:  warfarin  CLINICALLY SIGNIFICANT STUDIES Basic Metabolic Panel:   Recent Labs Lab 02/17/14 2344 02/17/14 2355  NA 141 142  K 3.9 3.7  CL 105 105  CO2 24  --   GLUCOSE 97 100*  BUN 12 11  CREATININE 1.30 1.50*  CALCIUM 9.0  --    Liver Function Tests:   Recent Labs Lab 02/17/14 2344  AST 23  ALT 10  ALKPHOS 81  BILITOT 0.4  PROT 7.6  ALBUMIN 3.2*   CBC:   Recent Labs Lab 02/17/14 2344 02/17/14 2355  WBC 5.9  --   NEUTROABS 4.8  --   HGB 13.2 15.0  HCT 42.0 44.0  MCV 86.6  --   PLT 158  --    Coagulation:   Recent Labs Lab 02/17/14 2344 02/18/14 0305 02/19/14 0320 02/20/14 0341  LABPROT 27.8* 26.9* 26.6* 26.3*  INR 2.71* 2.59* 2.55* 2.52*   Cardiac  Enzymes: No results found for this basename: CKTOTAL, CKMB, CKMBINDEX, TROPONINI,  in the last 168 hours Urinalysis:   Recent Labs Lab 02/18/14 0040  COLORURINE YELLOW  LABSPEC 1.021  PHURINE 6.0  GLUCOSEU NEGATIVE  HGBUR NEGATIVE  BILIRUBINUR NEGATIVE  KETONESUR NEGATIVE  PROTEINUR NEGATIVE  UROBILINOGEN 1.0  NITRITE NEGATIVE  LEUKOCYTESUR NEGATIVE   Lipid Panel    Component Value Date/Time   CHOL 96 02/19/2014 0030   TRIG 57 02/19/2014 0030   HDL 56 02/19/2014 0030   CHOLHDL 1.7 02/19/2014 0030   VLDL 11 02/19/2014 0030   LDLCALC 29 02/19/2014 0030   HgbA1C  Lab Results  Component Value Date   HGBA1C 5.4 02/17/2014    Urine Drug Screen:     Component Value Date/Time   LABOPIA NONE DETECTED 02/18/2014 0040   COCAINSCRNUR NONE DETECTED 02/18/2014 0040   LABBENZ NONE DETECTED 02/18/2014 0040   AMPHETMU NONE DETECTED 02/18/2014 0040   THCU NONE DETECTED 02/18/2014 0040    LABBARB NONE DETECTED 02/18/2014 0040    Alcohol Level:   Recent Labs Lab 02/17/14 2344  ETH <11    Dg Chest 2 View 02/19/2014    New bibasilar patchy atelectasis or infiltrates, right greater than left.   Mitchell Farrell Head Wo Contrast 02/18/2014    MRI HEAD IMPRESSION:  1. No acute intracranial infarct or other abnormality identified. 2. Generalized cerebral atrophy with moderate chronic microvascular ischemic disease. 3. Moderate right maxillary sinus disease.    MRA HEAD IMPRESSION:  Unremarkable MRA of the brain with no evidence of proximal branch occlusion or high-grade flow-limiting stenosis.     Mitchell Lumbar Spine Wo Contrast 02/19/2014    No acute finding.  Remote L1 fracture without associated central canal compromise.   Moderate to moderately severe central canal stenosis and right worse than left lateral recess narrowing L4-5.  There is also right foraminal narrowing at this level.  Mild central canal and lateral recess narrowing L5-S1 where there is also bilateral foraminal narrowing, worse on the right.      2D Echocardiogram -  Ejection fraction 55-60%. No cardiac source of emboli identified.  Carotid Doppler - Carotid Duplex (Doppler) has been completed. Preliminary findings: Bilateral: 1-39% ICA stenosis. Vertebral artery flow is antegrade.    CXR - New bibasilar patchy atelectasis or infiltrates, right greater than left.  EKG Sinus rhythm with occasional Premature ventricular complexes   Therapy Recommendations - skilled nursing facility placement recommended.  Physical Exam     Frail elderly african male not in distress.  Afebrile. Head is nontraumatic. Neck is supple without bruit. Hearing is normal. Cardiac exam soft ejection murmur.no gallop. Lungs are clear to auscultation. Distal pulses are well felt.  Neurologic Examination:  Mental Status:  Alert, oriented, thought content appropriate. Speech fluent without evidence of aphasia. Able to follow 3 step  commands without difficulty.  Cranial Nerves:  II: Discs flat bilaterally; Visual fields grossly normal, pupils equal, round, reactive to light and accommodation  III,IV, VI: ptosis not present, extra-ocular motions intact bilaterally  V,VII: smile symmetric, facial light touch sensation normal bilaterally  VIII: hearing normal bilaterally  IX,X: gag reflex present  XI: bilateral shoulder shrug  XII: midline tongue extension without atrophy or fasciculations  Motor:  Right : Upper extremity 5/5 Left: Upper extremity 5/5  Lower extremity 5/5 Lower extremity 2-3/5 proximally and distally Tone and bulk:normal tone throughout; no atrophy noted  Sensory: Pinprick and light touch intact throughout, bilaterally  Deep Tendon Reflexes:  1 all  over  Plantars:  Right: downgoing Left: upgoing  Cerebellar:  normal finger-to-nose, normal heel-to-shin test in the right but can not perform in the left LE due to weakness.  Gait:  Not tested  ASSESSMENT Mitchell. Navid Farrell is a 80 y.o. male presenting with acute left leg weakness. Imaging shows no acute infarct. On warfarin prior to admission. Now on warfarin for secondary stroke prevention. Patient with resultant left leg weakness etiology most likely due to spinal stenosis..Stroke work up completed.   Atrial Fibrillation on warfarin with therepeautic INR on admission - INR 2.52 today  Cholesterol 96 LDL 29  Recent cataract surgery right eye  MRI lumbar spine - Moderate to moderately severe central canal stenosis and right worse than left lateral recess narrowing L4-5.    Hospital day # 3  TREATMENT/PLAN  Continue   warfarin for secondary stroke prevention.  Mobilize out of bed. PT/OT consults  Therapist recommends skilled nursing facility placement   Stroke team will sign off. Please call with him be of further assistance.    Mikey Bussing PA-C [facility] Pager [phone] 02/20/2014, 10:13 AM I have personally  examined this patient, reviewed data, and about plan of care and he agree with the above  Antony Contras, MD    To contact Stroke Continuity provider, please refer to [email or web address] After hours, contact General Neurology

## 2014-02-20 NOTE — Progress Notes (Signed)
Physical Therapy Treatment Patient Details Name: Mitchell Farrell MRN: [id number] DOB: [birth year] Today's Date: 02/20/2014    History of Present Illness Pt admit to r/o CVA secondary to increasion left LE weakness.  MRI negative.  w/u for lumbar MRI to r/o compression.  MRI showed L4/5 narrowing and old L1 fracture.    PT Comments    Pt admitted with above. Pt currently with functional limitations due to balance and endurance deficits. Pt will benefit from skilled PT to increase their independence and safety with mobility to allow discharge to the venue listed below.    Follow Up Recommendations  SNF;Supervision/Assistance - 24 hour     Equipment Recommendations  Other (comment) (TBA)    Recommendations for Other Services       Precautions / Restrictions Precautions Precautions: Fall Restrictions Weight Bearing Restrictions: No    Mobility  Bed Mobility Overal bed mobility: Needs Assistance Bed Mobility: Supine to Sit     Supine to sit: Mod assist     General bed mobility comments: requires use of bed rails   Transfers Overall transfer level: Needs assistance Equipment used: Rolling walker (2 wheeled) Transfers: Sit to/from Stand Sit to Stand: Mod assist;+2 physical assistance         General transfer comment: needs assist to shift weight anterior to prevent posterior LOB. Needed to use momentum and needed assist to power up.  pt upon static standing has kyphotic posture. Pt with knees slightly flexed.   Ambulation/Gait Ambulation/Gait assistance: Mod assist;+2 safety/equipment Ambulation Distance (Feet): 35 Feet Assistive device: Rolling walker (2 wheeled) Gait Pattern/deviations: Step-to pattern;Decreased stride length;Decreased step length - left;Antalgic;Leaning posteriorly;Trunk flexed   Gait velocity interpretation: Below normal speed for age/gender General Gait Details: Pt needed cues for steps technique as well as assist to maneuver RW.  Pt fatigued  quickly and limited distance with LLE buckling during walk x 1.     Stairs            Wheelchair Mobility    Modified Rankin (Stroke Patients Only)       Balance Overall balance assessment: Needs assistance;History of Falls Sitting-balance support: No upper extremity supported;Feet supported Sitting balance-Leahy Scale: Fair   Postural control: Posterior lean Standing balance support: Bilateral upper extremity supported;During functional activity Standing balance-Leahy Scale: Poor Standing balance comment: Needs UE support for balance.  Strong posterior bias.                    Cognition Arousal/Alertness: Awake/alert Behavior During Therapy: WFL for tasks assessed/performed Overall Cognitive Status: Within Functional Limits for tasks assessed                      Exercises General Exercises - Lower Extremity Ankle Circles/Pumps: AROM;Both;10 reps;Seated Long Arc Quad: AROM;Both;10 reps;Seated Hip Flexion/Marching: AROM;Both;10 reps;Seated    General Comments        Pertinent Vitals/Pain VSS, LLE pain but not rated.    Home Living                      Prior Function            PT Goals (current goals can now be found in the care plan section) Progress towards PT goals: Progressing toward goals    Frequency  Min 3X/week    PT Plan Current plan remains appropriate    Co-evaluation             End of Session Equipment Utilized During  Treatment: Gait belt Activity Tolerance: Patient limited by fatigue Patient left: in chair;with call bell/phone within reach     Time: 1136-1200 PT Time Calculation (min): 24 min  Charges:  $Gait Training: 8-22 mins $Therapeutic Exercise: 8-22 mins                    G Codes:      Elijiah Mickley Dorene Ar 2014-02-22, 1:04 PM [facility] [phone] (pager)

## 2014-02-20 NOTE — Progress Notes (Signed)
SLP Cancellation Note  Patient Details Name: Mitchell Farrell MRN: [id number] DOB: 11-24-1918   Cancelled treatment:       Reason Eval/Treat Not Completed: SLP screened, no needs identified, will sign off.    Katherene Ponto Juquan Reznick 02/20/2014, 10:43 AM

## 2014-02-20 NOTE — Progress Notes (Signed)
[facility] PROGRESS NOTE  Mitchell Farrell [id number] DOB: 1918-12-29 DOA: 02/17/2014 PCP: Elsie Stain, MD  Assessment/Plan: 1. L sided weakness 1. Neurology following 2. Head CT unremarkable 3. On therapeutic coumadin 4. MRI brain neg for CVA 5. Carotid Dopplers without stenosis 6. 2D echo with EF 55-60% with mod LVH 7. Therapy recs for SNF. Will consult SW 8. Lumbar MRI revealed mod-mod severe disease primarily at L4-5 with mod-mod severe foraminal narrowing. Remote L1 fracture noted 2. Afib 1. Remains rate controlled 2. For now, monitor closely 3. On coreg per home regimen, will cont 3. Stage 3CKD 1. Appears stable 4. Chronic systolic chf 1. Euvolemic, no signs of vol overload 5. DVT prophylaxis 1. Therapeutic coumadin  Code Status: DNR Family Communication: Pt in room Disposition Plan: Pending   Consultants:  Neurology  Procedures:    Antibiotics:     HPI/Subjective: No complaints. Reports feeling slightly better today  Objective: Filed Vitals:   02/18/14 2143 02/19/14 0634 02/19/14 1955 02/20/14 0605  BP: 120/65 122/53 110/68 114/66  Pulse: 59 74 76 83  Temp: 97.7 F (36.5 C) 98.7 F (37.1 C) 97.7 F (36.5 C) 97.5 F (36.4 C)  TempSrc: Oral Oral Oral Oral  Resp: 18 20 18 16   Height:      Weight:      SpO2: 98% 98% 99% 100%    Intake/Output Summary (Last 24 hours) at 02/20/14 0803 Last data filed at 02/20/14 [id number]  Gross per 24 hour  Intake      0 ml  Output   1151 ml  Net  -1151 ml   Filed Weights   02/17/14 2231  Weight: 72.576 kg (160 lb)    Exam:   General:  Awake, in nad  Cardiovascular: bradycardic, s1, s2  Respiratory: normal resp effort, no wheezing or crackles  Abdomen: soft, nondistended  Musculoskeletal: perfused, no clubbing, no edema  Data Reviewed: Basic Metabolic Panel:  Recent Labs Lab 02/17/14 2344 02/17/14 2355  NA 141 142  K 3.9 3.7  CL 105 105  CO2 24  --   GLUCOSE 97 100*  BUN 12 11   CREATININE 1.30 1.50*  CALCIUM 9.0  --    Liver Function Tests:  Recent Labs Lab 02/17/14 2344  AST 23  ALT 10  ALKPHOS 81  BILITOT 0.4  PROT 7.6  ALBUMIN 3.2*   No results found for this basename: LIPASE, AMYLASE,  in the last 168 hours No results found for this basename: AMMONIA,  in the last 168 hours CBC:  Recent Labs Lab 02/17/14 2344 02/17/14 2355  WBC 5.9  --   NEUTROABS 4.8  --   HGB 13.2 15.0  HCT 42.0 44.0  MCV 86.6  --   PLT 158  --    Cardiac Enzymes: No results found for this basename: CKTOTAL, CKMB, CKMBINDEX, TROPONINI,  in the last 168 hours BNP (last 3 results) No results found for this basename: PROBNP,  in the last 8760 hours CBG:  Recent Labs Lab 02/17/14 2228  GLUCAP 124*    No results found for this or any previous visit (from the past 240 hour(s)).   Studies: Dg Chest 2 View  02/19/2014   CLINICAL DATA:  Stroke  EXAM: CHEST - 2 VIEW  COMPARISON:  08/10/2010  FINDINGS: Low lung volumes. Patchy airspace opacities in the lung bases right greater than left, new since previous exam. No definite effusion. Heart size upper limits normal. .  Spondylitic changes in the lower lumbar spine P  IMPRESSION: New bibasilar patchy atelectasis or infiltrates, right greater than left.   Electronically Signed   By: Arne Cleveland M.D.   On: 02/19/2014 15:55   Mr Jodene Nam Head Wo Contrast  02/18/2014   CLINICAL DATA:  Stroke  EXAM: MRI HEAD WITHOUT CONTRAST  MRA HEAD WITHOUT CONTRAST  TECHNIQUE: Multiplanar, multiecho pulse sequences of the brain and surrounding structures were obtained without intravenous contrast. Angiographic images of the head were obtained using MRA technique without contrast.  COMPARISON:  Prior CT from 02/17/2014.  FINDINGS: MRI HEAD FINDINGS  Study is degraded by motion artifact.  Generalized cerebral atrophy with moderate chronic microvascular ischemic disease involving the periventricular and deep white matter both cerebral hemispheres is  present. White matter changes also noted within the pons.  No focal parenchymal signal abnormality is identified. No mass lesion, midline shift, or extra-axial fluid collection. Ventricles are normal in size without evidence of hydrocephalus.  No diffusion-weighted signal abnormality is identified to suggest acute intracranial infarct. Gray-white matter differentiation is maintained. Normal flow voids are seen within the intracranial vasculature. No intracranial hemorrhage identified.  The cervicomedullary junction is normal. Pituitary gland is within normal limits. Pituitary stalk is midline. The globes and optic nerves demonstrate a normal appearance with normal signal intensity. The  The bone marrow signal intensity is normal. Calvarium is intact. Visualized upper cervical spine is within normal limits.  Scalp soft tissues are unremarkable.  Moderate mucosal thickening present within the right maxillary sinus. Scattered mucosal thickening also noted within the ethmoidal air cells on the right. Paranasal sinuses are otherwise clear. No mastoid effusion.  MRA HEAD FINDINGS  The visualized portions of the distal cervical internal carotid arteries are widely patent with antegrade flow. The petrous, cavernous, and supra clinoid segments are symmetric in caliber bilaterally. A1 segments, anterior communicating artery, and anterior cerebral arteries are widely patent. Middle cerebral arteries are widely patent with antegrade flow without high-grade flow-limiting stenosis or proximal branch occlusion. Distal MCA branches are symmetric bilaterally. No intracranial aneurysm within the anterior circulation.  The vertebral arteries are widely patent with antegrade flow. The posterior inferior cerebral arteries are normal. Vertebrobasilar junction and basilar artery are widely patent with antegrade flow without evidence of basilar tip stenosis or aneurysm. Posterior cerebral arteries are normal bilaterally. The superior  cerebellar arteries and anterior inferior cerebellar arteries are widely patent bilaterally. No intracranial aneurysm within the posterior circulation.  IMPRESSION: MRI HEAD IMPRESSION:  1. No acute intracranial infarct or other abnormality identified. 2. Generalized cerebral atrophy with moderate chronic microvascular ischemic disease. 3. Moderate right maxillary sinus disease.  MRA HEAD IMPRESSION:  Unremarkable MRA of the brain with no evidence of proximal branch occlusion or high-grade flow-limiting stenosis.   Electronically Signed   By: Jeannine Boga M.D.   On: 02/18/2014 23:28   Mr Brain Wo Contrast  02/18/2014   CLINICAL DATA:  Stroke  EXAM: MRI HEAD WITHOUT CONTRAST  MRA HEAD WITHOUT CONTRAST  TECHNIQUE: Multiplanar, multiecho pulse sequences of the brain and surrounding structures were obtained without intravenous contrast. Angiographic images of the head were obtained using MRA technique without contrast.  COMPARISON:  Prior CT from 02/17/2014.  FINDINGS: MRI HEAD FINDINGS  Study is degraded by motion artifact.  Generalized cerebral atrophy with moderate chronic microvascular ischemic disease involving the periventricular and deep white matter both cerebral hemispheres is present. White matter changes also noted within the pons.  No focal parenchymal signal abnormality is identified. No mass lesion, midline shift, or extra-axial fluid collection.  Ventricles are normal in size without evidence of hydrocephalus.  No diffusion-weighted signal abnormality is identified to suggest acute intracranial infarct. Gray-white matter differentiation is maintained. Normal flow voids are seen within the intracranial vasculature. No intracranial hemorrhage identified.  The cervicomedullary junction is normal. Pituitary gland is within normal limits. Pituitary stalk is midline. The globes and optic nerves demonstrate a normal appearance with normal signal intensity. The  The bone marrow signal intensity is  normal. Calvarium is intact. Visualized upper cervical spine is within normal limits.  Scalp soft tissues are unremarkable.  Moderate mucosal thickening present within the right maxillary sinus. Scattered mucosal thickening also noted within the ethmoidal air cells on the right. Paranasal sinuses are otherwise clear. No mastoid effusion.  MRA HEAD FINDINGS  The visualized portions of the distal cervical internal carotid arteries are widely patent with antegrade flow. The petrous, cavernous, and supra clinoid segments are symmetric in caliber bilaterally. A1 segments, anterior communicating artery, and anterior cerebral arteries are widely patent. Middle cerebral arteries are widely patent with antegrade flow without high-grade flow-limiting stenosis or proximal branch occlusion. Distal MCA branches are symmetric bilaterally. No intracranial aneurysm within the anterior circulation.  The vertebral arteries are widely patent with antegrade flow. The posterior inferior cerebral arteries are normal. Vertebrobasilar junction and basilar artery are widely patent with antegrade flow without evidence of basilar tip stenosis or aneurysm. Posterior cerebral arteries are normal bilaterally. The superior cerebellar arteries and anterior inferior cerebellar arteries are widely patent bilaterally. No intracranial aneurysm within the posterior circulation.  IMPRESSION: MRI HEAD IMPRESSION:  1. No acute intracranial infarct or other abnormality identified. 2. Generalized cerebral atrophy with moderate chronic microvascular ischemic disease. 3. Moderate right maxillary sinus disease.  MRA HEAD IMPRESSION:  Unremarkable MRA of the brain with no evidence of proximal branch occlusion or high-grade flow-limiting stenosis.   Electronically Signed   By: Jeannine Boga M.D.   On: 02/18/2014 23:28   Mr Lumbar Spine Wo Contrast  02/19/2014   CLINICAL DATA:  Chronic left lower extremity weakness with acute worsening today. Left leg  numbness.  EXAM: MRI LUMBAR SPINE WITHOUT CONTRAST  TECHNIQUE: Multiplanar, multisequence MR imaging of the lumbar spine was performed. No intravenous contrast was administered.  COMPARISON:  Plain films lumbar spine 03/18/2012.  FINDINGS: Remote L1 fracture is seen as on prior plain films. No acute fracture is identified. Multilevel degenerative endplate signal change is seen. No worrisome marrow lesion is present. The conus medullaris is normal in signal and position. Imaged intra-abdominal contents are unremarkable.  The T10-11 and T11-12 levels are imaged in the sagittal plane only and negative.  L1-2: Minimal bony retropulsion without central canal or foraminal narrowing.  L1-2: Facet degenerative disease and a shallow disc bulge without central canal or foraminal narrowing.  L2-3: Annular tear and a tiny central protrusion without central canal or foraminal stenosis.  L3-4: Ligamentum flavum thickening is present. Disc and endplate spur are identified. There is mild to moderate central canal narrowing with narrowing in the right lateral recess. The foramina are open.  L4-5: Ligamentum flavum thickening is present and there is facet degenerative change. A mild disc bulge is seen. There is moderate to moderately severe central canal stenosis and narrowing in the lateral recesses, worse on the right. Right foraminal narrowing is also identified. The left foramen appears open.  L5-S1: There is a shallow disc bulge with ligamentum flavum thickening. The central canal and lateral recesses are mildly narrowed. Moderate to moderately severe foraminal narrowing is  worse on the right.  IMPRESSION: No acute finding.  Remote L1 fracture without associated central canal compromise.  Moderate to moderately severe central canal stenosis and right worse than left lateral recess narrowing L4-5. There is also right foraminal narrowing at this level.  Mild central canal and lateral recess narrowing L5-S1 where there is also  bilateral foraminal narrowing, worse on the right.   Electronically Signed   By: Inge Rise M.D.   On: 02/19/2014 15:36    Scheduled Meds: . aspirin  300 mg Rectal Daily  . carvedilol  3.125 mg Oral BID WC  . Difluprednate  1 drop Right Eye BID  . doxycycline  100 mg Oral BID  . gabapentin  100-300 mg Oral QHS  . gatifloxacin  1 drop Right Eye QID  . ketorolac  1 drop Right Eye QID  . levothyroxine  88 mcg Oral QAC breakfast  . warfarin  2.5 mg Oral q1800  . Warfarin - Physician Dosing Inpatient   Does not apply q1800   Continuous Infusions: . sodium chloride 75 mL/hr at 02/19/14 2014    Principal Problem:   Acute CVA (cerebrovascular accident) Active Problems:   AF (atrial fibrillation)   Weakness of left leg   Warfarin anticoagulation   Chronic kidney disease, stage III (moderate)   PAD (peripheral artery disease)   Systolic CHF  Time spent: 2min  Stephen K Chiu  [facility] Pager [phone]. If 7PM-7AM, please contact night-coverage at [email or web address], password [facility] 02/20/2014, 8:03 AM  LOS: 3 days

## 2014-02-20 NOTE — Progress Notes (Addendum)
Clinical Social Work Department CLINICAL SOCIAL WORK PLACEMENT NOTE 02/20/2014  Patient:  Mitchell Farrell, Mitchell Farrell  Account Number:  [id number] [place] date:  02/17/2014  Clinical Social Worker:  Ky Barban, Latanya Presser  Date/time:  02/20/2014 01:50 PM  Clinical Social Work is seeking post-discharge placement for this patient at the following level of care:   SKILLED NURSING   (*CSW will update this form in Epic as items are completed)   N/A-CSWs made referrals to facilities requested by pt's family  Patient/family provided with [place] Department of Clinical Social Work's list of facilities offering this level of care within the geographic area requested by the patient (or if unable, by the patient's family).  02/20/2014  Patient/family informed of their freedom to choose among providers that offer the needed level of care, that participate in Medicare, Medicaid or managed care program needed by the patient, have an available bed and are willing to accept the patient.  N/A-not sent to this SNF  Patient/family informed of [facility]' ownership interest in [facility], as well as of the fact that they are under no obligation to receive care at this facility.  PASARR submitted to EDS on EXISTING PASARR number received from EDS on EXISTING  FL2 transmitted to all facilities in geographic area requested by pt/family on  02/20/2014 FL2 transmitted to all facilities within larger geographic area on   Patient informed that his/her managed care company has contracts with or will negotiate with  certain facilities, including the following:     Patient/family informed of bed offers received:  02/23/2014 Patient chooses bed at [facility] Physician recommends and patient chooses bed at    Patient to be transferred to  on  02/24/2014 Patient to be transferred to facility by EMS  The following physician request were entered in Epic:   Additional Comments:   Ky Barban, MSW,  [employer] Social Worker [phone]

## 2014-02-20 NOTE — Discharge Instructions (Signed)
Information on my medicine - Coumadin   (Warfarin)  Why was Coumadin prescribed for you? Coumadin was prescribed for you because you have a blood clot or a medical condition that can cause an increased risk of forming blood clots. Blood clots can cause serious health problems by blocking the flow of blood to the heart, lung, or brain. Coumadin can prevent harmful blood clots from forming. As a reminder your indication for Coumadin is:   Stroke Prevention Because Of Atrial Fibrillation  What test will check on my response to Coumadin? While on Coumadin (warfarin) you will need to have an INR test regularly to ensure that your dose is keeping you in the desired range. The INR (international normalized ratio) number is calculated from the result of the laboratory test called prothrombin time (PT).  If an INR APPOINTMENT HAS NOT ALREADY BEEN MADE FOR YOU please schedule an appointment to have this lab work done by your health care provider within 7 days. Your INR goal is usually a number between:  2 to 3.    What  do you need to  know  About  COUMADIN? Take Coumadin (warfarin) exactly as prescribed by your healthcare provider about the same time each day.  DO NOT stop taking without talking to the doctor who prescribed the medication.  Stopping without other blood clot prevention medication to take the place of Coumadin may increase your risk of developing a new clot or stroke.  Get refills before you run out.  What do you do if you miss a dose? If you miss a dose, take it as soon as you remember on the same day then continue your regularly scheduled regimen the next day.  Do not take two doses of Coumadin at the same time.  Important Safety Information A possible side effect of Coumadin (Warfarin) is an increased risk of bleeding. You should call your healthcare provider right away if you experience any of the following:   Bleeding from an injury or your nose that does not stop.   Unusual colored  urine (red or dark brown) or unusual colored stools (red or black).   Unusual bruising for unknown reasons.   A serious fall or if you hit your head (even if there is no bleeding).  Some foods or medicines interact with Coumadin (warfarin) and might alter your response to warfarin. To help avoid this:   Eat a balanced diet, maintaining a consistent amount of Vitamin K.   Notify your provider about major diet changes you plan to make.   Avoid alcohol or limit your intake to 1 drink for women and 2 drinks for men per day. (1 drink is 5 oz. wine, 12 oz. beer, or 1.5 oz. liquor.)  Make sure that ANY health care provider who prescribes medication for you knows that you are taking Coumadin (warfarin).  Also make sure the healthcare provider who is monitoring your Coumadin knows when you have started a new medication including herbals and non-prescription products.  Coumadin (Warfarin)  Major Drug Interactions  Increased Warfarin Effect Decreased Warfarin Effect  Alcohol (large quantities) Antibiotics (esp. Septra/Bactrim, Flagyl, Cipro) Amiodarone (Cordarone) Aspirin (ASA) Cimetidine (Tagamet) Megestrol (Megace) NSAIDs (ibuprofen, naproxen, etc.) Piroxicam (Feldene) Propafenone (Rythmol SR) Propranolol (Inderal) Isoniazid (INH) Posaconazole (Noxafil) Barbiturates (Phenobarbital) Carbamazepine (Tegretol) Chlordiazepoxide (Librium) Cholestyramine (Questran) Griseofulvin Oral Contraceptives Rifampin Sucralfate (Carafate) Vitamin K   Coumadin (Warfarin) Major Herbal Interactions  Increased Warfarin Effect Decreased Warfarin Effect  Garlic Ginseng Ginkgo biloba Coenzyme Q10 Green tea St.  Johns wort    Coumadin (Warfarin) FOOD Interactions  Eat a consistent number of servings per week of foods HIGH in Vitamin K (1 serving =  cup)  Collards (cooked, or boiled & drained) Kale (cooked, or boiled & drained) Mustard greens (cooked, or boiled & drained) Parsley *serving size only  =  cup Spinach (cooked, or boiled & drained) Swiss chard (cooked, or boiled & drained) Turnip greens (cooked, or boiled & drained)  Eat a consistent number of servings per week of foods MEDIUM-HIGH in Vitamin K (1 serving = 1 cup)  Asparagus (cooked, or boiled & drained) Broccoli (cooked, boiled & drained, or raw & chopped) Brussel sprouts (cooked, or boiled & drained) *serving size only =  cup Lettuce, raw (green leaf, endive, romaine) Spinach, raw Turnip greens, raw & chopped   These websites have more information on Coumadin (warfarin):  FailFactory.se; VeganReport.com.au;

## 2014-02-20 NOTE — Progress Notes (Signed)
Clinical Social Work Department BRIEF PSYCHOSOCIAL ASSESSMENT 02/20/2014  Patient:  Mitchell Farrell, Mitchell Farrell     Account Number:  [id number]     Admit date:  02/17/2014  Clinical Social Worker:  Freeman Caldron  Date/Time:  02/20/2014 01:46 PM  Referred by:  Physician  Date Referred:  02/20/2014 Referred for  SNF Placement   Other Referral:   Interview type:  Patient Other interview type:   Also spoke with pt's daughter    PSYCHOSOCIAL DATA Living Status:  FAMILY Admitted from facility:   Level of care:   Primary support name:  [name] [phone]) Primary support relationship to patient:  CHILD, ADULT Degree of support available:   Good--pt has support from daughter.    CURRENT CONCERNS Current Concerns  Post-Acute Placement   Other Concerns:    SOCIAL WORK ASSESSMENT / PLAN CSW explained recommendation for SNF and pt stated he was interested in [facility] and one other place but he could not remember the name. Pt asked CSW to call his wife. CSW did not see phone number listed for spouse, so CSW called daughter. Daughter states she is his Advice worker. Daughter wanted CSW to make referral to [facility] as well. ED CSW also spoke with pt's daughter and daughter requested referrals made to a few other facilities; ED CSW did this. Will follow up with bed offers once facilities respond to request.   Assessment/plan status:  Psychosocial Support/Ongoing Assessment of Needs Other assessment/ plan:   Information/referral to community resources:   SNF    PATIENT'S/FAMILY'S RESPONSE TO PLAN OF CARE: Good--pt participated in conversation with CSW and understands CSW role. Daughter thanked CSW for assistance.       Ky Barban, MSW, [facility] Clinical Social Worker [phone]

## 2014-02-21 DIAGNOSIS — R109 Unspecified abdominal pain: Secondary | ICD-10-CM

## 2014-02-21 LAB — PROTIME-INR
INR: 2.96 — ABNORMAL HIGH (ref 0.00–1.49)
Prothrombin Time: 29.8 seconds — ABNORMAL HIGH (ref 11.6–15.2)

## 2014-02-21 NOTE — Progress Notes (Signed)
[facility] PROGRESS NOTE  Mitchell Farrell GHW:[id number] DOB: [birth year] DOA: 02/17/2014 PCP: Elsie Stain, MD  Assessment/Plan: 1. L sided weakness secondary to lumbar spinal stenosis 1. Neurology was consulted initially with concerns of possible CVA 2. Head CT unremarkable 3. On therapeutic coumadin 4. MRI brain neg for CVA 5. Carotid Dopplers without stenosis 6. 2D echo with EF 55-60% with mod LVH 7. Therapy recs for SNF 8. Lumbar MRI revealed mod-mod severe disease primarily at L4-5 with mod-mod severe foraminal narrowing. Remote L1 fracture noted 9. Neurology recommendations for continued PT/OT 2. Afib 1. Remains rate controlled 2. For now, monitor closely 3. On coreg per home regimen, will cont 3. Stage 3CKD 1. Appears stable 4. Chronic systolic chf 1. Euvolemic, no signs of vol overload 5. DVT prophylaxis 1. Therapeutic coumadin  Code Status: DNR Family Communication: Pt in room, discussed with pt's daughter on 5/15 Disposition Plan: Pending SNF placement   Consultants:  Neurology  Procedures:    Antibiotics:     HPI/Subjective: No complaints. Reports feeling well  Objective: Filed Vitals:   02/20/14 1526 02/20/14 1529 02/20/14 2018 02/21/14 0352  BP: 103/58  99/53 104/56  Pulse: 58 62 55 49  Temp: 98 F (36.7 C)  98.1 F (36.7 C) 98.4 F (36.9 C)  TempSrc: Oral  Oral Oral  Resp: 18  18 18   Height:      Weight:      SpO2: 98%  98% 100%    Intake/Output Summary (Last 24 hours) at 02/21/14 0755 Last data filed at 02/21/14 0558  Gross per 24 hour  Intake    480 ml  Output   1000 ml  Net   -520 ml   Filed Weights   02/17/14 2231  Weight: 72.576 kg (160 lb)    Exam:   General:  Awake, in nad  Cardiovascular: bradycardic, s1, s2  Respiratory: normal resp effort, no wheezing or crackles  Abdomen: soft, nondistended  Musculoskeletal: perfused, no clubbing, no edema  Data Reviewed: Basic Metabolic Panel:  Recent Labs Lab  02/17/14 2344 02/17/14 2355  NA 141 142  K 3.9 3.7  CL 105 105  CO2 24  --   GLUCOSE 97 100*  BUN 12 11  CREATININE 1.30 1.50*  CALCIUM 9.0  --    Liver Function Tests:  Recent Labs Lab 02/17/14 2344  AST 23  ALT 10  ALKPHOS 81  BILITOT 0.4  PROT 7.6  ALBUMIN 3.2*   No results found for this basename: LIPASE, AMYLASE,  in the last 168 hours No results found for this basename: AMMONIA,  in the last 168 hours CBC:  Recent Labs Lab 02/17/14 2344 02/17/14 2355  WBC 5.9  --   NEUTROABS 4.8  --   HGB 13.2 15.0  HCT 42.0 44.0  MCV 86.6  --   PLT 158  --    Cardiac Enzymes: No results found for this basename: CKTOTAL, CKMB, CKMBINDEX, TROPONINI,  in the last 168 hours BNP (last 3 results) No results found for this basename: PROBNP,  in the last 8760 hours CBG:  Recent Labs Lab 02/17/14 2228  GLUCAP 124*    No results found for this or any previous visit (from the past 240 hour(s)).   Studies: Dg Chest 2 View  02/19/2014   CLINICAL DATA:  Stroke  EXAM: CHEST - 2 VIEW  COMPARISON:  08/10/2010  FINDINGS: Low lung volumes. Patchy airspace opacities in the lung bases right greater than left, new since previous exam. No  definite effusion. Heart size upper limits normal. .  Spondylitic changes in the lower lumbar spine P  IMPRESSION: New bibasilar patchy atelectasis or infiltrates, right greater than left.   Electronically Signed   By: Arne Cleveland M.D.   On: 02/19/2014 15:55   Mr Lumbar Spine Wo Contrast  02/19/2014   CLINICAL DATA:  Chronic left lower extremity weakness with acute worsening today. Left leg numbness.  EXAM: MRI LUMBAR SPINE WITHOUT CONTRAST  TECHNIQUE: Multiplanar, multisequence MR imaging of the lumbar spine was performed. No intravenous contrast was administered.  COMPARISON:  Plain films lumbar spine 03/18/2012.  FINDINGS: Remote L1 fracture is seen as on prior plain films. No acute fracture is identified. Multilevel degenerative endplate signal  change is seen. No worrisome marrow lesion is present. The conus medullaris is normal in signal and position. Imaged intra-abdominal contents are unremarkable.  The T10-11 and T11-12 levels are imaged in the sagittal plane only and negative.  L1-2: Minimal bony retropulsion without central canal or foraminal narrowing.  L1-2: Facet degenerative disease and a shallow disc bulge without central canal or foraminal narrowing.  L2-3: Annular tear and a tiny central protrusion without central canal or foraminal stenosis.  L3-4: Ligamentum flavum thickening is present. Disc and endplate spur are identified. There is mild to moderate central canal narrowing with narrowing in the right lateral recess. The foramina are open.  L4-5: Ligamentum flavum thickening is present and there is facet degenerative change. A mild disc bulge is seen. There is moderate to moderately severe central canal stenosis and narrowing in the lateral recesses, worse on the right. Right foraminal narrowing is also identified. The left foramen appears open.  L5-S1: There is a shallow disc bulge with ligamentum flavum thickening. The central canal and lateral recesses are mildly narrowed. Moderate to moderately severe foraminal narrowing is worse on the right.  IMPRESSION: No acute finding.  Remote L1 fracture without associated central canal compromise.  Moderate to moderately severe central canal stenosis and right worse than left lateral recess narrowing L4-5. There is also right foraminal narrowing at this level.  Mild central canal and lateral recess narrowing L5-S1 where there is also bilateral foraminal narrowing, worse on the right.   Electronically Signed   By: Inge Rise M.D.   On: 02/19/2014 15:36    Scheduled Meds: . carvedilol  3.125 mg Oral BID WC  . Difluprednate  1 drop Right Eye BID  . doxycycline  100 mg Oral BID  . gabapentin  100-300 mg Oral QHS  . gatifloxacin  1 drop Right Eye QID  . ketorolac  1 drop Right Eye QID   . levothyroxine  88 mcg Oral QAC breakfast  . warfarin  2.5 mg Oral q1800  . Warfarin - Physician Dosing Inpatient   Does not apply q1800   Continuous Infusions:    Principal Problem:   Acute CVA (cerebrovascular accident) Active Problems:   AF (atrial fibrillation)   Weakness of left leg   Warfarin anticoagulation   Chronic kidney disease, stage III (moderate)   PAD (peripheral artery disease)   Systolic CHF  Time spent: 69min  Stephen K Chiu  [facility] Pager [phone]. If 7PM-7AM, please contact night-coverage at [email or web address], password [facility] 02/21/2014, 7:55 AM  LOS: 4 days

## 2014-02-22 DIAGNOSIS — M48061 Spinal stenosis, lumbar region without neurogenic claudication: Principal | ICD-10-CM

## 2014-02-22 LAB — BASIC METABOLIC PANEL
BUN: 29 mg/dL — AB (ref 6–23)
CALCIUM: 8.1 mg/dL — AB (ref 8.4–10.5)
CHLORIDE: 108 meq/L (ref 96–112)
CO2: 22 meq/L (ref 19–32)
CREATININE: 1.34 mg/dL (ref 0.50–1.35)
GFR calc Af Amer: 51 mL/min — ABNORMAL LOW (ref >90)
GFR calc non Af Amer: 44 mL/min — ABNORMAL LOW (ref >90)
GLUCOSE: 96 mg/dL (ref 70–99)
Potassium: 4.4 mEq/L (ref 3.7–5.3)
Sodium: 138 mEq/L (ref 137–147)

## 2014-02-22 LAB — CBC
HEMATOCRIT: 35.4 % — AB (ref 39.0–52.0)
Hemoglobin: 11.4 g/dL — ABNORMAL LOW (ref 13.0–17.0)
MCH: 27.3 pg (ref 26.0–34.0)
MCHC: 32.2 g/dL (ref 30.0–36.0)
MCV: 84.7 fL (ref 78.0–100.0)
Platelets: 175 10*3/uL (ref 150–400)
RBC: 4.18 MIL/uL — AB (ref 4.22–5.81)
RDW: 14.8 % (ref 11.5–15.5)
WBC: 4.9 10*3/uL (ref 4.0–10.5)

## 2014-02-22 LAB — PROTIME-INR
INR: 2.86 — ABNORMAL HIGH (ref 0.00–1.49)
Prothrombin Time: 29 seconds — ABNORMAL HIGH (ref 11.6–15.2)

## 2014-02-22 MED ORDER — WARFARIN - PHARMACIST DOSING INPATIENT: Freq: Every day | Status: DC

## 2014-02-22 NOTE — Progress Notes (Signed)
[facility] PROGRESS NOTE  Dragon Thrush FYB:[id number] DOB: [birth year] DOA: 02/17/2014 PCP: Elsie Stain, MD  Assessment/Plan: 1. L sided weakness secondary to lumbar spinal stenosis 1. Neurology was consulted initially with concerns of possible CVA 2. Head CT unremarkable 3. On therapeutic coumadin 4. MRI brain neg for CVA 5. Carotid Dopplers without stenosis 6. 2D echo with EF 55-60% with mod LVH 7. Therapy recs for SNF 8. Lumbar MRI revealed mod-mod severe disease primarily at L4-5 with mod-mod severe foraminal narrowing. Remote L1 fracture noted 9. Neurology recommendations for continued PT/OT 10. Pending SNF placement 11. LLE gradually increasing in strength 2. Afib 1. Remains rate controlled, stable 2. On coreg per home regimen, will cont 3. Stage 3CKD 1. Appears stable 4. Chronic systolic chf 1. Euvolemic, no signs of vol overload 5. DVT prophylaxis 1. Therapeutic coumadin 2. Pharmacy dosing  Code Status: DNR Family Communication: Pt in room, discussed with pt's daughter at bedside on 5/15 Disposition Plan: Pending SNF placement   Consultants:  Neurology  Procedures:    Antibiotics:     HPI/Subjective: No complaints. Feels better today  Objective: Filed Vitals:   02/21/14 1743 02/21/14 2043 02/22/14 0423 02/22/14 0612  BP:  101/58 106/55 130/60  Pulse: 84 52 54 68  Temp:  98.4 F (36.9 C) 98.1 F (36.7 C)   TempSrc:  Oral Oral   Resp:  17 16   Height:      Weight:      SpO2:  97% 98%     Intake/Output Summary (Last 24 hours) at 02/22/14 0749 Last data filed at 02/22/14 0423  Gross per 24 hour  Intake    720 ml  Output    600 ml  Net    120 ml   Filed Weights   02/17/14 2231  Weight: 72.576 kg (160 lb)    Exam:   General:  Awake, in nad  Cardiovascular: bradycardic, s1, s2  Respiratory: normal resp effort, no wheezing or crackles  Abdomen: soft, nondistended  Musculoskeletal: perfused, no clubbing, no edema  Data  Reviewed: Basic Metabolic Panel:  Recent Labs Lab 02/17/14 2344 02/17/14 2355 02/22/14 0655  NA 141 142 138  K 3.9 3.7 4.4  CL 105 105 108  CO2 24  --  22  GLUCOSE 97 100* 96  BUN 12 11 29*  CREATININE 1.30 1.50* 1.34  CALCIUM 9.0  --  8.1*   Liver Function Tests:  Recent Labs Lab 02/17/14 2344  AST 23  ALT 10  ALKPHOS 81  BILITOT 0.4  PROT 7.6  ALBUMIN 3.2*   No results found for this basename: LIPASE, AMYLASE,  in the last 168 hours No results found for this basename: AMMONIA,  in the last 168 hours CBC:  Recent Labs Lab 02/17/14 2344 02/17/14 2355 02/22/14 0655  WBC 5.9  --  4.9  NEUTROABS 4.8  --   --   HGB 13.2 15.0 11.4*  HCT 42.0 44.0 35.4*  MCV 86.6  --  84.7  PLT 158  --  175   Cardiac Enzymes: No results found for this basename: CKTOTAL, CKMB, CKMBINDEX, TROPONINI,  in the last 168 hours BNP (last 3 results) No results found for this basename: PROBNP,  in the last 8760 hours CBG:  Recent Labs Lab 02/17/14 2228  GLUCAP 124*    No results found for this or any previous visit (from the past 240 hour(s)).   Studies: No results found.  Scheduled Meds: . carvedilol  3.125 mg Oral BID  WC  . Difluprednate  1 drop Right Eye BID  . doxycycline  100 mg Oral BID  . gabapentin  100-300 mg Oral QHS  . gatifloxacin  1 drop Right Eye QID  . ketorolac  1 drop Right Eye QID  . levothyroxine  88 mcg Oral QAC breakfast  . warfarin  2.5 mg Oral q1800  . Warfarin - Physician Dosing Inpatient   Does not apply q1800   Continuous Infusions:    Principal Problem:   Acute CVA (cerebrovascular accident) Active Problems:   AF (atrial fibrillation)   Weakness of left leg   Warfarin anticoagulation   Chronic kidney disease, stage III (moderate)   PAD (peripheral artery disease)   Systolic CHF  Time spent: 23min  Stephen K Chiu  [facility] Pager [phone]. If 7PM-7AM, please contact night-coverage at [email or web address], password [facility] 02/22/2014,  7:49 AM  LOS: 5 days

## 2014-02-22 NOTE — Progress Notes (Signed)
ANTICOAGULATION CONSULT NOTE   Pharmacy Consult for coumadin  Indication: atrial fibrillation, stroke prevention  No Known Allergies  Patient Measurements: Height: 5\' 7"  (170.2 cm) Weight: 160 lb (72.576 kg) IBW/kg (Calculated) : 66.1  Labs:  Recent Labs  02/20/14 0341 02/21/14 0320 02/22/14 0655  HGB  --   --  11.4*  HCT  --   --  35.4*  PLT  --   --  175  LABPROT 26.3* 29.8* 29.0*  INR 2.52* 2.96* 2.86*  CREATININE  --   --  1.34    Estimated Creatinine Clearance: 32.9 ml/min (by C-G formula based on Cr of 1.34).  Assessment: 80 year old male admitted with LLE weakness. Patient on chronic coumadin for history of afib, he has been stable on 2.5mg  daily for quite some time and has been continued on that dose while admitted. INR continues to be at goal will not change dose, if stable again in am will change to INR checks on MWF only.  Goal of Therapy:  INR 2-3 Monitor platelets by anticoagulation protocol: Yes   Plan:  Continue warfarin 2.5mg  daily INR in am  Erin Hearing PharmD., BCPS Clinical Pharmacist Pager [phone] 02/22/2014 11:11 AM

## 2014-02-23 ENCOUNTER — Ambulatory Visit: Payer: Federal, State, Local not specified - PPO

## 2014-02-23 DIAGNOSIS — M1990 Unspecified osteoarthritis, unspecified site: Secondary | ICD-10-CM

## 2014-02-23 DIAGNOSIS — E039 Hypothyroidism, unspecified: Secondary | ICD-10-CM

## 2014-02-23 LAB — PROTIME-INR
INR: 2.8 — ABNORMAL HIGH (ref 0.00–1.49)
Prothrombin Time: 28.5 seconds — ABNORMAL HIGH (ref 11.6–15.2)

## 2014-02-23 NOTE — Clinical Social Work Note (Signed)
Spoke with patient's family and they have selected SNF bed at [place] contacted the SNF rep to expedite [employer] employee PPO auth for transfer and advised them of dc for today-  Will await their follow up/update and advise-  Eduard Clos, MSW, [place]

## 2014-02-23 NOTE — Discharge Summary (Addendum)
Physician Discharge Summary  Hariharan Kloos [id number] DOB: September 13, 1919 DOA: 02/17/2014  PCP: Elsie Stain, MD  Admit date: 02/17/2014 Discharge date: 02/24/2014  Time spent: 35 minutes  Recommendations for Outpatient Follow-up:  1. Follow up with PCP in 1-2 weeks 2. Follow INR - would repeat next INR within one week  Discharge Diagnoses:  Principal Problem:   Acute CVA (cerebrovascular accident) Active Problems:   AF (atrial fibrillation)   Weakness of left leg   Warfarin anticoagulation   Chronic kidney disease, stage III (moderate)   PAD (peripheral artery disease)   Systolic CHF   Discharge Condition: Stable  Diet recommendation: Heart healthy  Filed Weights   02/17/14 2231  Weight: 72.576 kg (160 lb)    History of present illness:  80 yo male lives alone comes in with sudden lle weakness at 915pm. Denies any problems with his other leg or arms. No facial drooping or slurring. No recent fevers. No headache. No n/v/d. Since arrival to ED his left leg is starting to move some, better than before. He had cataract surgery this past Monday to his right eye. He is on coumadin.   Hospital Course:  1. L sided weakness secondary to lumbar spinal stenosis  1. Neurology was consulted initially with concerns of possible CVA 2. Head CT unremarkable 3. On therapeutic coumadin 4. MRI brain neg for CVA 5. Carotid Dopplers without stenosis 6. 2D echo with EF 55-60% with mod LVH 7. Therapy recs for SNF 8. Lumbar MRI revealed mod-mod severe disease primarily at L4-5 with mod-mod severe foraminal narrowing. Remote L1 fracture noted 9. Neurology recommendations for continued PT/OT and continue therapeutic coumadin 10. LLE gradually increasing in strength 2. Afib  1. Remains rate controlled, stable 2. On coreg per home regimen, will cont 3. Stage 3CKD  1. Appears stable 4. Chronic systolic chf  1. Euvolemic, no signs of vol overload 5. DVT prophylaxis  1. Therapeutic  coumadin 2. Pharmacy dosing while in the hospital  Consultations:  Neurology  Discharge Exam: Filed Vitals:   02/22/14 1443 02/22/14 1609 02/22/14 2153 02/23/14 0528  BP: 108/56  109/57 105/55  Pulse: 66 55 52 57  Temp: 97.3 F (36.3 C)  98.2 F (36.8 C) 98.7 F (37.1 C)  TempSrc: Oral  Oral Oral  Resp: 18  20 20   Height:      Weight:      SpO2: 100%  100% 98%    General: Awake, in nad Cardiovascular: Regular, s1, s2 Respiratory: normal resp effort, no wheezing  Discharge Instructions     Medication List    STOP taking these medications       doxycycline 100 MG tablet  Commonly known as:  VIBRA-TABS      TAKE these medications       BESIVANCE 0.6 % Susp  Generic drug:  Besifloxacin HCl  Place 1 drop into the right eye 2 (two) times daily.     carvedilol 3.125 MG tablet  Commonly known as:  COREG  Take 1 tablet (3.125 mg total) by mouth 2 (two) times daily with a meal.     DUREZOL 0.05 % Emul  Generic drug:  Difluprednate  Place 1 drop into the right eye 2 (two) times daily.     gabapentin 100 MG capsule  Commonly known as:  NEURONTIN  Take 100-300 mg by mouth at bedtime.     levothyroxine 88 MCG tablet  Commonly known as:  SYNTHROID, LEVOTHROID  Take 1 tablet (88 mcg total) by mouth  daily.     PROLENSA 0.07 % Soln  Generic drug:  Bromfenac Sodium  Place 1 drop into the right eye daily.     warfarin 5 MG tablet  Commonly known as:  COUMADIN  Take 2.5 mg by mouth daily.       No Known Allergies Follow-up Information   Follow up with Elsie Stain, MD. Schedule an appointment as soon as possible for a visit in 1 week.   Specialty:  Family Medicine   Contact information:   [place] [id number] [phone]        The results of significant diagnostics from this hospitalization (including imaging, microbiology, ancillary and laboratory) are listed below for reference.    Significant Diagnostic Studies: Dg Chest 2  View  02/19/2014   CLINICAL DATA:  Stroke  EXAM: CHEST - 2 VIEW  COMPARISON:  08/10/2010  FINDINGS: Low lung volumes. Patchy airspace opacities in the lung bases right greater than left, new since previous exam. No definite effusion. Heart size upper limits normal. .  Spondylitic changes in the lower lumbar spine P  IMPRESSION: New bibasilar patchy atelectasis or infiltrates, right greater than left.   Electronically Signed   By: Arne Cleveland M.D.   On: 02/19/2014 15:55   Ct Head Wo Contrast  02/17/2014   CLINICAL DATA:  Left leg weakness  EXAM: CT HEAD WITHOUT CONTRAST  TECHNIQUE: Contiguous axial images were obtained from the base of the skull through the vertex without intravenous contrast.  COMPARISON:  None.  FINDINGS: The bony calvarium is intact. Chronic postsurgical changes are noted in the maxillary antra. Diffuse mucosal thickening is noted within the right maxillary antrum.  Mild atrophic changes are noted. Chronic white matter ischemic change is seen. No findings to suggest acute hemorrhage, acute infarction or space-occupying mass lesion is noted. A calcification is noted in the right half of the pons of uncertain chronicity.  IMPRESSION: Chronic changes without acute abnormality.  These results were called by telephone at the time of interpretation on 02/17/2014 at 11:59 PM to Dr. Armida Sans, who verbally acknowledged these results.   Electronically Signed   By: Inez Catalina M.D.   On: 02/17/2014 23:59   Mr Jodene Nam Head Wo Contrast  02/18/2014   CLINICAL DATA:  Stroke  EXAM: MRI HEAD WITHOUT CONTRAST  MRA HEAD WITHOUT CONTRAST  TECHNIQUE: Multiplanar, multiecho pulse sequences of the brain and surrounding structures were obtained without intravenous contrast. Angiographic images of the head were obtained using MRA technique without contrast.  COMPARISON:  Prior CT from 02/17/2014.  FINDINGS: MRI HEAD FINDINGS  Study is degraded by motion artifact.  Generalized cerebral atrophy with moderate chronic  microvascular ischemic disease involving the periventricular and deep white matter both cerebral hemispheres is present. White matter changes also noted within the pons.  No focal parenchymal signal abnormality is identified. No mass lesion, midline shift, or extra-axial fluid collection. Ventricles are normal in size without evidence of hydrocephalus.  No diffusion-weighted signal abnormality is identified to suggest acute intracranial infarct. Gray-white matter differentiation is maintained. Normal flow voids are seen within the intracranial vasculature. No intracranial hemorrhage identified.  The cervicomedullary junction is normal. Pituitary gland is within normal limits. Pituitary stalk is midline. The globes and optic nerves demonstrate a normal appearance with normal signal intensity. The  The bone marrow signal intensity is normal. Calvarium is intact. Visualized upper cervical spine is within normal limits.  Scalp soft tissues are unremarkable.  Moderate mucosal thickening present  within the right maxillary sinus. Scattered mucosal thickening also noted within the ethmoidal air cells on the right. Paranasal sinuses are otherwise clear. No mastoid effusion.  MRA HEAD FINDINGS  The visualized portions of the distal cervical internal carotid arteries are widely patent with antegrade flow. The petrous, cavernous, and supra clinoid segments are symmetric in caliber bilaterally. A1 segments, anterior communicating artery, and anterior cerebral arteries are widely patent. Middle cerebral arteries are widely patent with antegrade flow without high-grade flow-limiting stenosis or proximal branch occlusion. Distal MCA branches are symmetric bilaterally. No intracranial aneurysm within the anterior circulation.  The vertebral arteries are widely patent with antegrade flow. The posterior inferior cerebral arteries are normal. Vertebrobasilar junction and basilar artery are widely patent with antegrade flow without  evidence of basilar tip stenosis or aneurysm. Posterior cerebral arteries are normal bilaterally. The superior cerebellar arteries and anterior inferior cerebellar arteries are widely patent bilaterally. No intracranial aneurysm within the posterior circulation.  IMPRESSION: MRI HEAD IMPRESSION:  1. No acute intracranial infarct or other abnormality identified. 2. Generalized cerebral atrophy with moderate chronic microvascular ischemic disease. 3. Moderate right maxillary sinus disease.  MRA HEAD IMPRESSION:  Unremarkable MRA of the brain with no evidence of proximal branch occlusion or high-grade flow-limiting stenosis.   Electronically Signed   By: Jeannine Boga M.D.   On: 02/18/2014 23:28   Mr Brain Wo Contrast  02/18/2014   CLINICAL DATA:  Stroke  EXAM: MRI HEAD WITHOUT CONTRAST  MRA HEAD WITHOUT CONTRAST  TECHNIQUE: Multiplanar, multiecho pulse sequences of the brain and surrounding structures were obtained without intravenous contrast. Angiographic images of the head were obtained using MRA technique without contrast.  COMPARISON:  Prior CT from 02/17/2014.  FINDINGS: MRI HEAD FINDINGS  Study is degraded by motion artifact.  Generalized cerebral atrophy with moderate chronic microvascular ischemic disease involving the periventricular and deep white matter both cerebral hemispheres is present. White matter changes also noted within the pons.  No focal parenchymal signal abnormality is identified. No mass lesion, midline shift, or extra-axial fluid collection. Ventricles are normal in size without evidence of hydrocephalus.  No diffusion-weighted signal abnormality is identified to suggest acute intracranial infarct. Gray-white matter differentiation is maintained. Normal flow voids are seen within the intracranial vasculature. No intracranial hemorrhage identified.  The cervicomedullary junction is normal. Pituitary gland is within normal limits. Pituitary stalk is midline. The globes and optic  nerves demonstrate a normal appearance with normal signal intensity. The  The bone marrow signal intensity is normal. Calvarium is intact. Visualized upper cervical spine is within normal limits.  Scalp soft tissues are unremarkable.  Moderate mucosal thickening present within the right maxillary sinus. Scattered mucosal thickening also noted within the ethmoidal air cells on the right. Paranasal sinuses are otherwise clear. No mastoid effusion.  MRA HEAD FINDINGS  The visualized portions of the distal cervical internal carotid arteries are widely patent with antegrade flow. The petrous, cavernous, and supra clinoid segments are symmetric in caliber bilaterally. A1 segments, anterior communicating artery, and anterior cerebral arteries are widely patent. Middle cerebral arteries are widely patent with antegrade flow without high-grade flow-limiting stenosis or proximal branch occlusion. Distal MCA branches are symmetric bilaterally. No intracranial aneurysm within the anterior circulation.  The vertebral arteries are widely patent with antegrade flow. The posterior inferior cerebral arteries are normal. Vertebrobasilar junction and basilar artery are widely patent with antegrade flow without evidence of basilar tip stenosis or aneurysm. Posterior cerebral arteries are normal bilaterally. The superior cerebellar arteries and  anterior inferior cerebellar arteries are widely patent bilaterally. No intracranial aneurysm within the posterior circulation.  IMPRESSION: MRI HEAD IMPRESSION:  1. No acute intracranial infarct or other abnormality identified. 2. Generalized cerebral atrophy with moderate chronic microvascular ischemic disease. 3. Moderate right maxillary sinus disease.  MRA HEAD IMPRESSION:  Unremarkable MRA of the brain with no evidence of proximal branch occlusion or high-grade flow-limiting stenosis.   Electronically Signed   By: Jeannine Boga M.D.   On: 02/18/2014 23:28   Mr Lumbar Spine Wo  Contrast  02/19/2014   CLINICAL DATA:  Chronic left lower extremity weakness with acute worsening today. Left leg numbness.  EXAM: MRI LUMBAR SPINE WITHOUT CONTRAST  TECHNIQUE: Multiplanar, multisequence MR imaging of the lumbar spine was performed. No intravenous contrast was administered.  COMPARISON:  Plain films lumbar spine 03/18/2012.  FINDINGS: Remote L1 fracture is seen as on prior plain films. No acute fracture is identified. Multilevel degenerative endplate signal change is seen. No worrisome marrow lesion is present. The conus medullaris is normal in signal and position. Imaged intra-abdominal contents are unremarkable.  The T10-11 and T11-12 levels are imaged in the sagittal plane only and negative.  L1-2: Minimal bony retropulsion without central canal or foraminal narrowing.  L1-2: Facet degenerative disease and a shallow disc bulge without central canal or foraminal narrowing.  L2-3: Annular tear and a tiny central protrusion without central canal or foraminal stenosis.  L3-4: Ligamentum flavum thickening is present. Disc and endplate spur are identified. There is mild to moderate central canal narrowing with narrowing in the right lateral recess. The foramina are open.  L4-5: Ligamentum flavum thickening is present and there is facet degenerative change. A mild disc bulge is seen. There is moderate to moderately severe central canal stenosis and narrowing in the lateral recesses, worse on the right. Right foraminal narrowing is also identified. The left foramen appears open.  L5-S1: There is a shallow disc bulge with ligamentum flavum thickening. The central canal and lateral recesses are mildly narrowed. Moderate to moderately severe foraminal narrowing is worse on the right.  IMPRESSION: No acute finding.  Remote L1 fracture without associated central canal compromise.  Moderate to moderately severe central canal stenosis and right worse than left lateral recess narrowing L4-5. There is also right  foraminal narrowing at this level.  Mild central canal and lateral recess narrowing L5-S1 where there is also bilateral foraminal narrowing, worse on the right.   Electronically Signed   By: Inge Rise M.D.   On: 02/19/2014 15:36    Microbiology: No results found for this or any previous visit (from the past 240 hour(s)).   Labs: Basic Metabolic Panel:  Recent Labs Lab 02/17/14 2344 02/17/14 2355 02/22/14 0655  NA 141 142 138  K 3.9 3.7 4.4  CL 105 105 108  CO2 24  --  22  GLUCOSE 97 100* 96  BUN 12 11 29*  CREATININE 1.30 1.50* 1.34  CALCIUM 9.0  --  8.1*   Liver Function Tests:  Recent Labs Lab 02/17/14 2344  AST 23  ALT 10  ALKPHOS 81  BILITOT 0.4  PROT 7.6  ALBUMIN 3.2*   No results found for this basename: LIPASE, AMYLASE,  in the last 168 hours No results found for this basename: AMMONIA,  in the last 168 hours CBC:  Recent Labs Lab 02/17/14 2344 02/17/14 2355 02/22/14 0655  WBC 5.9  --  4.9  NEUTROABS 4.8  --   --   HGB 13.2 15.0  11.4*  HCT 42.0 44.0 35.4*  MCV 86.6  --  84.7  PLT 158  --  175   Cardiac Enzymes: No results found for this basename: CKTOTAL, CKMB, CKMBINDEX, TROPONINI,  in the last 168 hours BNP: BNP (last 3 results) No results found for this basename: PROBNP,  in the last 8760 hours CBG:  Recent Labs Lab 02/17/14 2228  GLUCAP 124*       Signed:  Donne Hazel  [facility] 02/23/2014, 10:45 AM

## 2014-02-23 NOTE — Progress Notes (Signed)
Physical Therapy Treatment Patient Details Name: Mitchell Farrell MRN: [id number] DOB: 05/18/1919 Today's Date: 2014/03/15    History of Present Illness Pt admit to r/o CVA secondary to increasion left LE weakness.  MRI negative.  w/u for lumbar MRI to r/o compression.  MRI showed L4/5 narrowing and old L1 fracture.    PT Comments    Pt continues to have lower extremity weakness and needs ST-SNF.  Follow Up Recommendations  SNF     Equipment Recommendations  None recommended by PT    Recommendations for Other Services       Precautions / Restrictions Precautions Precautions: Fall    Mobility  Bed Mobility Overal bed mobility: Needs Assistance Bed Mobility: Sit to Supine     Supine to sit: Mod assist     General bed mobility comments: assist to bring legs back up into bed.  Transfers Overall transfer level: Needs assistance Equipment used: Rolling walker (2 wheeled) Charlaine Dalton) Transfers: Sit to/from [employer] to Stand: Max assist Stand pivot transfers: Mod assist       General transfer comment: Assist to bring hips up from low surface. Used stedy for pivot to chair. Pt able to achieve much more upright posture with STedy. With walker pt with flexed knees, hips, and trunk.  Ambulation/Gait                 Stairs            Wheelchair Mobility    Modified Rankin (Stroke Patients Only)       Balance           Standing balance support: Bilateral upper extremity supported Standing balance-Leahy Scale: Poor                      Cognition Arousal/Alertness: Awake/alert Behavior During Therapy: WFL for tasks assessed/performed Overall Cognitive Status: Within Functional Limits for tasks assessed                      Exercises      General Comments        Pertinent Vitals/Pain No c/o's    Home Living                      Prior Function            PT Goals (current goals can now be  found in the care plan section) Progress towards PT goals: Progressing toward goals    Frequency  Min 2X/week    PT Plan Current plan remains appropriate;Frequency needs to be updated    Co-evaluation             End of Session Equipment Utilized During Treatment: Gait belt Charlaine Dalton) Activity Tolerance: Patient limited by fatigue Patient left: in bed;with call bell/phone within reach;with bed alarm set     Time: 1450-1515 PT Time Calculation (min): 25 min  Charges:  $Gait Training: 23-37 mins                    G CodesShary Decamp Beuford Garcilazo March 15, 2014, 4:24 PM  [employer] PT [phone]

## 2014-02-23 NOTE — Progress Notes (Signed)
ANTICOAGULATION CONSULT NOTE - Follow Up Consult  Pharmacy Consult for Coumadin Indication: atrial fibrillation  No Known Allergies  Patient Measurements: Height: 5\' 7"  (170.2 cm) Weight: 160 lb (72.576 kg) IBW/kg (Calculated) : 66.1 Heparin Dosing Weight:   Vital Signs: Temp: 98.7 F (37.1 C) (05/18 0528) Temp src: Oral (05/18 0528) BP: 105/55 mmHg (05/18 0528) Pulse Rate: 57 (05/18 0528)  Labs:  Recent Labs  02/21/14 0320 02/22/14 0655 02/23/14 0421  HGB  --  11.4*  --   HCT  --  35.4*  --   PLT  --  175  --   LABPROT 29.8* 29.0* 28.5*  INR 2.96* 2.86* 2.80*  CREATININE  --  1.34  --     Estimated Creatinine Clearance: 32.9 ml/min (by C-G formula based on Cr of 1.34).   Assessment: Admitted for possible CVA, LLE weakness  Anticoagulation: Warf for hx AFib and h/o CVA (on PTA); INR 2.71- PTA regimen: 2.5mg  daily. INR 2.8 today.  Infectious Disease: Afeb, WBC 4.9. On doxy pta, continued Doxy PO 5/12>>  Cardiovascular: hx Afib; lipid panel ok, LDL 29. BP 105/55 with HR only 52-66. on Coreg. Echo Shows ef 55%  Endo: A1c 5.4, glucose 100, synthroid. Cataract surgery just done 5/11 (Monday). Home eye drops ordered (some subs by [place])  GI/Nutr: LFTs ok, albumin 3.2  Neurology: hx CVA - admitted for CVA w/u; CT head - no acute abnormality; per Neuro - INR therapeutic but CVA still possible - Stroke team notes improving gradually. Lumbar spinal stenosis with mod-severe disease. - on home Neurontin qhs with range (100-300 mg qhs), took 100 mg last night  Nephrology: CKD - SCr 1.3  Hematology / Oncology: Hgb 11.4, plt 175   PTA Medication Issues: meds ordered  Best Practices: Coumadin   Goal of Therapy:  INR 2-3 Monitor platelets by anticoagulation protocol: Yes   Plan:  Coumadin 2.5mg  daily Change INR to MWF   Amandajo Gonder S. Alford Highland, PharmD, [facility] Clinical Staff Pharmacist Pager [phone]  [facility] Alford Highland 02/23/2014,10:12 AM

## 2014-02-24 NOTE — Progress Notes (Signed)
[facility] PROGRESS NOTE  Mitchell Farrell GGY:[id number] DOB: 06/13/1919 DOA: 02/17/2014 PCP: Elsie Stain, MD  Assessment/Plan: 1. L sided weakness secondary to lumbar spinal stenosis 1. Neurology was consulted initially with concerns of possible CVA 2. Head CT unremarkable 3. On therapeutic coumadin 4. MRI brain neg for CVA 5. Carotid Dopplers without stenosis 6. 2D echo with EF 55-60% with mod LVH 7. Therapy recs for SNF 8. Lumbar MRI revealed mod-mod severe disease primarily at L4-5 with mod-mod severe foraminal narrowing. Remote L1 fracture noted 9. Neurology recommendations for continued PT/OT 10. Pending SNF placement 11. LLE gradually increasing in strength 12. [place] d/c soon 2. Afib 1. Remains rate controlled, stable 2. On coreg per home regimen, will cont 3. Stage 3CKD 1. Appears stable 4. Chronic systolic chf 1. Euvolemic, no signs of vol overload 5. DVT prophylaxis 1. Therapeutic coumadin 2. Pharmacy dosing  Code Status: DNR Family Communication: Pt in room, discussed with pt's daughter at bedside on 5/15 Disposition Plan: Pending SNF placement soon   Consultants:  Neurology  Procedures:    Antibiotics:     HPI/Subjective: No complaints. Eager to go to rehab  Objective: Filed Vitals:   02/23/14 1345 02/23/14 1822 02/23/14 2025 02/24/14 0538  BP: 102/52  106/55 117/65  Pulse: 58 62 58 52  Temp: 97.8 F (36.6 C)  97.6 F (36.4 C) 98.6 F (37 C)  TempSrc: Oral  Oral Oral  Resp: 18  18 18   Height:      Weight:      SpO2: 98%  100% 99%    Intake/Output Summary (Last 24 hours) at 02/24/14 0836 Last data filed at 02/24/14 0540  Gross per 24 hour  Intake    360 ml  Output   2350 ml  Net  -1990 ml   Filed Weights   02/17/14 2231  Weight: 72.576 kg (160 lb)    Exam:   General:  Awake, in nad  Cardiovascular: bradycardic, s1, s2  Respiratory: normal resp effort, no wheezing or crackles  Abdomen: soft,  nondistended  Musculoskeletal: perfused, no clubbing, no edema  Data Reviewed: Basic Metabolic Panel:  Recent Labs Lab 02/17/14 2344 02/17/14 2355 02/22/14 0655  NA 141 142 138  K 3.9 3.7 4.4  CL 105 105 108  CO2 24  --  22  GLUCOSE 97 100* 96  BUN 12 11 29*  CREATININE 1.30 1.50* 1.34  CALCIUM 9.0  --  8.1*   Liver Function Tests:  Recent Labs Lab 02/17/14 2344  AST 23  ALT 10  ALKPHOS 81  BILITOT 0.4  PROT 7.6  ALBUMIN 3.2*   No results found for this basename: LIPASE, AMYLASE,  in the last 168 hours No results found for this basename: AMMONIA,  in the last 168 hours CBC:  Recent Labs Lab 02/17/14 2344 02/17/14 2355 02/22/14 0655  WBC 5.9  --  4.9  NEUTROABS 4.8  --   --   HGB 13.2 15.0 11.4*  HCT 42.0 44.0 35.4*  MCV 86.6  --  84.7  PLT 158  --  175   Cardiac Enzymes: No results found for this basename: CKTOTAL, CKMB, CKMBINDEX, TROPONINI,  in the last 168 hours BNP (last 3 results) No results found for this basename: PROBNP,  in the last 8760 hours CBG:  Recent Labs Lab 02/17/14 2228  GLUCAP 124*    No results found for this or any previous visit (from the past 240 hour(s)).   Studies: No results found.  Scheduled Meds: .  carvedilol  3.125 mg Oral BID WC  . Difluprednate  1 drop Right Eye BID  . doxycycline  100 mg Oral BID  . gabapentin  100-300 mg Oral QHS  . gatifloxacin  1 drop Right Eye QID  . ketorolac  1 drop Right Eye QID  . levothyroxine  88 mcg Oral QAC breakfast  . warfarin  2.5 mg Oral q1800  . Warfarin - Pharmacist Dosing Inpatient   Does not apply q1800   Continuous Infusions:    Principal Problem:   Acute CVA (cerebrovascular accident) Active Problems:   AF (atrial fibrillation)   Weakness of left leg   Warfarin anticoagulation   Chronic kidney disease, stage III (moderate)   PAD (peripheral artery disease)   Systolic CHF  Time spent: 29min  Stephen K Chiu  [facility] Pager [phone]. If 7PM-7AM,  please contact night-coverage at [email or web address], password [facility] 02/24/2014, 8:36 AM  LOS: 7 days

## 2014-02-24 NOTE — Progress Notes (Addendum)
Update: CSW was notified that patient does have Medicare Benefits. [facility] states they can take patient today. CSW notified patient, patient's daughter and MD. CSW will arrange for transportation for 3pm. CSW signing off at this time.  CSW spoke to [employer] and was told that [place] will not give authorization at this time because patient has Medicare as primary insurance. CSW spoke to patient's wife to receive Medicare Card number. CSW provided number to [employer] to run insurance and check if patient has a skilled benefit. CSW discussed possibility of finding a SNF that will take a patient with an LOG and may have to search outside of county. Patient's daughter stated at that point she would rather take him home with [place]. CSW awaiting phone call from SNF.  Jeanette Caprice, MSW, [place]

## 2014-04-01 ENCOUNTER — Other Ambulatory Visit: Payer: Self-pay | Admitting: Family Medicine

## 2014-04-01 DIAGNOSIS — I4891 Unspecified atrial fibrillation: Secondary | ICD-10-CM

## 2014-04-06 ENCOUNTER — Ambulatory Visit (INDEPENDENT_AMBULATORY_CARE_PROVIDER_SITE_OTHER): Payer: Federal, State, Local not specified - PPO | Admitting: Family Medicine

## 2014-04-06 ENCOUNTER — Telehealth: Payer: Self-pay | Admitting: Family Medicine

## 2014-04-06 ENCOUNTER — Other Ambulatory Visit: Payer: Self-pay | Admitting: Family Medicine

## 2014-04-06 DIAGNOSIS — I4891 Unspecified atrial fibrillation: Secondary | ICD-10-CM

## 2014-04-06 DIAGNOSIS — Z5181 Encounter for therapeutic drug level monitoring: Secondary | ICD-10-CM

## 2014-04-06 LAB — POCT INR: INR: 5.8

## 2014-04-06 MED ORDER — WARFARIN SODIUM 5 MG PO TABS: 2.5000 mg | ORAL_TABLET | Freq: Every day | ORAL | Status: DC

## 2014-04-06 NOTE — Telephone Encounter (Signed)
Wes: [phone] PT needs verbal orders from Dr. Damita Dunnings for this patient.  Please call Wes at the number above.

## 2014-04-06 NOTE — Telephone Encounter (Signed)
Left detailed message on voicemail.  

## 2014-04-06 NOTE — Telephone Encounter (Signed)
Okay to continue.  Please call them.  Thanks .

## 2014-04-06 NOTE — Telephone Encounter (Signed)
See above, have them check INR at his home and notify [facility] for dosing.  Thanks.

## 2014-04-06 NOTE — Telephone Encounter (Signed)
I checked him today.  The nursing home had changed his dose from taking 2.5mg  daily to 5mg  daily and his INR was 5.8. I'm ok for them to check him at home since it's probably easier for them since he's in a wheelchair, as long as they call the results in to me so that I can dose him.

## 2014-04-06 NOTE — Telephone Encounter (Signed)
Blanch Media from [facility] called requesting a verbal order for skilled nursing for patient. Blanch Media stated that she would like to go in once a week for 4 weeks to do education and disease management. Blanch Media stated that patient is on coumadin and she wants to know if they need to do follow the patient's INR or will that be done in the office? Please advise.

## 2014-04-06 NOTE — Telephone Encounter (Signed)
I would like Blair's input on this.

## 2014-04-07 NOTE — Telephone Encounter (Signed)
Spoke with [name] (daughter) who says they are not going to be checking his INR's at home.  They have no way of doing it.  I asked if [facility] would be doing it and she said "No, they will be doing PT only".  Daughter says he will be coming back in here next Monday as instructed and will be managed through our clinic.

## 2014-04-07 NOTE — Telephone Encounter (Signed)
Thanks

## 2014-04-13 ENCOUNTER — Ambulatory Visit (INDEPENDENT_AMBULATORY_CARE_PROVIDER_SITE_OTHER): Payer: Federal, State, Local not specified - PPO | Admitting: Family Medicine

## 2014-04-13 DIAGNOSIS — Z5181 Encounter for therapeutic drug level monitoring: Secondary | ICD-10-CM

## 2014-04-13 DIAGNOSIS — I4891 Unspecified atrial fibrillation: Secondary | ICD-10-CM

## 2014-04-13 LAB — POCT INR: INR: 1.7

## 2014-04-27 ENCOUNTER — Ambulatory Visit (INDEPENDENT_AMBULATORY_CARE_PROVIDER_SITE_OTHER): Payer: MEDICARE | Admitting: Family Medicine

## 2014-04-27 ENCOUNTER — Encounter: Payer: Self-pay | Admitting: Family Medicine

## 2014-04-27 VITALS — BP 130/70 | HR 58 | Temp 98.3°F | Wt 159.5 lb

## 2014-04-27 DIAGNOSIS — E039 Hypothyroidism, unspecified: Secondary | ICD-10-CM

## 2014-04-27 DIAGNOSIS — I6350 Cerebral infarction due to unspecified occlusion or stenosis of unspecified cerebral artery: Secondary | ICD-10-CM

## 2014-04-27 DIAGNOSIS — Q846 Other congenital malformations of nails: Secondary | ICD-10-CM

## 2014-04-27 DIAGNOSIS — L602 Onychogryphosis: Secondary | ICD-10-CM

## 2014-04-27 DIAGNOSIS — L608 Other nail disorders: Secondary | ICD-10-CM

## 2014-04-27 DIAGNOSIS — R29898 Other symptoms and signs involving the musculoskeletal system: Secondary | ICD-10-CM

## 2014-04-27 LAB — TSH: TSH: 1.4 u[IU]/mL (ref 0.35–4.50)

## 2014-04-27 MED ORDER — LEVOTHYROXINE SODIUM 88 MCG PO TABS: 88.0000 ug | ORAL_TABLET | Freq: Every day | ORAL | Status: DC

## 2014-04-27 MED ORDER — CARVEDILOL 3.125 MG PO TABS: 3.1250 mg | ORAL_TABLET | Freq: Two times a day (BID) | ORAL | Status: DC

## 2014-04-27 MED ORDER — GABAPENTIN 100 MG PO CAPS: 100.0000 mg | ORAL_CAPSULE | Freq: Every day | ORAL | Status: DC

## 2014-04-27 NOTE — Patient Instructions (Addendum)
Mitchell Farrell will call about your referral. Don't change your meds.   Go to the lab on the way out.  We'll contact you with your lab report. Take care.  Glad to see you.

## 2014-04-27 NOTE — Progress Notes (Signed)
Pre visit review using our clinic review tool, if applicable. No additional management support is needed unless otherwise documented below in the visit note.  Admitted with presumed CVA with more L leg weakness. It spontaneously resolved, back to baseline (but not back to normal strength).  Hospital records reviewed.  To rehab, now at home. Still with PT at home.  Still on coumadin w/o bleeding.  Swallowing well, no new neuro sx.  Compliant with meds.    Foot changes- L 1st/2nd toe overlap. Nails need trimming. D/w pt. No skin breakdown.   Hypothyroidism. Compliant with meds. Due for labs. No neck mass.    PMH and SH reviewed  ROS: See HPI, otherwise noncontributory.  Meds, vitals, and allergies reviewed.   nad ncat Mmm Neck supple, no LA, no TMG IRR, not tachy Abd soft Trace BLE edema L 1st/2nd toe overlap, no skin breakdown.

## 2014-04-28 ENCOUNTER — Encounter: Payer: Self-pay | Admitting: Family Medicine

## 2014-04-28 NOTE — Assessment & Plan Note (Signed)
Controlled, see notes on labs.

## 2014-04-28 NOTE — Assessment & Plan Note (Signed)
Presumed h/o CVA with AF, at baseline now. Continue PT and coumadin.  He agrees. Hospital records reviewed. Refer to podiatry given the nail changes. >25 minutes spent in face to face time with patient, >50% spent in counselling or coordination of care.

## 2014-05-15 ENCOUNTER — Encounter: Payer: Self-pay | Admitting: Podiatry

## 2014-05-15 ENCOUNTER — Ambulatory Visit (INDEPENDENT_AMBULATORY_CARE_PROVIDER_SITE_OTHER): Payer: Federal, State, Local not specified - PPO | Admitting: Podiatry

## 2014-05-15 VITALS — BP 129/68 | HR 77 | Resp 16 | Ht 66.0 in | Wt 154.0 lb

## 2014-05-15 DIAGNOSIS — M79673 Pain in unspecified foot: Secondary | ICD-10-CM

## 2014-05-15 DIAGNOSIS — M79609 Pain in unspecified limb: Secondary | ICD-10-CM

## 2014-05-15 DIAGNOSIS — M2010 Hallux valgus (acquired), unspecified foot: Secondary | ICD-10-CM

## 2014-05-15 DIAGNOSIS — B351 Tinea unguium: Secondary | ICD-10-CM

## 2014-05-15 NOTE — Progress Notes (Signed)
   Subjective:    Patient ID: Mitchell Quan., male    DOB: 04-Jun-1919, 80 y.o.   MRN: [id number]  HPI Comments: i need my toenails trimmed and my big toe and my 2nd toe on my left foot burn. Its been off and on for 6 months. They are the same. They swell up. Mitchell Farrell been taking tylenol for my feet. i seen dr cline and he trimmed my toenails.      Review of Systems  HENT: Positive for hearing loss.   Musculoskeletal:       Difficulty walking   All other systems reviewed and are negative.      Objective:   Physical Exam        Assessment & Plan:

## 2014-05-15 NOTE — Progress Notes (Signed)
Subjective:     Patient ID: Mitchell Quan., male   DOB: 10/10/1918, 80 y.o.   MRN: [id number]  HPI patient presents with family members stating that they can not cut his toenails and they become painful and he also does have structural malalignment   Review of Systems  All other systems reviewed and are negative.      Objective:   Physical Exam  Nursing note and vitals reviewed. Constitutional: He is oriented to person, place, and time.  Cardiovascular: Intact distal pulses.   Musculoskeletal: Normal range of motion.  Neurological: He is oriented to person, place, and time.  Skin: Skin is warm.   neurovascular status found to be intact with nail disease 1-5 of both feet with thickness and pain and deformity of both feet with structural bunions and hallux underneath the second toe left over right with pain patient's found to have diminished muscle strength and range of motion of the subtalar midtarsal joint and equinus condition is noted. 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 Today deep debridement of nailbeds was accomplished with no iatrogenic bleeding and this will be done as needed and he'll be seen back for deformity as needed. Advised on soft leather-type shoes

## 2014-05-25 ENCOUNTER — Ambulatory Visit (INDEPENDENT_AMBULATORY_CARE_PROVIDER_SITE_OTHER): Payer: MEDICARE | Admitting: Family Medicine

## 2014-05-25 DIAGNOSIS — I4891 Unspecified atrial fibrillation: Secondary | ICD-10-CM

## 2014-05-25 DIAGNOSIS — Z5181 Encounter for therapeutic drug level monitoring: Secondary | ICD-10-CM

## 2014-05-25 LAB — POCT INR: INR: 3

## 2014-07-06 ENCOUNTER — Ambulatory Visit (INDEPENDENT_AMBULATORY_CARE_PROVIDER_SITE_OTHER): Payer: MEDICARE | Admitting: *Deleted

## 2014-07-06 DIAGNOSIS — Z23 Encounter for immunization: Secondary | ICD-10-CM

## 2014-07-06 DIAGNOSIS — I4891 Unspecified atrial fibrillation: Secondary | ICD-10-CM

## 2014-07-06 DIAGNOSIS — Z5181 Encounter for therapeutic drug level monitoring: Secondary | ICD-10-CM | POA: Diagnosis not present

## 2014-07-06 LAB — POCT INR: INR: 1.8

## 2014-07-22 ENCOUNTER — Other Ambulatory Visit: Payer: Self-pay | Admitting: Family Medicine

## 2014-08-03 ENCOUNTER — Ambulatory Visit (INDEPENDENT_AMBULATORY_CARE_PROVIDER_SITE_OTHER): Payer: MEDICARE | Admitting: *Deleted

## 2014-08-03 DIAGNOSIS — I4891 Unspecified atrial fibrillation: Secondary | ICD-10-CM

## 2014-08-03 DIAGNOSIS — Z5181 Encounter for therapeutic drug level monitoring: Secondary | ICD-10-CM

## 2014-08-03 LAB — POCT INR: INR: 2

## 2014-08-07 ENCOUNTER — Other Ambulatory Visit: Payer: Federal, State, Local not specified - PPO

## 2014-08-14 ENCOUNTER — Other Ambulatory Visit: Payer: Federal, State, Local not specified - PPO

## 2014-08-31 ENCOUNTER — Other Ambulatory Visit (INDEPENDENT_AMBULATORY_CARE_PROVIDER_SITE_OTHER): Payer: MEDICARE

## 2014-08-31 DIAGNOSIS — I4891 Unspecified atrial fibrillation: Secondary | ICD-10-CM

## 2014-08-31 DIAGNOSIS — Z5181 Encounter for therapeutic drug level monitoring: Secondary | ICD-10-CM

## 2014-08-31 LAB — PROTIME-INR
INR: 1.4 ratio — ABNORMAL HIGH (ref 0.8–1.0)
Prothrombin Time: 15.9 s — ABNORMAL HIGH (ref 9.6–13.1)

## 2014-09-11 ENCOUNTER — Ambulatory Visit (INDEPENDENT_AMBULATORY_CARE_PROVIDER_SITE_OTHER): Payer: Federal, State, Local not specified - PPO | Admitting: Podiatry

## 2014-09-11 DIAGNOSIS — B351 Tinea unguium: Secondary | ICD-10-CM

## 2014-09-11 DIAGNOSIS — M79673 Pain in unspecified foot: Secondary | ICD-10-CM

## 2014-09-11 DIAGNOSIS — L84 Corns and callosities: Secondary | ICD-10-CM

## 2014-09-11 NOTE — Progress Notes (Signed)
Subjective:     Patient ID: [name], male   DOB: 30-May-1919, 80 y.o.   MRN: [id number]  HPI patient presents with family members stating that they can not cut his toenails and they become painful and he also does have structural malalignment   Review of Systems  All other systems reviewed and are negative.      Objective:   Physical Exam  Nursing note and vitals reviewed. Constitutional: He is oriented to person, place, and time.  Cardiovascular: Intact distal pulses.   Musculoskeletal: Normal range of motion.  Neurological: He is oriented to person, place, and time.  Skin: Skin is warm.   neurovascular status found to be intact with nail disease 1-5 of both feet with thickness and pain and deformity of both feet with structural bunions and hallux underneath the second toe left over right with pain patient's found to have diminished muscle strength and range of motion of the subtalar midtarsal joint and equinus condition is noted. 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 Today deep debridement of nailbeds was accomplished with no iatrogenic bleeding and this will be done as needed and he'll be seen back for deformity as needed. Advised on soft leather-type shoes

## 2014-09-13 NOTE — Progress Notes (Signed)
Subjective:  °  ° Patient ID: Mitchell Dunkel Jr., male   DOB: 04/19/1919, 80 y.o.   MRN: [id number] ° °HPI patient presents with elongated incurvated nailbeds 1-5 both feet that are painful and it's impossible for them to cut due to physical impairment ° ° °Review of Systems ° °   °Objective:  ° Physical Exam °Neurovascular status intact with thick yellow brittle nailbeds 1-5 both feet that are painful °   °Assessment:  °   °Mycotic nail infections with pain 1-5 both feet °   °Plan:  °   °Debride painful nailbeds 1-5 both feet with no iatrogenic bleeding noted °   ° ° °

## 2014-09-28 ENCOUNTER — Ambulatory Visit (INDEPENDENT_AMBULATORY_CARE_PROVIDER_SITE_OTHER): Payer: MEDICARE | Admitting: Family Medicine

## 2014-09-28 DIAGNOSIS — Z5181 Encounter for therapeutic drug level monitoring: Secondary | ICD-10-CM

## 2014-09-28 LAB — POCT INR: INR: 2

## 2014-10-26 ENCOUNTER — Ambulatory Visit: Payer: MEDICARE

## 2014-10-26 ENCOUNTER — Other Ambulatory Visit (INDEPENDENT_AMBULATORY_CARE_PROVIDER_SITE_OTHER): Payer: MEDICARE

## 2014-10-26 DIAGNOSIS — I4891 Unspecified atrial fibrillation: Secondary | ICD-10-CM

## 2014-10-26 LAB — POCT INR: INR: 3

## 2014-11-30 ENCOUNTER — Other Ambulatory Visit (INDEPENDENT_AMBULATORY_CARE_PROVIDER_SITE_OTHER): Payer: MEDICARE

## 2014-11-30 DIAGNOSIS — I4891 Unspecified atrial fibrillation: Secondary | ICD-10-CM

## 2014-11-30 LAB — POCT INR: INR: 2

## 2014-12-07 ENCOUNTER — Other Ambulatory Visit: Payer: MEDICARE

## 2014-12-11 ENCOUNTER — Ambulatory Visit (INDEPENDENT_AMBULATORY_CARE_PROVIDER_SITE_OTHER): Payer: Federal, State, Local not specified - PPO | Admitting: Podiatry

## 2014-12-11 DIAGNOSIS — B351 Tinea unguium: Secondary | ICD-10-CM | POA: Diagnosis not present

## 2014-12-11 DIAGNOSIS — M79673 Pain in unspecified foot: Secondary | ICD-10-CM | POA: Diagnosis not present

## 2014-12-11 NOTE — Progress Notes (Signed)
Subjective:     Patient ID: Mitchell Quan., male   DOB: Sep 07, 1919, 81 y.o.   MRN: [id number]  HPI patient presents with elongated incurvated nailbeds 1-5 both feet that are painful and it's impossible for them to cut due to physical impairment   Review of Systems     Objective:   Physical Exam Neurovascular status intact with thick yellow brittle nailbeds 1-5 both feet that are painful    Assessment:     Mycotic nail infections with pain 1-5 both feet    Plan:     Debride painful nailbeds 1-5 both feet with no iatrogenic bleeding noted

## 2015-01-11 ENCOUNTER — Other Ambulatory Visit (INDEPENDENT_AMBULATORY_CARE_PROVIDER_SITE_OTHER): Payer: MEDICARE

## 2015-01-11 DIAGNOSIS — I4891 Unspecified atrial fibrillation: Secondary | ICD-10-CM

## 2015-01-11 LAB — POCT INR: INR: 3.2

## 2015-02-08 ENCOUNTER — Other Ambulatory Visit (INDEPENDENT_AMBULATORY_CARE_PROVIDER_SITE_OTHER): Payer: MEDICARE

## 2015-02-08 DIAGNOSIS — I4891 Unspecified atrial fibrillation: Secondary | ICD-10-CM

## 2015-02-08 LAB — POCT INR: INR: 3.9

## 2015-02-09 ENCOUNTER — Encounter: Payer: Self-pay | Admitting: Family Medicine

## 2015-02-09 ENCOUNTER — Ambulatory Visit (INDEPENDENT_AMBULATORY_CARE_PROVIDER_SITE_OTHER): Payer: MEDICARE | Admitting: Family Medicine

## 2015-02-09 VITALS — BP 142/60 | HR 61 | Temp 97.5°F | Wt 160.5 lb

## 2015-02-09 DIAGNOSIS — Z7901 Long term (current) use of anticoagulants: Secondary | ICD-10-CM

## 2015-02-09 DIAGNOSIS — M79672 Pain in left foot: Secondary | ICD-10-CM

## 2015-02-09 DIAGNOSIS — K209 Esophagitis, unspecified without bleeding: Secondary | ICD-10-CM

## 2015-02-09 DIAGNOSIS — R29898 Other symptoms and signs involving the musculoskeletal system: Secondary | ICD-10-CM | POA: Diagnosis not present

## 2015-02-09 DIAGNOSIS — D649 Anemia, unspecified: Secondary | ICD-10-CM | POA: Diagnosis not present

## 2015-02-09 DIAGNOSIS — I4891 Unspecified atrial fibrillation: Secondary | ICD-10-CM

## 2015-02-09 DIAGNOSIS — E039 Hypothyroidism, unspecified: Secondary | ICD-10-CM

## 2015-02-09 LAB — CBC WITH DIFFERENTIAL/PLATELET
BASOS PCT: 0.8 % (ref 0.0–3.0)
Basophils Absolute: 0.1 10*3/uL (ref 0.0–0.1)
Eosinophils Absolute: 0.1 10*3/uL (ref 0.0–0.7)
Eosinophils Relative: 2.1 % (ref 0.0–5.0)
HCT: 40.4 % (ref 39.0–52.0)
HEMOGLOBIN: 13.4 g/dL (ref 13.0–17.0)
LYMPHS ABS: 1.8 10*3/uL (ref 0.7–4.0)
Lymphocytes Relative: 29.5 % (ref 12.0–46.0)
MCHC: 33.1 g/dL (ref 30.0–36.0)
MCV: 82.7 fl (ref 78.0–100.0)
MONO ABS: 0.5 10*3/uL (ref 0.1–1.0)
Monocytes Relative: 8.2 % (ref 3.0–12.0)
NEUTROS ABS: 3.7 10*3/uL (ref 1.4–7.7)
Neutrophils Relative %: 59.4 % (ref 43.0–77.0)
Platelets: 189 10*3/uL (ref 150.0–400.0)
RBC: 4.88 Mil/uL (ref 4.22–5.81)
RDW: 15.8 % — ABNORMAL HIGH (ref 11.5–15.5)
WBC: 6.2 10*3/uL (ref 4.0–10.5)

## 2015-02-09 LAB — COMPREHENSIVE METABOLIC PANEL
ALBUMIN: 3.3 g/dL — AB (ref 3.5–5.2)
ALT: 11 U/L (ref 0–53)
AST: 21 U/L (ref 0–37)
Alkaline Phosphatase: 69 U/L (ref 39–117)
BUN: 19 mg/dL (ref 6–23)
CHLORIDE: 105 meq/L (ref 96–112)
CO2: 26 meq/L (ref 19–32)
CREATININE: 1.56 mg/dL — AB (ref 0.40–1.50)
Calcium: 9.2 mg/dL (ref 8.4–10.5)
GFR: 53.62 mL/min — ABNORMAL LOW (ref >60.00)
Glucose, Bld: 96 mg/dL (ref 70–99)
POTASSIUM: 4.4 meq/L (ref 3.5–5.1)
Sodium: 136 mEq/L (ref 135–145)
Total Bilirubin: 0.6 mg/dL (ref 0.2–1.2)
Total Protein: 7.4 g/dL (ref 6.0–8.3)

## 2015-02-09 LAB — IBC PANEL
Iron: 56 ug/dL (ref 42–165)
SATURATION RATIOS: 22.5 % (ref 20.0–50.0)
TRANSFERRIN: 178 mg/dL — AB (ref 212.0–360.0)

## 2015-02-09 LAB — TSH: TSH: 0.4 u[IU]/mL (ref 0.35–4.50)

## 2015-02-09 MED ORDER — WARFARIN SODIUM 5 MG PO TABS: 2.5000 mg | ORAL_TABLET | Freq: Every day | ORAL | Status: DC

## 2015-02-09 MED ORDER — GABAPENTIN 100 MG PO CAPS: 100.0000 mg | ORAL_CAPSULE | Freq: Every day | ORAL | Status: DC

## 2015-02-09 MED ORDER — LEVOTHYROXINE SODIUM 88 MCG PO TABS: 88.0000 ug | ORAL_TABLET | Freq: Every day | ORAL | Status: DC

## 2015-02-09 MED ORDER — OMEPRAZOLE MAGNESIUM 20 MG PO TBEC: 20.0000 mg | DELAYED_RELEASE_TABLET | Freq: Every day | ORAL | Status: DC

## 2015-02-09 MED ORDER — CARVEDILOL 3.125 MG PO TABS: 3.1250 mg | ORAL_TABLET | Freq: Two times a day (BID) | ORAL | Status: DC

## 2015-02-09 NOTE — Progress Notes (Signed)
Pre visit review using our clinic review tool, if applicable. No additional management support is needed unless otherwise documented below in the visit note.  L leg pain.  Long standing.  Had been some better on gabapentin prev.  Still on med. Pain at night, not during the day.  Not every night with pain.  Burning pain.  No trauma.  No R sided pain.    Still with deconditioning, likely from prev presumed CVA.  L leg weakness at baseline.  Had [facility] prev, would like to restart. Some fatigue noted.    GERD, not controlled with zantac and tums.  Asking about options.    Hypothyroid.  No ADE on med.  Due for labs.  No neck mass. No dysphagia.    Still on coumadin.  See dosing instruction below.  No bleeding.    PMH and SH reviewed  ROS: See HPI, otherwise noncontributory.  Meds, vitals, and allergies reviewed.   GEN: nad, alert and oriented HEENT: mucous membranes moist NECK: supple w/o LA, no tmg CV: IRR, not tachy PULM: ctab, no inc wob ABD: soft, +bs, normal BS EXT: no edema SKIN: no acute rash but macerated tissue between L 1st and 2nd toes, no ulceration (he sleeps in socks and that may contribute) L foot with normal inspection o/w, normal DP pulse but weak dorsi and plantarflexion.

## 2015-02-09 NOTE — Patient Instructions (Signed)
Go to the lab on the way out.  We'll contact you with your lab report. Mitchell Farrell will call about your referral. Stop the zantac and tums.  Change to prilosec.  See if it is cheaper over the counter.  Take care.  Glad to see you.

## 2015-02-10 MED ORDER — WARFARIN SODIUM 5 MG PO TABS: 2.5000 mg | ORAL_TABLET | Freq: Every day | ORAL | Status: DC

## 2015-02-10 NOTE — Assessment & Plan Note (Signed)
Will ask for restart HH PT. No new sx.

## 2015-02-10 NOTE — Assessment & Plan Note (Signed)
Likely neuropathic, inc gabapentin to 200mg  qhs.  D/w pt.  He agrees.  Foot care d/w pt and grandson.

## 2015-02-10 NOTE — Assessment & Plan Note (Signed)
H/o, with more GERD sx and indigestion recently.  Stop zantac and tums, change to PPI.  D/w pt.  He agrees.

## 2015-02-10 NOTE — Assessment & Plan Note (Signed)
Continue as is for now, see notes on labs.

## 2015-02-10 NOTE — Assessment & Plan Note (Signed)
Continue BB and warfarin for now.   See notes on labs.

## 2015-02-10 NOTE — Assessment & Plan Note (Signed)
I initially gave the old instructions to patient re: coumadin dosing.  I update this and call his family back after the OV to clarify.  This note is now correct.  I apologized to his daughter.  She was gracious.

## 2015-02-11 ENCOUNTER — Telehealth: Payer: Self-pay

## 2015-02-11 NOTE — Telephone Encounter (Signed)
Erin Rippy PT with home health left v/m; Erin request rx for AFO; lt ankle / foot orthosis, to aid pt in walking and reduce risk of falling. Erin request pts family to pick up rx.Please advise.

## 2015-02-12 NOTE — Telephone Encounter (Signed)
rx done. Thanks.

## 2015-02-12 NOTE — Telephone Encounter (Signed)
Daughter advised.  Rx left at front desk for pick up.  

## 2015-02-22 ENCOUNTER — Telehealth: Payer: Self-pay | Admitting: *Deleted

## 2015-02-22 NOTE — Telephone Encounter (Signed)
Erin at [facility].

## 2015-02-22 NOTE — Telephone Encounter (Signed)
Mitchell Farrell for the [facility] is requesting a verbal order for Occupational Therapy evaluation to work on his upper body strength.  Please advise.

## 2015-02-22 NOTE — Telephone Encounter (Signed)
Please give the order.  Thanks.   

## 2015-03-01 ENCOUNTER — Other Ambulatory Visit (INDEPENDENT_AMBULATORY_CARE_PROVIDER_SITE_OTHER): Payer: MEDICARE

## 2015-03-01 DIAGNOSIS — I4891 Unspecified atrial fibrillation: Secondary | ICD-10-CM | POA: Diagnosis not present

## 2015-03-01 LAB — POCT INR: INR: 3

## 2015-03-12 ENCOUNTER — Ambulatory Visit: Payer: Federal, State, Local not specified - PPO

## 2015-03-16 ENCOUNTER — Ambulatory Visit: Payer: Federal, State, Local not specified - PPO

## 2015-03-16 NOTE — Telephone Encounter (Signed)
Form faxed from [facility] for detailed written order and letter of medical necessity for patient's AFO for left foot and ankle. Form is placed in Dr. Josefine Class In Box along with a copy of the last office note as requested for signature, date and faxing.

## 2015-03-17 NOTE — Telephone Encounter (Signed)
I'll work on the hard copy when I'm back in town.

## 2015-03-29 ENCOUNTER — Telehealth: Payer: Self-pay

## 2015-03-29 NOTE — Telephone Encounter (Signed)
Please give the order.  Thanks.   

## 2015-03-29 NOTE — Telephone Encounter (Signed)
Erin advised.

## 2015-03-29 NOTE — Telephone Encounter (Signed)
Erin PT with [facility] left v/m requesting verbal order to extend home health PT for one week to continue working on standing,walking and strength.Please advise.

## 2015-03-30 ENCOUNTER — Ambulatory Visit (INDEPENDENT_AMBULATORY_CARE_PROVIDER_SITE_OTHER): Payer: MEDICARE | Admitting: Podiatry

## 2015-03-30 DIAGNOSIS — B351 Tinea unguium: Secondary | ICD-10-CM | POA: Diagnosis not present

## 2015-03-30 DIAGNOSIS — M79673 Pain in unspecified foot: Secondary | ICD-10-CM | POA: Diagnosis not present

## 2015-03-30 NOTE — Progress Notes (Signed)
Subjective: 81 y.o. male returns the office today for painful, elongated, thickened toenails which he is unable to trim himself. Denies any redness or drainage around the nails. Denies any acute changes since last appointment and no new complaints today. Denies any systemic complaints such as fevers, chills, nausea, vomiting.   Objective: AAO 3, NAD DP/PT pulses palpable, CRT less than 3 seconds Nails hypertrophic, dystrophic, elongated, brittle, discolored 10. There is tenderness overlying the nails 1-5 bilaterally. There is no surrounding erythema or drainage along the nail sites. No open lesions or pre-ulcerative lesions are identified. HAV presents with the hallux and 2nd digit overlapping.  No other areas of tenderness bilateral lower extremities. No overlying edema, erythema, increased warmth. No pain with calf compression, swelling, warmth, erythema.  Assessment: Patient presents with symptomatic onychomycosis  Plan: -Treatment options including alternatives, risks, complications were discussed -Nails sharply debrided 10 without complication/bleeding. -Dispensed silicone toe separators to help protect the hallux and second toes. -Discussed daily foot inspection. If there are any changes, to call the office immediately.  -Follow-up in 3 months or sooner if any problems are to arise. In the meantime, encouraged to call the office with any questions, concerns, changes symptoms.  Celesta Gentile, DPM

## 2015-04-01 ENCOUNTER — Encounter: Payer: Self-pay | Admitting: Family Medicine

## 2015-04-01 ENCOUNTER — Other Ambulatory Visit: Payer: MEDICARE

## 2015-04-01 ENCOUNTER — Ambulatory Visit (INDEPENDENT_AMBULATORY_CARE_PROVIDER_SITE_OTHER): Payer: MEDICARE | Admitting: Family Medicine

## 2015-04-01 ENCOUNTER — Ambulatory Visit (INDEPENDENT_AMBULATORY_CARE_PROVIDER_SITE_OTHER): Payer: MEDICARE | Admitting: *Deleted

## 2015-04-01 VITALS — BP 112/80 | HR 48 | Temp 98.4°F

## 2015-04-01 DIAGNOSIS — Z5181 Encounter for therapeutic drug level monitoring: Secondary | ICD-10-CM | POA: Diagnosis not present

## 2015-04-01 DIAGNOSIS — I4891 Unspecified atrial fibrillation: Secondary | ICD-10-CM

## 2015-04-01 DIAGNOSIS — R609 Edema, unspecified: Secondary | ICD-10-CM

## 2015-04-01 LAB — POCT INR: INR: 3

## 2015-04-01 MED ORDER — FUROSEMIDE 20 MG PO TABS: 20.0000 mg | ORAL_TABLET | Freq: Every day | ORAL | Status: DC | PRN

## 2015-04-01 NOTE — Patient Instructions (Signed)
Cut out the chips, use lasix as needed but try to limit use.  It's okay to take it today and tomorrow and then see if you can stop it.  Take care.  Glad to see you.

## 2015-04-01 NOTE — Assessment & Plan Note (Signed)
Likely from recent salt load.  Cut out chips and use lasix sparingly.  He agrees.  Update me as needed.  Recent Cr and BP today should be able to tolerate episode low dose of lasix.

## 2015-04-01 NOTE — Progress Notes (Signed)
Pre visit review using our clinic review tool, if applicable. No additional management support is needed unless otherwise documented below in the visit note. 

## 2015-04-01 NOTE — Progress Notes (Signed)
Pre visit review using our clinic review tool, if applicable. No additional management support is needed unless otherwise documented below in the visit note.  In PT and feeling well at baseline w/o pain but has L>R foot swelling.  Family had noted some episodes of heavy breathing- this is not consistent.  It happens sitting up.  He didn't know what was causing it.  Episodes are brief.  No CP.  Has used a stocking on both legs recently.  No FCNAVD.  Normal BMs.  Normal UOP.  Not on any diuretic currently.  [name] been adding salt and eating more potato chips recently.    Meds, vitals, and allergies reviewed.   ROS: See HPI.  Otherwise, noncontributory.  nad In wheelchair Neck supple IRR, not tachy Ctab, no rales abd soft Ext with 1+  BLE edema

## 2015-04-29 ENCOUNTER — Ambulatory Visit (INDEPENDENT_AMBULATORY_CARE_PROVIDER_SITE_OTHER): Payer: MEDICARE | Admitting: *Deleted

## 2015-04-29 DIAGNOSIS — I4891 Unspecified atrial fibrillation: Secondary | ICD-10-CM

## 2015-04-29 DIAGNOSIS — Z5181 Encounter for therapeutic drug level monitoring: Secondary | ICD-10-CM | POA: Diagnosis not present

## 2015-04-29 LAB — POCT INR: INR: 3

## 2015-04-29 NOTE — Progress Notes (Signed)
Pre visit review using our clinic review tool, if applicable. No additional management support is needed unless otherwise documented below in the visit note. 

## 2015-05-31 ENCOUNTER — Ambulatory Visit (INDEPENDENT_AMBULATORY_CARE_PROVIDER_SITE_OTHER): Payer: MEDICARE | Admitting: *Deleted

## 2015-05-31 DIAGNOSIS — I4891 Unspecified atrial fibrillation: Secondary | ICD-10-CM | POA: Diagnosis not present

## 2015-05-31 DIAGNOSIS — Z5181 Encounter for therapeutic drug level monitoring: Secondary | ICD-10-CM

## 2015-05-31 LAB — POCT INR: INR: 2.4

## 2015-05-31 NOTE — Progress Notes (Signed)
Pre visit review using our clinic review tool, if applicable. No additional management support is needed unless otherwise documented below in the visit note. 

## 2015-06-28 ENCOUNTER — Ambulatory Visit (INDEPENDENT_AMBULATORY_CARE_PROVIDER_SITE_OTHER): Payer: MEDICARE | Admitting: *Deleted

## 2015-06-28 VITALS — BP 102/66

## 2015-06-28 DIAGNOSIS — Z23 Encounter for immunization: Secondary | ICD-10-CM | POA: Diagnosis not present

## 2015-06-28 DIAGNOSIS — Z5181 Encounter for therapeutic drug level monitoring: Secondary | ICD-10-CM

## 2015-06-28 DIAGNOSIS — I4891 Unspecified atrial fibrillation: Secondary | ICD-10-CM | POA: Diagnosis not present

## 2015-06-28 LAB — POCT INR: INR: 3.2

## 2015-06-28 NOTE — Progress Notes (Signed)
Pre visit review using our clinic review tool, if applicable. No additional management support is needed unless otherwise documented below in the visit note. 

## 2015-07-05 ENCOUNTER — Ambulatory Visit: Payer: MEDICARE

## 2015-07-19 ENCOUNTER — Ambulatory Visit (INDEPENDENT_AMBULATORY_CARE_PROVIDER_SITE_OTHER): Payer: MEDICARE | Admitting: Podiatry

## 2015-07-19 DIAGNOSIS — B351 Tinea unguium: Secondary | ICD-10-CM | POA: Diagnosis not present

## 2015-07-19 DIAGNOSIS — M79673 Pain in unspecified foot: Secondary | ICD-10-CM

## 2015-07-19 NOTE — Progress Notes (Signed)
Subjective: 81 y.o. male returns the office today for painful, elongated, thickened toenails which he is unable to trim himself. Denies any redness or drainage around the nails. Denies any acute changes since last appointment and no new complaints today. Denies any systemic complaints such as fevers, chills, nausea, vomiting.   Objective: AAO 3, NAD DP/PT pulses palpable, CRT less than 3 seconds Nails hypertrophic, dystrophic, elongated, brittle, discolored 10. There is tenderness overlying the nails 1-5 bilaterally. There is no surrounding erythema or drainage along the nail sites. No open lesions or pre-ulcerative lesions are identified. HAV presents with the hallux and 2nd digit overlapping.  No other areas of tenderness bilateral lower extremities. No overlying edema, erythema, increased warmth. No pain with calf compression, swelling, warmth, erythema.  Assessment: Patient presents with symptomatic onychomycosis  Plan: -Treatment options including alternatives, risks, complications were discussed -Nails sharply debrided 10 without complication/bleeding. -Dispensed silicone toe separators to help protect the hallux and second toes. -Discussed daily foot inspection. If there are any changes, to call the office immediately.  -Follow-up in 3 months or sooner if any problems are to arise. In the meantime, encouraged to call the office with any questions, concerns, changes symptoms. 3 mo.  Roselind Messier DPM

## 2015-07-26 ENCOUNTER — Ambulatory Visit (INDEPENDENT_AMBULATORY_CARE_PROVIDER_SITE_OTHER): Payer: MEDICARE | Admitting: *Deleted

## 2015-07-26 DIAGNOSIS — Z5181 Encounter for therapeutic drug level monitoring: Secondary | ICD-10-CM | POA: Diagnosis not present

## 2015-07-26 DIAGNOSIS — I4891 Unspecified atrial fibrillation: Secondary | ICD-10-CM

## 2015-07-26 LAB — POCT INR: INR: 2.6

## 2015-07-26 NOTE — Progress Notes (Signed)
Pre visit review using our clinic review tool, if applicable. No additional management support is needed unless otherwise documented below in the visit note. 

## 2015-08-23 ENCOUNTER — Ambulatory Visit (INDEPENDENT_AMBULATORY_CARE_PROVIDER_SITE_OTHER): Payer: MEDICARE | Admitting: *Deleted

## 2015-08-23 DIAGNOSIS — Z5181 Encounter for therapeutic drug level monitoring: Secondary | ICD-10-CM

## 2015-08-23 DIAGNOSIS — I4891 Unspecified atrial fibrillation: Secondary | ICD-10-CM | POA: Diagnosis not present

## 2015-08-23 LAB — POCT INR: INR: 3

## 2015-08-23 NOTE — Progress Notes (Signed)
Pre visit review using our clinic review tool, if applicable. No additional management support is needed unless otherwise documented below in the visit note. 

## 2015-08-25 ENCOUNTER — Telehealth: Payer: Self-pay

## 2015-08-25 NOTE — Telephone Encounter (Signed)
Mitchell Farrell (DPR signed) said pt having problem standing up; feels like lt leg gives way; Mitchell Farrell wants to know if home h ealth could come out again for PT and home health aide to assist with bathing. Mitchell Farrell said she cannot bring pt to office. Barbara request cb. Pt last seen acute visit 04/01/15. [place].

## 2015-08-26 NOTE — Telephone Encounter (Signed)
Left detailed message with grandson that transports patient.  [name] not available.

## 2015-08-26 NOTE — Telephone Encounter (Signed)
I can put in the order.  The issue is getting it covered.  I think he'll need OV within 3 months.  He is still coming in for INRs.  Can someone bring him in for OV?

## 2015-09-20 ENCOUNTER — Encounter: Payer: Self-pay | Admitting: Family Medicine

## 2015-09-20 ENCOUNTER — Ambulatory Visit (INDEPENDENT_AMBULATORY_CARE_PROVIDER_SITE_OTHER): Payer: MEDICARE | Admitting: Family Medicine

## 2015-09-20 ENCOUNTER — Ambulatory Visit (INDEPENDENT_AMBULATORY_CARE_PROVIDER_SITE_OTHER): Payer: MEDICARE | Admitting: *Deleted

## 2015-09-20 VITALS — BP 104/64 | HR 72 | Temp 98.8°F | Wt 170.8 lb

## 2015-09-20 DIAGNOSIS — E039 Hypothyroidism, unspecified: Secondary | ICD-10-CM | POA: Diagnosis not present

## 2015-09-20 DIAGNOSIS — I4891 Unspecified atrial fibrillation: Secondary | ICD-10-CM | POA: Diagnosis not present

## 2015-09-20 DIAGNOSIS — Z23 Encounter for immunization: Secondary | ICD-10-CM

## 2015-09-20 DIAGNOSIS — I482 Chronic atrial fibrillation, unspecified: Secondary | ICD-10-CM

## 2015-09-20 DIAGNOSIS — Z5181 Encounter for therapeutic drug level monitoring: Secondary | ICD-10-CM

## 2015-09-20 DIAGNOSIS — R29898 Other symptoms and signs involving the musculoskeletal system: Secondary | ICD-10-CM

## 2015-09-20 LAB — COMPREHENSIVE METABOLIC PANEL
ALT: 9 U/L (ref 0–53)
AST: 17 U/L (ref 0–37)
Albumin: 3.3 g/dL — ABNORMAL LOW (ref 3.5–5.2)
Alkaline Phosphatase: 85 U/L (ref 39–117)
BILIRUBIN TOTAL: 0.5 mg/dL (ref 0.2–1.2)
BUN: 18 mg/dL (ref 6–23)
CALCIUM: 8.9 mg/dL (ref 8.4–10.5)
CHLORIDE: 107 meq/L (ref 96–112)
CO2: 26 meq/L (ref 19–32)
Creatinine, Ser: 1.51 mg/dL — ABNORMAL HIGH (ref 0.40–1.50)
GFR: 55.6 mL/min — AB (ref >60.00)
Glucose, Bld: 98 mg/dL (ref 70–99)
POTASSIUM: 4.6 meq/L (ref 3.5–5.1)
Sodium: 138 mEq/L (ref 135–145)
Total Protein: 7.2 g/dL (ref 6.0–8.3)

## 2015-09-20 LAB — TSH: TSH: 1.3 u[IU]/mL (ref 0.35–4.50)

## 2015-09-20 LAB — POCT INR: INR: 2.4

## 2015-09-20 NOTE — Progress Notes (Signed)
Pre visit review using our clinic review tool, if applicable. No additional management support is needed unless otherwise documented below in the visit note.  Less ROM in the L leg.  Gradually worse in the last month.  Some burning in the foot, not in the leg- foot changes at baseline.  No foot sx in daytime- only at night.  Still taking some gabapentin at night for foot pain with some relief.  No R sided sx.  He hasn't been bearing as much weight on the L leg.  Weight is up.  L leg weakness is not new.  He had been out of PT for a few months.  He has improved with PT prev.  Able to walk some with orthosis and walker, but didn't have either at clinic visit.   Due for f/u TSH.  No dysphagia.  No sx of hypothyroidism.  Weight is up, but likely from excess caloric intake per patient and family.   Meds, vitals, and allergies reviewed.   ROS: See HPI.  Otherwise, noncontributory.  nad In wheelchair Mmm Neck supple No LA IRR, not tachy ctab abd soft Ext w/o edema L quad weak- not a new finding Normal DP pulse in L foot but calloused area on the dorsum on L 2nd toe note- not ulcerated.

## 2015-09-20 NOTE — Patient Instructions (Signed)
See Barnett Applebaum.  Go to the lab on the way out.  We'll contact you with your lab report. Rosaria Ferries will call about your referral. Take care.  Glad to see you.  Try to protect the top of your left foot in the meantime.  Let me know if that spot is getting worse.

## 2015-09-20 NOTE — Progress Notes (Signed)
Pre visit review using our clinic review tool, if applicable. No additional management support is needed unless otherwise documented below in the visit note. 

## 2015-09-22 NOTE — Assessment & Plan Note (Signed)
He is still a fall risk.  Would benefit from PT again as his effective range for safe walking is diminished.  This isn't a new finding that needs new w/u.  He and family agree.  Continue walker and orthosis.

## 2015-09-22 NOTE — Assessment & Plan Note (Signed)
See notes on labs.  >25 minutes spent in face to face time with patient, >50% spent in counselling or coordination of care.  

## 2015-09-23 ENCOUNTER — Encounter: Payer: Self-pay | Admitting: *Deleted

## 2015-10-06 ENCOUNTER — Telehealth: Payer: Self-pay | Admitting: Family Medicine

## 2015-10-06 NOTE — Telephone Encounter (Signed)
Mitchell Farrell w/[facility] needed a verbal authorization for an extension of physical therapy for three more weeks.

## 2015-10-06 NOTE — Telephone Encounter (Signed)
Erin advised. 

## 2015-10-06 NOTE — Telephone Encounter (Signed)
Please give the order.  Thanks.   

## 2015-10-11 DIAGNOSIS — I5020 Unspecified systolic (congestive) heart failure: Secondary | ICD-10-CM | POA: Diagnosis not present

## 2015-10-11 DIAGNOSIS — M1991 Primary osteoarthritis, unspecified site: Secondary | ICD-10-CM

## 2015-10-11 DIAGNOSIS — I482 Chronic atrial fibrillation: Secondary | ICD-10-CM | POA: Diagnosis not present

## 2015-10-18 ENCOUNTER — Ambulatory Visit: Payer: MEDICARE

## 2015-10-18 ENCOUNTER — Ambulatory Visit: Payer: MEDICARE | Admitting: Podiatry

## 2015-10-19 ENCOUNTER — Other Ambulatory Visit: Payer: MEDICARE

## 2015-10-25 ENCOUNTER — Telehealth: Payer: Self-pay | Admitting: *Deleted

## 2015-10-25 ENCOUNTER — Ambulatory Visit (INDEPENDENT_AMBULATORY_CARE_PROVIDER_SITE_OTHER): Payer: MEDICARE | Admitting: *Deleted

## 2015-10-25 DIAGNOSIS — Z5181 Encounter for therapeutic drug level monitoring: Secondary | ICD-10-CM

## 2015-10-25 DIAGNOSIS — I4891 Unspecified atrial fibrillation: Secondary | ICD-10-CM | POA: Diagnosis not present

## 2015-10-25 LAB — POCT INR: INR: 3

## 2015-10-25 NOTE — Telephone Encounter (Signed)
I called and talked with his family.   This isn't an acute issue, ie didn't just start today.  He clearly isn't in the window for emergent treatment.   They'll update me about his sx tomorrow.  I'll check to see about any home care agencies in the meantime.  I'll await the records/imaging from the eye clinic.  Please call them tomorrow about his leg sx, if they don't call back first.   Thanks.

## 2015-10-25 NOTE — Telephone Encounter (Signed)
Mitchell Farrell can be reached at her home number after 4 pm.  [phone]

## 2015-10-25 NOTE — Progress Notes (Signed)
Pre visit review using our clinic review tool, if applicable. No additional management support is needed unless otherwise documented below in the visit note. 

## 2015-10-25 NOTE — Telephone Encounter (Signed)
Please call family about get update on this (leg weakness).  I saw patient in passing this AM at his coumadin check and he didn't mention this.  If going on for a few days and no new sx in the interval, would not need emergent stroke eval.   Also- patient had eval and MRI done by Dr. Katy Fitch at the eye clinic.  I don't see records from that.  Please call eye clinic and see about getting notes/imaging report.    Thanks.

## 2015-10-25 NOTE — Telephone Encounter (Signed)
Jana Half- Nurse with [facility] called. It is her 1st time seeing pt but she notes that per notes from nurses last visit and pt himself that pt is having severe left leg weakness. On 10/19/15 he was able to get up from chair and walk 100 feet unassisted and as of a few days ago he can't stand by himself and he has to pick his left leg up with his hands. He his not displaying any stroke symptoms, and other left sided weakness only his left leg. Please advise? Jana Half advised to call pt and family with any advise/ instructions.

## 2015-10-25 NOTE — Telephone Encounter (Signed)
Spoke with [name] who says he is having this leg weakness for the past 2 days.  It is becoming difficult for the family to bathe and care for him because he is not able to stand or walk. Requested notes and MRI from Dr. Katy Fitch.

## 2015-10-26 NOTE — Telephone Encounter (Signed)
Spoke with [name] (daughter) who says Mr. Debates says he feels better today and is able to stand on his leg and get his clothes on.  Please phone [name] (daughter) at [phone] for any instructions if she is not at the patient's home.

## 2015-10-27 NOTE — Telephone Encounter (Signed)
Noted. Thanks.

## 2015-10-27 NOTE — Telephone Encounter (Signed)
Glad he is doing better.  Please call her/patient.  No new CVA or new findings on the MRI from the eye clinic.  If she needs a list of home aid agencies, then let me know and I'll send her a letter with contact information.  Thanks.

## 2015-10-27 NOTE — Telephone Encounter (Signed)
Patient's daughter [name]) notified as instructed by telephone and verbalized understanding. [name] stated that they are okay for now and will call back if she needs any information for help.

## 2015-11-01 ENCOUNTER — Encounter: Payer: Self-pay | Admitting: Podiatry

## 2015-11-01 ENCOUNTER — Ambulatory Visit (INDEPENDENT_AMBULATORY_CARE_PROVIDER_SITE_OTHER): Payer: Federal, State, Local not specified - PPO | Admitting: Podiatry

## 2015-11-01 DIAGNOSIS — M79676 Pain in unspecified toe(s): Secondary | ICD-10-CM

## 2015-11-01 DIAGNOSIS — Q828 Other specified congenital malformations of skin: Secondary | ICD-10-CM | POA: Diagnosis not present

## 2015-11-01 DIAGNOSIS — B351 Tinea unguium: Secondary | ICD-10-CM | POA: Diagnosis not present

## 2015-11-01 NOTE — Progress Notes (Signed)
He presents today with a chief complaint of painful elongated toenails and corns and calluses.  Objective: Vital signs are stable he is alert and oriented 3. Toenails are thick yellow dystrophic lytic mycotic painful on palpation as are his calluses. Calluses are plantar aspect of the bilateral foot.  Assessment: Pain in limb segment onychomycosis and porokeratosis.  Plan: Debridement of all reactive hyperkeratosis and toenails 1 through 5 bilateral.

## 2015-11-22 ENCOUNTER — Ambulatory Visit: Payer: MEDICARE

## 2015-11-25 ENCOUNTER — Other Ambulatory Visit (INDEPENDENT_AMBULATORY_CARE_PROVIDER_SITE_OTHER): Payer: MEDICARE

## 2015-11-25 DIAGNOSIS — Z5181 Encounter for therapeutic drug level monitoring: Secondary | ICD-10-CM

## 2015-11-25 DIAGNOSIS — I4891 Unspecified atrial fibrillation: Secondary | ICD-10-CM

## 2015-11-25 LAB — POCT INR: INR: 2.5

## 2015-11-26 ENCOUNTER — Ambulatory Visit (INDEPENDENT_AMBULATORY_CARE_PROVIDER_SITE_OTHER): Payer: MEDICARE | Admitting: *Deleted

## 2015-11-26 DIAGNOSIS — Z5181 Encounter for therapeutic drug level monitoring: Secondary | ICD-10-CM

## 2015-11-26 DIAGNOSIS — I4891 Unspecified atrial fibrillation: Secondary | ICD-10-CM

## 2015-11-26 NOTE — Progress Notes (Signed)
Pre visit review using our clinic review tool, if applicable. No additional management support is needed unless otherwise documented below in the visit note. 

## 2015-12-06 ENCOUNTER — Encounter (HOSPITAL_COMMUNITY): Payer: Self-pay | Admitting: Emergency Medicine

## 2015-12-06 ENCOUNTER — Inpatient Hospital Stay (HOSPITAL_COMMUNITY)
Admission: EM | Admit: 2015-12-06 | Discharge: 2015-12-11 | DRG: 871 | Disposition: A | Discharge status: Skilled Nursing Facility | Payer: MEDICARE | Attending: Internal Medicine | Admitting: Internal Medicine

## 2015-12-06 ENCOUNTER — Emergency Department (HOSPITAL_COMMUNITY): Payer: MEDICARE

## 2015-12-06 DIAGNOSIS — A419 Sepsis, unspecified organism: Principal | ICD-10-CM | POA: Diagnosis present

## 2015-12-06 DIAGNOSIS — J101 Influenza due to other identified influenza virus with other respiratory manifestations: Secondary | ICD-10-CM | POA: Diagnosis present

## 2015-12-06 DIAGNOSIS — I480 Paroxysmal atrial fibrillation: Secondary | ICD-10-CM | POA: Diagnosis present

## 2015-12-06 DIAGNOSIS — N183 Chronic kidney disease, stage 3 (moderate): Secondary | ICD-10-CM | POA: Diagnosis present

## 2015-12-06 DIAGNOSIS — A09 Infectious gastroenteritis and colitis, unspecified: Secondary | ICD-10-CM | POA: Diagnosis present

## 2015-12-06 DIAGNOSIS — I5020 Unspecified systolic (congestive) heart failure: Secondary | ICD-10-CM | POA: Diagnosis present

## 2015-12-06 DIAGNOSIS — R05 Cough: Secondary | ICD-10-CM | POA: Diagnosis present

## 2015-12-06 DIAGNOSIS — R6889 Other general symptoms and signs: Secondary | ICD-10-CM

## 2015-12-06 DIAGNOSIS — R197 Diarrhea, unspecified: Secondary | ICD-10-CM | POA: Diagnosis present

## 2015-12-06 DIAGNOSIS — K219 Gastro-esophageal reflux disease without esophagitis: Secondary | ICD-10-CM | POA: Diagnosis present

## 2015-12-06 DIAGNOSIS — G9340 Encephalopathy, unspecified: Secondary | ICD-10-CM | POA: Diagnosis present

## 2015-12-06 DIAGNOSIS — I2510 Atherosclerotic heart disease of native coronary artery without angina pectoris: Secondary | ICD-10-CM | POA: Diagnosis present

## 2015-12-06 DIAGNOSIS — R509 Fever, unspecified: Secondary | ICD-10-CM | POA: Diagnosis not present

## 2015-12-06 DIAGNOSIS — Z7901 Long term (current) use of anticoagulants: Secondary | ICD-10-CM

## 2015-12-06 DIAGNOSIS — I739 Peripheral vascular disease, unspecified: Secondary | ICD-10-CM | POA: Diagnosis present

## 2015-12-06 DIAGNOSIS — I639 Cerebral infarction, unspecified: Secondary | ICD-10-CM | POA: Diagnosis present

## 2015-12-06 DIAGNOSIS — I5022 Chronic systolic (congestive) heart failure: Secondary | ICD-10-CM | POA: Diagnosis present

## 2015-12-06 DIAGNOSIS — E039 Hypothyroidism, unspecified: Secondary | ICD-10-CM | POA: Diagnosis present

## 2015-12-06 DIAGNOSIS — N179 Acute kidney failure, unspecified: Secondary | ICD-10-CM | POA: Diagnosis present

## 2015-12-06 DIAGNOSIS — Z8673 Personal history of transient ischemic attack (TIA), and cerebral infarction without residual deficits: Secondary | ICD-10-CM

## 2015-12-06 DIAGNOSIS — N189 Chronic kidney disease, unspecified: Secondary | ICD-10-CM | POA: Diagnosis present

## 2015-12-06 DIAGNOSIS — R600 Localized edema: Secondary | ICD-10-CM

## 2015-12-06 DIAGNOSIS — R059 Cough, unspecified: Secondary | ICD-10-CM | POA: Diagnosis present

## 2015-12-06 DIAGNOSIS — E860 Dehydration: Secondary | ICD-10-CM | POA: Diagnosis present

## 2015-12-06 DIAGNOSIS — I4891 Unspecified atrial fibrillation: Secondary | ICD-10-CM | POA: Diagnosis present

## 2015-12-06 LAB — CBC WITH DIFFERENTIAL/PLATELET
BASOS ABS: 0 10*3/uL (ref 0.0–0.1)
Basophils Relative: 0 %
EOS ABS: 0 10*3/uL (ref 0.0–0.7)
EOS PCT: 0 %
HCT: 43 % (ref 39.0–52.0)
Hemoglobin: 13.5 g/dL (ref 13.0–17.0)
LYMPHS PCT: 12 %
Lymphs Abs: 0.8 10*3/uL (ref 0.7–4.0)
MCH: 27.1 pg (ref 26.0–34.0)
MCHC: 31.4 g/dL (ref 30.0–36.0)
MCV: 86.2 fL (ref 78.0–100.0)
MONO ABS: 0.4 10*3/uL (ref 0.1–1.0)
Monocytes Relative: 6 %
Neutro Abs: 5 10*3/uL (ref 1.7–7.7)
Neutrophils Relative %: 82 %
PLATELETS: 144 10*3/uL — AB (ref 150–400)
RBC: 4.99 MIL/uL (ref 4.22–5.81)
RDW: 15.3 % (ref 11.5–15.5)
WBC: 6.1 10*3/uL (ref 4.0–10.5)

## 2015-12-06 LAB — URINALYSIS, ROUTINE W REFLEX MICROSCOPIC
Bilirubin Urine: NEGATIVE
GLUCOSE, UA: NEGATIVE mg/dL
HGB URINE DIPSTICK: NEGATIVE
KETONES UR: NEGATIVE mg/dL
LEUKOCYTES UA: NEGATIVE
Nitrite: NEGATIVE
PROTEIN: 30 mg/dL — AB
Specific Gravity, Urine: 1.019 (ref 1.005–1.030)
pH: 6 (ref 5.0–8.0)

## 2015-12-06 LAB — COMPREHENSIVE METABOLIC PANEL
ALBUMIN: 3.2 g/dL — AB (ref 3.5–5.0)
ALT: 17 U/L (ref 17–63)
AST: 42 U/L — AB (ref 15–41)
Alkaline Phosphatase: 79 U/L (ref 38–126)
Anion gap: 13 (ref 5–15)
BILIRUBIN TOTAL: 1.2 mg/dL (ref 0.3–1.2)
BUN: 17 mg/dL (ref 6–20)
CO2: 19 mmol/L — ABNORMAL LOW (ref 22–32)
CREATININE: 1.75 mg/dL — AB (ref 0.61–1.24)
Calcium: 8.6 mg/dL — ABNORMAL LOW (ref 8.9–10.3)
Chloride: 106 mmol/L (ref 101–111)
GFR calc Af Amer: 37 mL/min — ABNORMAL LOW (ref >60)
GFR, EST NON AFRICAN AMERICAN: 32 mL/min — AB (ref >60)
GLUCOSE: 105 mg/dL — AB (ref 65–99)
Potassium: 4.7 mmol/L (ref 3.5–5.1)
Sodium: 138 mmol/L (ref 135–145)
TOTAL PROTEIN: 7.8 g/dL (ref 6.5–8.1)

## 2015-12-06 LAB — POC OCCULT BLOOD, ED: Fecal Occult Bld: NEGATIVE

## 2015-12-06 LAB — I-STAT TROPONIN, ED: Troponin i, poc: 0.04 ng/mL (ref 0.00–0.08)

## 2015-12-06 LAB — URINE MICROSCOPIC-ADD ON

## 2015-12-06 LAB — I-STAT CG4 LACTIC ACID, ED
LACTIC ACID, VENOUS: 1.48 mmol/L (ref 0.5–2.0)
Lactic Acid, Venous: 1.89 mmol/L (ref 0.5–2.0)

## 2015-12-06 LAB — PROCALCITONIN

## 2015-12-06 LAB — PROTIME-INR
INR: 1.74 — ABNORMAL HIGH (ref 0.00–1.49)
PROTHROMBIN TIME: 20.3 s — AB (ref 11.6–15.2)

## 2015-12-06 MED ORDER — SODIUM CHLORIDE 0.9 % IV SOLN
1500.0000 mg | Freq: Once | INTRAVENOUS | Status: AC
  Administered 2015-12-06: 1500 mg via INTRAVENOUS
  Filled 2015-12-06: qty 1500

## 2015-12-06 MED ORDER — PIPERACILLIN-TAZOBACTAM 3.375 G IVPB
3.3750 g | Freq: Three times a day (TID) | INTRAVENOUS | Status: DC
  Filled 2015-12-06 (×2): qty 50

## 2015-12-06 MED ORDER — SODIUM CHLORIDE 0.9 % IV BOLUS (SEPSIS)
1000.0000 mL | INTRAVENOUS | Status: AC
  Administered 2015-12-06 (×2): 1000 mL via INTRAVENOUS

## 2015-12-06 MED ORDER — VANCOMYCIN HCL IN DEXTROSE 750-5 MG/150ML-% IV SOLN
750.0000 mg | INTRAVENOUS | Status: DC
Start: 2015-12-07 — End: 2015-12-07

## 2015-12-06 MED ORDER — VANCOMYCIN HCL IN DEXTROSE 1-5 GM/200ML-% IV SOLN: 1000.0000 mg | Freq: Once | INTRAVENOUS | Status: DC

## 2015-12-06 MED ORDER — ACETAMINOPHEN 500 MG PO TABS
1000.0000 mg | ORAL_TABLET | Freq: Once | ORAL | Status: AC
  Administered 2015-12-06: 1000 mg via ORAL
  Filled 2015-12-06: qty 2

## 2015-12-06 MED ORDER — PIPERACILLIN-TAZOBACTAM 3.375 G IVPB 30 MIN
3.3750 g | Freq: Once | INTRAVENOUS | Status: AC
  Administered 2015-12-06: 3.375 g via INTRAVENOUS
  Filled 2015-12-06: qty 50

## 2015-12-06 MED ORDER — SODIUM CHLORIDE 0.9 % IV BOLUS (SEPSIS)
500.0000 mL | INTRAVENOUS | Status: AC
  Administered 2015-12-06: 500 mL via INTRAVENOUS

## 2015-12-06 NOTE — ED Provider Notes (Signed)
The patient is a 82 year old male who presents with a fever, chills and rigors. He has no other complaints including no coughing.  The family does report that he had diarrhea last week but is now become loose and improved. On exam the patient has a soft nontender abdomen, he is in atrial fibrillation with mild tachycardia, lungs are clear, oropharynx is clear and moist, there is mild bilateral peripheral edema but no rashes to the skin. Lab workup and chest x-ray did not reveal any specific findings, flu sample sent, the patient will need to be admitted due to fever of unknown origin and ongoing tachycardia.  , Medical screening examination/treatment/procedure(s) were conducted as a shared visit with non-physician practitioner(s) and myself.  I personally evaluated the patient during the encounter.  Clinical Impression:   Final diagnoses:  Fever of unknown origin  Rigors         Noemi Chapel, MD 12/11/15 2226

## 2015-12-06 NOTE — ED Provider Notes (Signed)
CSN: [id number]     Arrival date & time 12/06/15  1943 History   First MD Initiated Contact with Patient 12/06/15 2032     Chief Complaint  Patient presents with  . Fatigue  . Chills  . Altered Mental Status     (Consider location/radiation/quality/duration/timing/severity/associated sxs/prior Treatment) HPI  [employer]. Is a very pleasant 82 year old gentleman brought in by his family for weakness and loss of appetite. He has a past medical history of arthritis, reflux, thyroid disease, previous hip fracture, A. fib, left leg weakness. He is on chronic Coumadin therapy. His grandson with whom he lives, states that this morning he tried to stand up out of bed but was too weak to stand. Normally he is able to get himself up and do most things with some assistance. He also states that the patient was confused and forgot to put on his clothing. He states that normally his mental status is very good. Upon arrival patient was noted to have a fever. He denies history of urinary tract infections. He has had a slight cough as of late. His family has noticed some dark colored stools, but denies loose stools, or diarrhea. He has no previous history of GI bleeds.  Past Medical History  Diagnosis Date  . Arthritis   . GERD (gastroesophageal reflux disease)   . Thyroid disease   . Hip fracture ([place]) 11/11  . AF (atrial fibrillation) ([place])   . Left leg weakness     longstanding and of unclear origin, presumed CVA  . Chronic kidney disease     baseline Cr ~1.6   Past Surgical History  Procedure Laterality Date  . Hip surgery    . Eye surgery     Family History  Problem Relation Age of Onset  . Hypertension Mother   . Throat cancer Father    Social History  Substance Use Topics  . Smoking status: Never Smoker   . Smokeless tobacco: Never Used  . Alcohol Use: No    Review of Systems  Unable to review systems due to altered mental status  Allergies  Review of patient's allergies  indicates no known allergies.  Home Medications   Prior to Admission medications   Medication Sig Start Date End Date Taking? Authorizing Provider  carvedilol (COREG) 3.125 MG tablet Take 1 tablet (3.125 mg total) by mouth 2 (two) times daily with a meal. 02/09/15   Tonia Ghent, MD  furosemide (LASIX) 20 MG tablet Take 1 tablet (20 mg total) by mouth daily as needed for fluid (use as little as possible). 04/01/15   Tonia Ghent, MD  gabapentin (NEURONTIN) 100 MG capsule Take 1-2 capsules (100-200 mg total) by mouth at bedtime. 02/09/15   Tonia Ghent, MD  levothyroxine (SYNTHROID, LEVOTHROID) 88 MCG tablet Take 1 tablet (88 mcg total) by mouth daily. 02/09/15 02/22/16  Tonia Ghent, MD  naproxen sodium (ANAPROX) 220 MG tablet Take 220 mg by mouth daily as needed.    Historical Provider, MD  omeprazole (PRILOSEC OTC) 20 MG tablet Take 1 tablet (20 mg total) by mouth daily. 02/09/15   Tonia Ghent, MD  Polyethyl Glycol-Propyl Glycol (SYSTANE OP) Apply to eye as needed.    Historical Provider, MD  warfarin (COUMADIN) 5 MG tablet Take 0.5 tablets (2.5 mg total) by mouth daily. 02/10/15   Tonia Ghent, MD   BP 128/87 mmHg  Pulse 97  Temp(Src) 102.9 F (39.4 C) (Oral)  Resp 18  Ht 5'  6" (1.676 m)  Wt 77.111 kg  BMI 27.45 kg/m2  SpO2 97% Physical Exam  Constitutional: He appears well-developed and well-nourished. No distress.  No distress. In good spirits. Rigors.  HENT:  Head: Normocephalic and atraumatic.  Eyes: Conjunctivae are normal. No scleral icterus.  Neck: Normal range of motion. Neck supple.  Cardiovascular: Normal rate, regular rhythm and normal heart sounds.   Pulmonary/Chest: Effort normal and breath sounds normal. No respiratory distress.  Abdominal: Soft. Bowel sounds are normal. He exhibits no distension and no mass. There is no tenderness. There is no guarding.  Musculoskeletal: He exhibits no edema.  Neurological: He is alert.  Skin: Skin is warm and dry. He is  not diaphoretic.  Psychiatric: His behavior is normal.  Nursing note and vitals reviewed.   ED Course  Procedures (including critical care time) Labs Review Labs Reviewed  CULTURE, BLOOD (ROUTINE X 2)  CULTURE, BLOOD (ROUTINE X 2)  URINE CULTURE  COMPREHENSIVE METABOLIC PANEL  CBC WITH DIFFERENTIAL/PLATELET  URINALYSIS, ROUTINE W REFLEX MICROSCOPIC (NOT AT [facility])  PROCALCITONIN  PROTIME-INR  I-STAT CG4 LACTIC ACID, ED  I-STAT TROPOININ, ED    Imaging Review No results found. I have personally reviewed and evaluated these images and lab results as part of my medical decision-making.   EKG Interpretation None      MDM   Final diagnoses:  Fever of unknown origin  Rigors    8:58 PM BP 128/87 mmHg  Pulse 97  Temp(Src) 102.9 F (39.4 C) (Oral)  Resp 18  Ht 5\' 6"  (1.676 m)  Wt 77.111 kg  BMI 27.45 kg/m2  SpO2 97% Patient with fever, weakness. Will start a sepsis work up.  10:55 PM BP 125/91 mmHg  Pulse 95  Temp(Src) 102.9 F (39.4 C) (Oral)  Resp 21  Ht 5\' 6"  (1.676 m)  Wt 77.111 kg  BMI 27.45 kg/m2  SpO2 94% Patient without source for sepsis. He continues to be tachycardic. He states that he is feeling much better.  Patient will be admitted to the hospitalist service. Given his age and weakness as well as fever will need inpatient admission.  Margarita Mail, PA-C 12/10/15 2109  Noemi Chapel, MD 12/11/15 2225

## 2015-12-06 NOTE — ED Notes (Addendum)
Onset today general weakness inability to stand by himself, cough, chills, loss of appetite, and confusion stated by daughter.

## 2015-12-06 NOTE — Progress Notes (Signed)
Pharmacy Antibiotic Note  Mitchell Farrell. is a 82 y.o. male admitted on 12/06/2015 with sepsis.  Pharmacy has been consulted for Zosyn and vancomycin dosing. Tmax is 102.9. WBC wnl. One time doses ordered in the ED. SCr elevated at 1.75, CrCl ~66ml/min.  Plan: Change vancomycin to 1.5g IV x 1, then start vancomycin 750mg  IV Q24 Start Zosyn 3.375 gm IV q8h (4 hour infusion) Monitor clinical picture, renal function, VT prn F/U C&S, abx deescalation / LOT   Height: 5\' 6"  (167.6 cm) Weight: 170 lb (77.111 kg) IBW/kg (Calculated) : 63.8  Temp (24hrs), Avg:102.9 F (39.4 C), Min:102.9 F (39.4 C), Max:102.9 F (39.4 C)   Recent Labs Lab 12/06/15 2053  LATICACIDVEN 1.89    CrCl cannot be calculated (Patient has no serum creatinine result on file.).    No Known Allergies  Antimicrobials this admission: Zosyn 2/27 >>  Vancomycin 2/27 >>   Dose adjustments this admission: n/a  Microbiology results: 2/27 BCx:  2/27 UCx:     Thank you for allowing pharmacy to be a part of this patient's care.  Reginia Naas 12/06/2015 9:11 PM

## 2015-12-06 NOTE — Progress Notes (Signed)
Pharmacy Code Sepsis Protocol  Time of code sepsis page: 2034  Abx given at 2127  Were antibiotics ordered at the time of the code sepsis page? No   Was it required to contact the physician? [x]  Physician not contacted []  Physician contacted to order antibiotics for code sepsis []  Physician contacted to recommend changing antibiotics  Pharmacy consulted for: Zosyn and vancomycin  Anti-infectives    Start     Dose/Rate Route Frequency Ordered Stop   12/06/15 2115  piperacillin-tazobactam (ZOSYN) IVPB 3.375 g     3.375 g 100 mL/hr over 30 Minutes Intravenous  Once 12/06/15 2108     12/06/15 2115  vancomycin (VANCOCIN) IVPB 1000 mg/200 mL premix  Status:  Discontinued     1,000 mg 200 mL/hr over 60 Minutes Intravenous  Once 12/06/15 2108 12/06/15 2110   12/06/15 2115  vancomycin (VANCOCIN) 1,500 mg in sodium chloride 0.9 % 500 mL IVPB     1,500 mg 250 mL/hr over 120 Minutes Intravenous  Once 12/06/15 2110          Nurse education provided: [x]  Minutes left to administer antibiotics to achieve 1 hour goal [x]  Correct order of antibiotic administration [x]  Antibiotic Y-site compatibilities     Reginia Naas, PharmD 12/06/2015, 9:35 PM

## 2015-12-07 ENCOUNTER — Encounter (HOSPITAL_COMMUNITY): Payer: MEDICARE

## 2015-12-07 DIAGNOSIS — R05 Cough: Secondary | ICD-10-CM | POA: Diagnosis not present

## 2015-12-07 DIAGNOSIS — I2510 Atherosclerotic heart disease of native coronary artery without angina pectoris: Secondary | ICD-10-CM | POA: Diagnosis present

## 2015-12-07 DIAGNOSIS — Z8673 Personal history of transient ischemic attack (TIA), and cerebral infarction without residual deficits: Secondary | ICD-10-CM | POA: Diagnosis not present

## 2015-12-07 DIAGNOSIS — R059 Cough, unspecified: Secondary | ICD-10-CM | POA: Diagnosis present

## 2015-12-07 DIAGNOSIS — E039 Hypothyroidism, unspecified: Secondary | ICD-10-CM | POA: Diagnosis present

## 2015-12-07 DIAGNOSIS — I739 Peripheral vascular disease, unspecified: Secondary | ICD-10-CM | POA: Diagnosis present

## 2015-12-07 DIAGNOSIS — A419 Sepsis, unspecified organism: Secondary | ICD-10-CM | POA: Diagnosis present

## 2015-12-07 DIAGNOSIS — J101 Influenza due to other identified influenza virus with other respiratory manifestations: Secondary | ICD-10-CM | POA: Diagnosis not present

## 2015-12-07 DIAGNOSIS — N179 Acute kidney failure, unspecified: Secondary | ICD-10-CM | POA: Diagnosis present

## 2015-12-07 DIAGNOSIS — E860 Dehydration: Secondary | ICD-10-CM | POA: Diagnosis present

## 2015-12-07 DIAGNOSIS — G9340 Encephalopathy, unspecified: Secondary | ICD-10-CM | POA: Diagnosis present

## 2015-12-07 DIAGNOSIS — R600 Localized edema: Secondary | ICD-10-CM

## 2015-12-07 DIAGNOSIS — K219 Gastro-esophageal reflux disease without esophagitis: Secondary | ICD-10-CM | POA: Diagnosis present

## 2015-12-07 DIAGNOSIS — I482 Chronic atrial fibrillation: Secondary | ICD-10-CM

## 2015-12-07 DIAGNOSIS — N183 Chronic kidney disease, stage 3 (moderate): Secondary | ICD-10-CM | POA: Diagnosis present

## 2015-12-07 DIAGNOSIS — Z7901 Long term (current) use of anticoagulants: Secondary | ICD-10-CM | POA: Diagnosis not present

## 2015-12-07 DIAGNOSIS — A09 Infectious gastroenteritis and colitis, unspecified: Secondary | ICD-10-CM | POA: Diagnosis present

## 2015-12-07 DIAGNOSIS — I4891 Unspecified atrial fibrillation: Secondary | ICD-10-CM | POA: Diagnosis present

## 2015-12-07 DIAGNOSIS — R509 Fever, unspecified: Secondary | ICD-10-CM | POA: Diagnosis present

## 2015-12-07 DIAGNOSIS — I5022 Chronic systolic (congestive) heart failure: Secondary | ICD-10-CM | POA: Diagnosis present

## 2015-12-07 DIAGNOSIS — R197 Diarrhea, unspecified: Secondary | ICD-10-CM

## 2015-12-07 LAB — CBC
HEMATOCRIT: 39.6 % (ref 39.0–52.0)
HEMOGLOBIN: 12.2 g/dL — AB (ref 13.0–17.0)
MCH: 26.6 pg (ref 26.0–34.0)
MCHC: 30.8 g/dL (ref 30.0–36.0)
MCV: 86.3 fL (ref 78.0–100.0)
Platelets: 145 10*3/uL — ABNORMAL LOW (ref 150–400)
RBC: 4.59 MIL/uL (ref 4.22–5.81)
RDW: 15.4 % (ref 11.5–15.5)
WBC: 6.4 10*3/uL (ref 4.0–10.5)

## 2015-12-07 LAB — BRAIN NATRIURETIC PEPTIDE: B Natriuretic Peptide: 252.9 pg/mL — ABNORMAL HIGH (ref 0.0–100.0)

## 2015-12-07 LAB — INFLUENZA PANEL BY PCR (TYPE A & B)
H1N1 flu by pcr: NOT DETECTED
INFLBPCR: POSITIVE — AB
Influenza A By PCR: NEGATIVE

## 2015-12-07 LAB — LACTIC ACID, PLASMA: Lactic Acid, Venous: 1.1 mmol/L (ref 0.5–2.0)

## 2015-12-07 LAB — BASIC METABOLIC PANEL
Anion gap: 11 (ref 5–15)
BUN: 17 mg/dL (ref 6–20)
CO2: 21 mmol/L — ABNORMAL LOW (ref 22–32)
Calcium: 8 mg/dL — ABNORMAL LOW (ref 8.9–10.3)
Chloride: 108 mmol/L (ref 101–111)
Creatinine, Ser: 1.74 mg/dL — ABNORMAL HIGH (ref 0.61–1.24)
GFR calc Af Amer: 37 mL/min — ABNORMAL LOW (ref >60)
GFR, EST NON AFRICAN AMERICAN: 32 mL/min — AB (ref >60)
GLUCOSE: 109 mg/dL — AB (ref 65–99)
Potassium: 4.2 mmol/L (ref 3.5–5.1)
Sodium: 140 mmol/L (ref 135–145)

## 2015-12-07 LAB — APTT: APTT: 25 s (ref 24–37)

## 2015-12-07 LAB — PROTIME-INR
INR: 2.03 — ABNORMAL HIGH (ref 0.00–1.49)
Prothrombin Time: 22.8 seconds — ABNORMAL HIGH (ref 11.6–15.2)

## 2015-12-07 MED ORDER — SODIUM CHLORIDE 0.9 % IV SOLN
INTRAVENOUS | Status: DC
  Administered 2015-12-07: 03:00:00 via INTRAVENOUS

## 2015-12-07 MED ORDER — LEVOTHYROXINE SODIUM 88 MCG PO TABS
88.0000 ug | ORAL_TABLET | Freq: Every day | ORAL | Status: DC
  Administered 2015-12-07 – 2015-12-11 (×5): 88 ug via ORAL
  Filled 2015-12-07 (×5): qty 1

## 2015-12-07 MED ORDER — CARVEDILOL 3.125 MG PO TABS
3.1250 mg | ORAL_TABLET | Freq: Two times a day (BID) | ORAL | Status: DC
  Administered 2015-12-07 – 2015-12-11 (×5): 3.125 mg via ORAL
  Filled 2015-12-07 (×5): qty 1

## 2015-12-07 MED ORDER — OSELTAMIVIR PHOSPHATE 30 MG PO CAPS
30.0000 mg | ORAL_CAPSULE | Freq: Every day | ORAL | Status: DC
  Administered 2015-12-07 – 2015-12-08 (×2): 30 mg via ORAL
  Filled 2015-12-07 (×4): qty 1

## 2015-12-07 MED ORDER — WARFARIN - PHARMACIST DOSING INPATIENT
Freq: Every day | Status: DC
  Administered 2015-12-09 – 2015-12-10 (×2)

## 2015-12-07 MED ORDER — FAMOTIDINE IN NACL 20-0.9 MG/50ML-% IV SOLN
20.0000 mg | Freq: Two times a day (BID) | INTRAVENOUS | Status: DC
  Administered 2015-12-07 – 2015-12-08 (×4): 20 mg via INTRAVENOUS
  Filled 2015-12-07 (×5): qty 50

## 2015-12-07 MED ORDER — GUAIFENESIN ER 600 MG PO TB12
600.0000 mg | ORAL_TABLET | Freq: Two times a day (BID) | ORAL | Status: DC
  Administered 2015-12-07 – 2015-12-11 (×10): 600 mg via ORAL
  Filled 2015-12-07 (×10): qty 1

## 2015-12-07 MED ORDER — ONDANSETRON HCL 4 MG PO TABS: 4.0000 mg | ORAL_TABLET | Freq: Four times a day (QID) | ORAL | Status: DC | PRN

## 2015-12-07 MED ORDER — POLYVINYL ALCOHOL 1.4 % OP SOLN
1.0000 [drp] | OPHTHALMIC | Status: DC | PRN
  Filled 2015-12-07: qty 15

## 2015-12-07 MED ORDER — GABAPENTIN 100 MG PO CAPS
100.0000 mg | ORAL_CAPSULE | Freq: Every day | ORAL | Status: DC
  Administered 2015-12-07 – 2015-12-10 (×5): 100 mg via ORAL
  Filled 2015-12-07 (×3): qty 1
  Filled 2015-12-07: qty 2
  Filled 2015-12-07: qty 1

## 2015-12-07 MED ORDER — ACETAMINOPHEN 325 MG PO TABS
650.0000 mg | ORAL_TABLET | Freq: Four times a day (QID) | ORAL | Status: DC | PRN
  Filled 2015-12-07 (×2): qty 2

## 2015-12-07 MED ORDER — WARFARIN SODIUM 5 MG PO TABS
5.0000 mg | ORAL_TABLET | Freq: Once | ORAL | Status: AC
  Administered 2015-12-07: 5 mg via ORAL
  Filled 2015-12-07: qty 1

## 2015-12-07 MED ORDER — METRONIDAZOLE 500 MG PO TABS
500.0000 mg | ORAL_TABLET | Freq: Once | ORAL | Status: AC
  Administered 2015-12-07: 500 mg via ORAL
  Filled 2015-12-07: qty 1

## 2015-12-07 MED ORDER — WARFARIN SODIUM 2.5 MG PO TABS
2.5000 mg | ORAL_TABLET | Freq: Once | ORAL | Status: AC
  Administered 2015-12-07: 2.5 mg via ORAL
  Filled 2015-12-07: qty 1

## 2015-12-07 MED ORDER — SODIUM CHLORIDE 0.9% FLUSH
3.0000 mL | Freq: Two times a day (BID) | INTRAVENOUS | Status: DC
  Administered 2015-12-07 – 2015-12-11 (×9): 3 mL via INTRAVENOUS

## 2015-12-07 MED ORDER — ONDANSETRON HCL 4 MG/2ML IJ SOLN: 4.0000 mg | Freq: Four times a day (QID) | INTRAMUSCULAR | Status: DC | PRN

## 2015-12-07 MED ORDER — ACETAMINOPHEN 650 MG RE SUPP
650.0000 mg | Freq: Four times a day (QID) | RECTAL | Status: DC | PRN
Start: 2015-12-07 — End: 2015-12-11

## 2015-12-07 NOTE — Progress Notes (Signed)
ANTICOAGULATION CONSULT NOTE - Initial Consult  Pharmacy Consult for Coumadin Indication: atrial fibrillation  No Known Allergies  Patient Measurements: Height: 5\' 6"  (167.6 cm) Weight: 170 lb (77.111 kg) IBW/kg (Calculated) : 63.8  Vital Signs: Temp: 102.9 F (39.4 C) (02/27 2012) Temp Source: Oral (02/27 2012) BP: 126/84 mmHg (02/28 0000) Pulse Rate: 75 (02/28 0014)  Labs:  Recent Labs  12/06/15 2041  HGB 13.5  HCT 43.0  PLT 144*  LABPROT 20.3*  INR 1.74*  CREATININE 1.75*    Estimated Creatinine Clearance: 25.2 mL/min (by C-G formula based on Cr of 1.75).   Medical History: Past Medical History  Diagnosis Date  . Arthritis   . GERD (gastroesophageal reflux disease)   . Thyroid disease   . Hip fracture ([place]) 11/11  . AF (atrial fibrillation) ([place])   . Left leg weakness     longstanding and of unclear origin, presumed CVA  . Chronic kidney disease     baseline Cr ~1.6     Assessment: 82yo male c/o generalized weakness, cough, chills, anorexia, and confusion, tx'd for sepsis, to continue Coumadin for Afib during admission; current INR below goal with last dose taken 2/26.  Goal of Therapy:  INR 2-3   Plan:  Will give boosted Coumadin dose of 5mg  po x1 now and monitor INR for dose adjustments.  Wynona Neat, PharmD, BCPS  12/07/2015,12:41 AM

## 2015-12-07 NOTE — Progress Notes (Signed)
ANTICOAGULATION CONSULT NOTE - Follow Up Consult  Pharmacy Consult for Coumadin Indication: atrial fibrillation  No Known Allergies  Patient Measurements: Height: 5\' 6"  (167.6 cm) Weight: 177 lb 6.4 oz (80.468 kg) (bedscale ) IBW/kg (Calculated) : 63.8  Vital Signs: Temp: 98.1 F (36.7 C) (02/28 0643) Temp Source: Oral (02/28 0643) BP: 119/91 mmHg (02/28 0643) Pulse Rate: 52 (02/28 0643)  Labs:  Recent Labs  12/06/15 2041 12/07/15 0220  HGB 13.5 12.2*  HCT 43.0 39.6  PLT 144* 145*  APTT 25  --   LABPROT 20.3* 22.8*  INR 1.74* 2.03*  CREATININE 1.75* 1.74*    Estimated Creatinine Clearance: 25.9 mL/min (by C-G formula based on Cr of 1.74).  Assessment: Mitchell Farrell. is a 82 y.o. male admitted on 12/06/2015 with sepsis (eneralized weakness, cough, chills, anorexia, diarrhea, and confusion). Pharmacy has been consulted for Zosyn and vancomycin dosing. Tmax is 102.9. WBC wnl. One time doses ordered in the ED. SCr elevated at 1.75, CrCl ~26ml/min.  Anticoagulation: Coumadin PTA for afib. (One dose of Flagyl was given 2/28 at 0313, watch for interaction with INR). INR this AM 1.74>2.03 in 6 hrs. - PTA Coumadin 2.5mg  daily. Admit INR 1.74 on 2/27.  Goal of Therapy:  INR 2-3 Monitor platelets by anticoagulation protocol: Yes   Plan:  Coumadin 2.5mg  po x 1 tonight Daily INR. Monitor for significant rise in INR due to Flagyl dose given.   Garlon Tuggle S. Alford Highland, PharmD, BCPS Clinical Staff Pharmacist Pager [phone]  Eilene Ghazi Stillinger 12/07/2015,9:47 AM

## 2015-12-07 NOTE — Progress Notes (Signed)
Patient admitted earlier today by Dr. Ivor Costa. See H&P for details. Agree with assessment and plan.  82 year old male with history of CHF, CAD stage III, atrial fibrillation on Coumadin presented to emergency department with complaints of fever, chills, diarrhea and confusion. Patient found to have positive influenza B as well as sepsis. Currently on broad-spectrum antibiotics including vancomycin and Zosyn along with Tamiflu. There is suspicion for C. difficile colitis, C. difficile pending. Currently acute encephalopathy does appear to be back at baseline.  Time spent: 20 minutes  Silvia Markuson D.O. [facility] Pager [phone]  If 7PM-7AM, please contact night-coverage [email or web address] Password [email or web address] 12/07/2015, 3:25 PM

## 2015-12-07 NOTE — Evaluation (Signed)
Physical Therapy Evaluation Patient Details Name: Mitchell Farrell. MRN: [id number] DOB: Sep 19, 1919 Today's Date: 12/07/2015   History of Present Illness  82 y.o. male with PMH of atrial fibrillation on Coumadin, chronic kidney disease-stage III, arthritis, chronic leg weakness, CVA, systolic congestive heart failure, GERD, hypothyroidism, who presents with cough, fever, chills, diarrhea and confusion  Clinical Impression  Patient demonstrates deficits in functional mobility as indicated below. Will need continued skilled PT to address deficits and maximize function. Will see as indicated and progress as tolerated. OF NOTE: At this time, patient requiring significant assist to transfer OOB to chair and could not tolerate any ambulation safely. Highly recommend ST SNF upon acute discharge.     Follow Up Recommendations SNF;Supervision/Assistance - 24 hour (needs +2 physical assist at this time)    Equipment Recommendations  None recommended by PT    Recommendations for Other Services       Precautions / Restrictions Precautions Precautions: Fall Restrictions Weight Bearing Restrictions: No      Mobility  Bed Mobility Overal bed mobility: Needs Assistance Bed Mobility: Supine to Sit     Supine to sit: Mod assist     General bed mobility comments: Moderate assist to elevate trunk to upright and rotate hips to EOB  Transfers Overall transfer level: Needs assistance Equipment used: Rolling walker (2 wheeled);2 person hand held assist Transfers: Sit to/from [facility] Sit to Stand: Max assist;+2 physical assistance;From elevated surface Stand pivot transfers: Max assist;+2 physical assistance;From elevated surface       General transfer comment: Max assist to elevate to standing, patient with poor ability to power up or reach upright despite increased physical assist using RW.  Changed to face to face transfer for pivot to chair. Continued to required +2 max  assist with LLE knee blocking and using chuck pad and gait belt  Ambulation/Gait             General Gait Details: could not safely perform  Stairs            Wheelchair Mobility    Modified Rankin (Stroke Patients Only)       Balance Overall balance assessment: Needs assistance;History of Falls Sitting-balance support: Bilateral upper extremity supported;Feet supported Sitting balance-Leahy Scale: Fair     Standing balance support: Bilateral upper extremity supported;During functional activity Standing balance-Leahy Scale: Zero                               Pertinent Vitals/Pain Pain Assessment: Faces Faces Pain Scale: Hurts little more Pain Location: back and legs Pain Descriptors / Indicators: Aching Pain Intervention(s): Premedicated before session;Repositioned;Limited activity within patient's tolerance;Monitored during session    [place] expects to be discharged to:: Private residence Living Arrangements: Children Available Help at Discharge: Family Type of Home: House Home Access: Ramped entrance     Home Layout: Two level;Able to live on main level with bedroom/bathroom Home Equipment: Gilford Rile - 2 wheels;Wheelchair - manual;Bedside commode;Grab bars - tub/shower;Hand held shower head;Tub bench      Prior Function Level of Independence: Needs assistance   Gait / Transfers Assistance Needed: patient states that he occasionally has assist with some functional activities, otherwise independent with assistive device (RW vs w/c)           Hand Dominance   Dominant Hand: Right    Extremity/Trunk Assessment   Upper Extremity Assessment: Generalized weakness  Lower Extremity Assessment: Generalized weakness;LLE deficits/detail (+ arthritic changes)   LLE Deficits / Details: residual CVA deficits noted, strength 3/5     Communication   Communication: HOH  Cognition Arousal/Alertness:  Awake/alert Behavior During Therapy: WFL for tasks assessed/performed Overall Cognitive Status: No family/caregiver present to determine baseline cognitive functioning                      General Comments      Exercises        Assessment/Plan    PT Assessment Patient needs continued PT services  PT Diagnosis Difficulty walking;Abnormality of gait;Generalized weakness;Acute pain   PT Problem List Decreased strength;Decreased activity tolerance;Decreased balance;Decreased mobility;Decreased coordination;Decreased knowledge of use of DME;Decreased safety awareness;Pain  PT Treatment Interventions DME instruction;Gait training;Functional mobility training;Therapeutic activities;Therapeutic exercise;Balance training;Patient/family education   PT Goals (Current goals can be found in the Care Plan section) Acute Rehab PT Goals Patient Stated Goal: to go home PT Goal Formulation: With patient Time For Goal Achievement: 12/21/15 Potential to Achieve Goals: Good    Frequency Min 2X/week   Barriers to discharge Decreased caregiver support requiring +2 physical assist    Co-evaluation               End of Session Equipment Utilized During Treatment: Gait belt Activity Tolerance: Patient limited by fatigue Patient left: in chair;with call bell/phone within reach;with chair alarm set Nurse Communication: Mobility status         Time: [id number] PT Time Calculation (min) (ACUTE ONLY): 22 min   Charges:   PT Evaluation $PT Eval Moderate Complexity: 1 Procedure     PT G CodesDuncan Dull 01/01/16, 12:05 PM Alben Deeds, [place] DPT  [phone]

## 2015-12-07 NOTE — H&P (Signed)
[facility] History and Physical  [employer]. [id number] DOB: [birth year] DOA: 12/06/2015  Referring physician: ED physician PCP: Elsie Stain, MD  Specialists:   Chief Complaint: Fever, chills, cough, diarrhea and confusion  HPI: Mitchell Farrell. is a 82 y.o. male with PMH of atrial fibrillation on Coumadin, chronic kidney disease-stage III, arthritis, chronic leg weakness, CVA, systolic congestive heart failure, GERD, hypothyroidism, who presents with cough, fever, chills, diarrhea and confusion.  Per his daughter, patient is actively functioning at baseline. He has been having dry cough, fever, chills in the past 3 days. He also has runny nose and dry throat. Patient does not have chest pain or shortness breath. He has generalized weakness, and mild confusion. Daughter also reports that patient has 3 -day diarrhea in the last week, which improved gradually. He had 2 loose bowel movement today. Patient does not have nausea, vomiting, abdominal pain. Per daughter, patient does not have symptoms of UTI or unilateral weakness.  In ED, patient was found to have negative urinalysis, troponin negative, lactate 1.89-->1.49, INR 1.74, WBC 6.1, tachycardia, temperature 102.9, slightly worsening renal function, negative chest x-ray for acute abnormalities. Patient is admitted to inpatient for further eval and treatment.  EKG: Independently reviewed.  QTC 480. Seems to have multifocal atrial rhythm vs. Afib.  Where does patient live?   At home    Can patient participate in ADLs?  Little   Review of Systems:   General: has fevers, chills, no changes in body weight, has poor appetite, has fatigue HEENT: no blurry vision, hearing changes or sore throat Pulm: no dyspnea, has coughing, no wheezing CV: no chest pain, no palpitations Abd: no nausea, vomiting, abdominal pain, has diarrhea, no constipation GU: no dysuria, burning on urination, increased urinary frequency, hematuria  Ext: no  leg edema Neuro: no unilateral weakness, numbness, or tingling, no vision change or hearing loss Skin: no rash MSK: No muscle spasm, no deformity, no limitation of range of movement in spin Heme: No easy bruising.  Travel history: No recent long distant travel.  Allergy: No Known Allergies  Past Medical History  Diagnosis Date  . Arthritis   . GERD (gastroesophageal reflux disease)   . Thyroid disease   . Hip fracture ([place]) 11/11  . AF (atrial fibrillation) ([place])   . Left leg weakness     longstanding and of unclear origin, presumed CVA  . Chronic kidney disease     baseline Cr ~1.6    Past Surgical History  Procedure Laterality Date  . Hip surgery    . Eye surgery      Social History:  reports that he has never smoked. He has never used smokeless tobacco. He reports that he does not drink alcohol or use illicit drugs.  Family History:  Family History  Problem Relation Age of Onset  . Hypertension Mother   . Throat cancer Father      Prior to Admission medications   Medication Sig Start Date End Date Taking? Authorizing Provider  carvedilol (COREG) 3.125 MG tablet Take 1 tablet (3.125 mg total) by mouth 2 (two) times daily with a meal. 02/09/15  Yes Tonia Ghent, MD  furosemide (LASIX) 20 MG tablet Take 1 tablet (20 mg total) by mouth daily as needed for fluid (use as little as possible). 04/01/15  Yes Tonia Ghent, MD  gabapentin (NEURONTIN) 100 MG capsule Take 1-2 capsules (100-200 mg total) by mouth at bedtime. 02/09/15  Yes Tonia Ghent, MD  levothyroxine (SYNTHROID,  LEVOTHROID) 88 MCG tablet Take 1 tablet (88 mcg total) by mouth daily. 02/09/15 02/22/16 Yes Tonia Ghent, MD  naproxen sodium (ANAPROX) 220 MG tablet Take 220 mg by mouth daily as needed (for pain).    Yes Historical Provider, MD  omeprazole (PRILOSEC OTC) 20 MG tablet Take 1 tablet (20 mg total) by mouth daily. 02/09/15  Yes Tonia Ghent, MD  Polyethyl Glycol-Propyl Glycol (SYSTANE OP) Apply to eye  as needed.   Yes Historical Provider, MD  warfarin (COUMADIN) 5 MG tablet Take 0.5 tablets (2.5 mg total) by mouth daily. Patient taking differently: Take 2.5 mg by mouth daily at 6 PM.  02/10/15  Yes Tonia Ghent, MD    Physical Exam: Filed Vitals:   12/06/15 2300 12/06/15 2330 12/07/15 0000 12/07/15 0014  BP: 117/82 120/74 126/84   Pulse: 66 62  75  Temp:      TempSrc:      Resp: 16 19  23   Height:      Weight:      SpO2: 98% 96% 99% 100%   General: Not in acute distress HEENT:       Eyes: PERRL, EOMI, no scleral icterus.       ENT: No discharge from the ears and nose, no pharynx injection, no tonsillar enlargement.        Neck: No JVD, no bruit, no mass felt. Heme: No neck lymph node enlargement. Cardiac: S1/S2, RRR, No murmurs, No gallops or rubs. Pulm: No rales, wheezing, rhonchi or rubs. Abd: Soft, nondistended, nontender, no rebound pain, no organomegaly, BS present. Ext: Has asymmetric leg edema. Left leg has trace amount of leg edema, right leg has 2+ edema. 2+DP/PT pulse bilaterally. Musculoskeletal: No joint deformities, No joint redness or warmth, no limitation of ROM in spin. Skin: No rashes.  Neuro: Alert, mildly confused, but oriented X3, cranial nerves II-XII grossly intact, moves all extremities normally. Psych: Patient is not psychotic, no suicidal or hemocidal ideation.  Labs on Admission:  Basic Metabolic Panel:  Recent Labs Lab 12/06/15 2041 12/07/15 0220  NA 138 140  K 4.7 4.2  CL 106 108  CO2 19* 21*  GLUCOSE 105* 109*  BUN 17 17  CREATININE 1.75* 1.74*  CALCIUM 8.6* 8.0*   Liver Function Tests:  Recent Labs Lab 12/06/15 2041  AST 42*  ALT 17  ALKPHOS 79  BILITOT 1.2  PROT 7.8  ALBUMIN 3.2*   No results for input(s): LIPASE, AMYLASE in the last 168 hours. No results for input(s): AMMONIA in the last 168 hours. CBC:  Recent Labs Lab 12/06/15 2041 12/07/15 0220  WBC 6.1 6.4  NEUTROABS 5.0  --   HGB 13.5 12.2*  HCT 43.0 39.6   MCV 86.2 86.3  PLT 144* 145*   Cardiac Enzymes: No results for input(s): CKTOTAL, CKMB, CKMBINDEX, TROPONINI in the last 168 hours.  BNP (last 3 results)  Recent Labs  12/07/15 0220  BNP 252.9*    ProBNP (last 3 results) No results for input(s): PROBNP in the last 8760 hours.  CBG: No results for input(s): GLUCAP in the last 168 hours.  Radiological Exams on Admission: Dg Chest Port 1 View  12/06/2015  CLINICAL DATA:  Onset today general weakness inability to stand by himself, cough, chills, loss of appetite, and confusion stated by daughter. hx: afib, CKD EXAM: PORTABLE CHEST 1 VIEW COMPARISON:  02/19/2014 FINDINGS: Midline trachea. Normal heart size. Right paratracheal soft tissue fullness is similar to the 2015 exam and likely  due to prominent great vessels. Right hemidiaphragm elevation. No pleural effusion or pneumothorax. Suspect areas of right greater the left scarring with similar nonspecific lower lobe predominant interstitial prominence. No superimposed consolidation. No congestive failure. IMPRESSION: No acute cardiopulmonary disease. Electronically Signed   By: Abigail Miyamoto M.D.   On: 12/06/2015 21:02    Assessment/Plan Principal Problem:   Sepsis ([place]) Active Problems:   AF (atrial fibrillation) ([facility])   Hypothyroidism   Warfarin anticoagulation   Acute renal failure superimposed on stage 3 chronic kidney disease ([facility])   PAD (peripheral artery disease) ([facility])   Systolic CHF ([place])   Acute CVA (cerebrovascular accident) ([place])   GERD (gastroesophageal reflux disease)   Cough   Diarrhea  Sepsis [facility]): Patient meets criteria for sepsis with fever and tachycardia. Lactate was normal. Hemodynamically stable. Initially the source of infection was not clear, patient was started with vancomycin and Zosyn by EDP. I also gave one dose of flagyl due to diarrhea and suspicion of C. difficile colitis. During the completion of this note, Flu pcr comes back positive for flu B.  Therefore the most likely source of infection is Flu B.  -pt is admitted to tele bed (due to Afib) -will start Tamiflu -Discontinue vanco and zosyn -will get Procalcitonin and trend lactic acid levels per sepsis protocol. -IVF: 2.5L of NS bolus in ED, followed by 75 cc/h   Cough: likely due to Flu B. No infiltration on chest x-ray. -Mucinex for cough -On Tamiflu  Diarrhea: Likely due to flu B, but need to rule out other possibilities, such as C. difficile colitis. He was given 1 dose of Flagyl before flu PCR comes back. -Check C. difficile PCR and Gi path panel -IV fluid as above -When necessary Zofran for nausea  Acute encephalopathy: Patient has mild confusion. Likely due to multifactorial etiologies, including sepsis, flu, worsening renal function and dehydration. -Treat underlying problems -neuro checks frequently  GERD: -will switch PPI to pepcid IV until C diff pcr negative  Atrial Fibrillation: CHA2DS2-VASc Score is 4, needs oral anticoagulation. Patient is on Coumadin. INR is 1.74 on admission. Heart rate is ~110. -continue coumadin per pharmacy -Continue Coreg  Hypothyroidism: Last TSH was 1.30 on 09/20/15 -Continue home Synthroid  Acute renal failure superimposed on stage 3 chronic kidney disease ([place]): Mildly worsened kidney function. Baseline creatinine 1.3-1.6, his creatinine is 1.75, BUN 17 on admission. Likely due to prerenal secondary to dehydration and continuation of diruetics, NSAIDs. - IVF as above - Follow up renal function by BMP - Hold Lasix and naproxen  Systolic CHF [facility]): Patient used to have a systolic congestive heart failure. 2-D echo on 02/07/13 showed EF 30-35 percent. Recent 2-D echo 02/19/14 showed EF 55-60 percent. Patient has asymmetrical leg edema (R>L). Lung is clear, does not seem to have acute exacerbation. -will hold Lasix due to sepsis -Check BNP -Continue Coreg -LE venous doppler to r/o DVT in R leg   DVT ppx: on coumadin Code Status:  Full code Family Communication: Yes, patient's daughter and son-in-law at bed side Disposition Plan: Admit to inpatient   Date of Service 12/07/2015    Ivor Costa [facility] Pager [phone]  If 7PM-7AM, please contact night-coverage [email or web address] Password [email or web address] 12/07/2015, 3:34 AM

## 2015-12-08 ENCOUNTER — Encounter (HOSPITAL_COMMUNITY): Payer: MEDICARE

## 2015-12-08 DIAGNOSIS — J101 Influenza due to other identified influenza virus with other respiratory manifestations: Secondary | ICD-10-CM

## 2015-12-08 LAB — PROTIME-INR
INR: 2.18 — ABNORMAL HIGH (ref 0.00–1.49)
Prothrombin Time: 24.1 seconds — ABNORMAL HIGH (ref 11.6–15.2)

## 2015-12-08 LAB — RESPIRATORY VIRUS PANEL
ADENOVIRUS: NEGATIVE
INFLUENZA A: NEGATIVE
Influenza B: POSITIVE — AB
Metapneumovirus: NEGATIVE
PARAINFLUENZA 1 A: NEGATIVE
PARAINFLUENZA 2 A: NEGATIVE
PARAINFLUENZA 3 A: NEGATIVE
RESPIRATORY SYNCYTIAL VIRUS A: NEGATIVE
Respiratory Syncytial Virus B: NEGATIVE
Rhinovirus: NEGATIVE

## 2015-12-08 LAB — CBC
HEMATOCRIT: 34.7 % — AB (ref 39.0–52.0)
Hemoglobin: 10.7 g/dL — ABNORMAL LOW (ref 13.0–17.0)
MCH: 26.5 pg (ref 26.0–34.0)
MCHC: 30.8 g/dL (ref 30.0–36.0)
MCV: 85.9 fL (ref 78.0–100.0)
Platelets: 123 10*3/uL — ABNORMAL LOW (ref 150–400)
RBC: 4.04 MIL/uL — AB (ref 4.22–5.81)
RDW: 15.6 % — AB (ref 11.5–15.5)
WBC: 4.2 10*3/uL (ref 4.0–10.5)

## 2015-12-08 LAB — BASIC METABOLIC PANEL
ANION GAP: 10 (ref 5–15)
BUN: 17 mg/dL (ref 6–20)
CHLORIDE: 110 mmol/L (ref 101–111)
CO2: 21 mmol/L — AB (ref 22–32)
CREATININE: 1.57 mg/dL — AB (ref 0.61–1.24)
Calcium: 8 mg/dL — ABNORMAL LOW (ref 8.9–10.3)
GFR calc non Af Amer: 36 mL/min — ABNORMAL LOW (ref >60)
GFR, EST AFRICAN AMERICAN: 42 mL/min — AB (ref >60)
Glucose, Bld: 85 mg/dL (ref 65–99)
POTASSIUM: 4.1 mmol/L (ref 3.5–5.1)
SODIUM: 141 mmol/L (ref 135–145)

## 2015-12-08 LAB — URINE CULTURE: CULTURE: NO GROWTH

## 2015-12-08 LAB — GLUCOSE, CAPILLARY: GLUCOSE-CAPILLARY: 89 mg/dL (ref 65–99)

## 2015-12-08 MED ORDER — FAMOTIDINE 20 MG PO TABS
20.0000 mg | ORAL_TABLET | Freq: Every day | ORAL | Status: DC
  Administered 2015-12-09 – 2015-12-11 (×3): 20 mg via ORAL
  Filled 2015-12-08 (×3): qty 1

## 2015-12-08 MED ORDER — WARFARIN SODIUM 2.5 MG PO TABS
2.5000 mg | ORAL_TABLET | Freq: Every day | ORAL | Status: DC
  Administered 2015-12-08 – 2015-12-10 (×3): 2.5 mg via ORAL
  Filled 2015-12-08 (×3): qty 1

## 2015-12-08 NOTE — Evaluation (Signed)
Occupational Therapy Evaluation Patient Details Name: Mitchell Farrell. MRN: [id number] DOB: Jul 04, 1919 Today's Date: 12/08/2015    History of Present Illness 82 y.o. male with PMH of atrial fibrillation on Coumadin, chronic kidney disease-stage III, arthritis, chronic leg weakness, CVA, systolic congestive heart failure, GERD, hypothyroidism, who presents with cough, fever, chills, diarrhea and confusion.  Pt with influenza B.   Clinical Impression   Pt admitted with the above diagnosis and has the deficits listed below. Pt would benefit from cont OT to attempt to reach his baseline level of functioning with adls which was min assist from family.  Family is very supportive but right now pt requires +2 assist for transfers and this may be too much for family to handle at home.  Family is in agreement.  Feel he would benefit from SNF rehab.     Follow Up Recommendations  SNF;Supervision/Assistance - 24 hour    Equipment Recommendations  None recommended by OT    Recommendations for Other Services       Precautions / Restrictions Precautions Precautions: Fall Required Braces or Orthoses: Other Brace/Splint Other Brace/Splint: Pt states he regularly wears a brace on L foot to walk. Restrictions Weight Bearing Restrictions: No      Mobility Bed Mobility Overal bed mobility: Needs Assistance Bed Mobility: Supine to Sit     Supine to sit: Mod assist     General bed mobility comments: Moderate assist to elevate trunk to upright and rotate hips to EOB  Transfers Overall transfer level: Needs assistance Equipment used: Rolling walker (2 wheeled);2 person hand held assist Transfers: Sit to/from [employer] Transfers Sit to Stand: Max assist;+2 physical assistance;From elevated surface Stand pivot transfers: Max assist;+2 physical assistance;From elevated surface       General transfer comment: Attempted pivot transfer with walker and pt unable to remain standing to  pivot.  Pt completed transfer wtih scoot transfer to his R putting arm on chair down to assist pt to chair with max a x1.    Balance Overall balance assessment: Needs assistance;History of Falls Sitting-balance support: Feet supported;Bilateral upper extremity supported Sitting balance-Leahy Scale: Poor Sitting balance - Comments: pt had difficulty sitting on EOB tending to lean to the R at times.   Postural control: Right lateral lean Standing balance support: Bilateral upper extremity supported;During functional activity Standing balance-Leahy Scale: Zero Standing balance comment: pt requires significant outside assist to maintain standing                            ADL Overall ADL's : Needs assistance/impaired Eating/Feeding: Set up;Sitting   Grooming: Wash/dry hands;Wash/dry face;Oral care;Set up;Sitting Grooming Details (indicate cue type and reason): pt unable to stand at this time to groom Upper Body Bathing: Minimal assitance;Sitting   Lower Body Bathing: Maximal assistance;Sit to/from stand Lower Body Bathing Details (indicate cue type and reason): max assist to reach feet  Upper Body Dressing : Minimal assistance;Sitting   Lower Body Dressing: Maximal assistance;Sit to/from stand Lower Body Dressing Details (indicate cue type and reason): Pt requires a great amount of assist to stand and pull pants up and also cannot start pants over feet etc.  Pt has required assist for socks and shoes for some time now. Toilet Transfer: Maximal Patent examiner Details (indicate cue type and reason): squat pivot to his R side. Toileting- Clothing Manipulation and Hygiene: Total assistance;Sitting/lateral lean Toileting - Clothing Manipulation Details (indicate cue type and reason): Pt unable to  stand long enough to do toileting in standing.     Functional mobility during ADLs: Maximal assistance;Rolling walker General ADL Comments: Pt requires  increased assist with adls due to illness.  Family may not be able to assist this pt at home at his current level.     Vision Vision Assessment?: No apparent visual deficits   Perception     Praxis      Pertinent Vitals/Pain Pain Assessment: Faces Faces Pain Scale: Hurts little more Pain Location: back Pain Descriptors / Indicators: Aching Pain Intervention(s): Monitored during session;Repositioned;Limited activity within patient's tolerance     Hand Dominance Right   Extremity/Trunk Assessment Upper Extremity Assessment Upper Extremity Assessment: Generalized weakness   Lower Extremity Assessment Lower Extremity Assessment: Defer to PT evaluation LLE Deficits / Details: residual CVA deficits noted, strength 3/5   Cervical / Trunk Assessment Cervical / Trunk Assessment: Kyphotic   Communication Communication Communication: HOH   Cognition Arousal/Alertness: Awake/alert Behavior During Therapy: WFL for tasks assessed/performed Overall Cognitive Status: History of cognitive impairments - at baseline                     General Comments       Exercises       Shoulder Instructions      Home Living Family/patient expects to be discharged to:: Private residence Living Arrangements: Children Available Help at Discharge: Family Type of Home: House Home Access: Ramped entrance     Home Layout: Two level;Able to live on main level with bedroom/bathroom     Bathroom Shower/Tub: Tub/shower unit;Curtain Shower/tub characteristics: Architectural technologist: Standard     Home Equipment: Environmental consultant - 2 wheels;Wheelchair - manual;Bedside commode;Grab bars - tub/shower;Hand held shower head;Tub bench          Prior Functioning/Environment Level of Independence: Needs assistance  Gait / Transfers Assistance Needed: Pt states he spends most of his time in a w/c.  He was (two weeks ago) able to walk short distances with someone assisting or following with a chair.    ADL's / Homemaking Assistance Needed: Pt has assist for all homemaking, cleaning and cooking.  Pt had assist daily for donning socks and shoes and to stand to pull pants up. Pt was able to transfer with very little outside assist.        OT Diagnosis: Generalized weakness;Cognitive deficits   OT Problem List: Decreased strength;Decreased activity tolerance;Impaired balance (sitting and/or standing);Decreased cognition;Decreased safety awareness;Decreased knowledge of use of DME or AE;Decreased knowledge of precautions;Pain   OT Treatment/Interventions: Self-care/ADL training;Therapeutic activities;DME and/or AE instruction    OT Goals(Current goals can be found in the care plan section) Acute Rehab OT Goals Patient Stated Goal: to go home OT Goal Formulation: With patient/family Time For Goal Achievement: 12/22/15 Potential to Achieve Goals: Fair ADL Goals Pt Will Perform Upper Body Bathing: with set-up;sitting Pt Will Perform Lower Body Bathing: with mod assist;sit to/from stand Pt Will Perform Lower Body Dressing: with mod assist;with adaptive equipment;sit to/from stand Pt Will Transfer to Toilet: with min assist;stand pivot transfer;bedside commode Pt Will Perform Tub/Shower Transfer: Tub transfer;with mod assist;tub bench;rolling walker  OT Frequency: Min 2X/week   Barriers to D/C: Decreased caregiver support  Pt has a very supportive family but feel at this level he may need SNF before returning home.       Co-evaluation              End of Session Nurse Communication: Mobility status  Activity Tolerance: Patient limited  by fatigue Patient left: in chair;with call bell/phone within reach;with chair alarm set;with family/visitor present   Time: 1200-1226 OT Time Calculation (min): 26 min Charges:  OT General Charges $OT Visit: 1 Procedure OT Evaluation $OT Eval Moderate Complexity: 1 Procedure OT Treatments $Self Care/Home Management : 8-22 mins G-Codes:     Glenford Peers 01/02/16, 12:43 PM  [phone]

## 2015-12-08 NOTE — Progress Notes (Signed)
Enteric precautions, C. Diff, and GI panel d/c'ed per Director of Nursing. Pt without BM for 48 hours. Will continue to monitor.

## 2015-12-08 NOTE — Care Management Note (Signed)
Case Management Note  Patient Details  Name: Mitchell Farrell. MRN: [id number] Date of Birth: [birth year]  Subjective/Objective:82 y.o M admitted 2/27 with Sepsis, and Acute Encephalopathy which has returned to baseline. Hx CHF, CAD, CAF. PT evaluation recommending STSNF as pt was 2+ Assist while ambulating.                   Action/Plan: Referred to CSW Josie for SNF placement. Pt has [place] Medicare and will need 3 MNs for SNF placement. Will continue to follow.    Expected Discharge Date:                  Expected Discharge Plan:  Skilled Nursing Facility  In-House Referral:  Clinical Social Work  Discharge planning Services  CM Consult  Post Acute Care Choice:    Choice offered to:     DME Arranged:    DME Agency:     HH Arranged:    [place] Agency:     Status of Service:  In process, will continue to follow  Medicare Important Message Given:    Date Medicare IM Given:    Medicare IM give by:    Date Additional Medicare IM Given:    Additional Medicare Important Message give by:     If discussed at [place] of Stay Meetings, dates discussed:    Additional Comments:  Delrae Sawyers, RN 12/08/2015, 11:02 AM

## 2015-12-08 NOTE — Progress Notes (Signed)
[facility] PROGRESS NOTE  Mitchell Farrell. [id number]:[id number] DOB: [birth year] DOA: 12/06/2015 PCP: Mitchell Stain, MD  Assessment/Plan: [name]. is a 82 y.o. male with PMH of atrial fibrillation on Coumadin, chronic kidney disease-stage III, arthritis, chronic leg weakness, CVA, systolic congestive heart failure, GERD, hypothyroidism, who presents with cough, fever, chills, diarrhea and confusion.  Per his daughter, patient is actively functioning at baseline. He has been having dry cough, fever, chills in the past 3 days. He also has runny nose and dry throat. Patient does not have chest pain or shortness breath. He has generalized weakness, and mild confusion. Daughter also reports that patient has 3 -day diarrhea in the last week, which improved gradually. He had 2 loose bowel movement today. Patient does not have nausea, vomiting, abdominal pain. Per daughter, patient does not have symptoms of UTI or unilateral weakness.  Sepsis [facility]): Patient meets criteria for sepsis with fever and tachycardia. Lactate was normal. Hemodynamically stable. Initially the source of infection was not clear, patient was started with vancomycin and Zosyn by EDP.  Flu pcr comes back positive for flu B. Therefore the most likely source of infection is Flu B. -Continue Tamiflu -Discontinue vanco and zosyn  Influenza B -On Tamiflu  Diarrhea: Likely due to flu B, but need to rule out other possibilities, such as C. difficile colitis. He was given 1 dose of Flagyl before flu PCR comes back. Diarrhea resolved.  -IV fluid as above -When necessary Zofran for nausea  Acute encephalopathy: Patient has mild confusion. Likely due to multifactorial etiologies, including sepsis, flu, worsening renal function and dehydration. -Treat underlying problems -improved.   GERD: -will switch PPI to pepcid IV until C diff pcr negative  Atrial Fibrillation: CHA2DS2-VASc Score is 4, needs oral anticoagulation. Patient is  on Coumadin. INR is 1.74 on admission. Heart rate is ~110. -continue coumadin per pharmacy -Continue Coreg  Hypothyroidism: Last TSH was 1.30 on 09/20/15 -Continue home Synthroid  Acute renal failure superimposed on stage 3 chronic kidney disease ([place]): Mildly worsened kidney function. Baseline creatinine 1.3-1.6, his creatinine is 1.75, BUN 17 on admission. Likely due to prerenal secondary to dehydration and continuation of diruetics, NSAIDs. - IVF as above -improving.  - Hold Lasix and naproxen  Systolic CHF [facility]): Patient used to have a systolic congestive heart failure. 2-D echo on 02/07/13 showed EF 30-35 percent. Recent 2-D echo 02/19/14 showed EF 55-60 percent. . Lung is clear, does not seem to have acute exacerbation. -will hold Lasix due to sepsis -Continue Coreg -patient on coumadin, INR therapeutic.   Code Status: Full code.  Family Communication: care discussed with daughter who was at bedside.  Disposition Plan: remain inpatient    Consultants:  none  Procedures:  none  Antibiotics:  tamiflu  HPI/Subjective: Feeling well, eating better   Objective: Filed Vitals:   12/08/15 0321 12/08/15 1305  BP: 128/59 144/73  Pulse: 49 46  Temp: 98.9 F (37.2 C) 99.1 F (37.3 C)  Resp: 16 18    Intake/Output Summary (Last 24 hours) at 12/08/15 1604 Last data filed at 12/08/15 1409  Gross per 24 hour  Intake 2016.25 ml  Output   1025 ml  Net 991.25 ml   Filed Weights   12/06/15 2014 12/07/15 0143 12/08/15 0321  Weight: 77.111 kg (170 lb) 80.468 kg (177 lb 6.4 oz) 80.74 kg (178 lb)    Exam:   General:  NAD  Cardiovascular: S 1, S 2 RRR  Respiratory: decrease breath sound.   Abdomen:  bs present, soft, nt  Musculoskeletal: no edema   Data Reviewed: Basic Metabolic Panel:  Recent Labs Lab 12/06/15 2041 12/07/15 0220 12/08/15 0458  NA 138 140 141  K 4.7 4.2 4.1  CL 106 108 110  CO2 19* 21* 21*  GLUCOSE 105* 109* 85  BUN 17 17 17   CREATININE  1.75* 1.74* 1.57*  CALCIUM 8.6* 8.0* 8.0*   Liver Function Tests:  Recent Labs Lab 12/06/15 2041  AST 42*  ALT 17  ALKPHOS 79  BILITOT 1.2  PROT 7.8  ALBUMIN 3.2*   No results for input(s): LIPASE, AMYLASE in the last 168 hours. No results for input(s): AMMONIA in the last 168 hours. CBC:  Recent Labs Lab 12/06/15 2041 12/07/15 0220 12/08/15 0458  WBC 6.1 6.4 4.2  NEUTROABS 5.0  --   --   HGB 13.5 12.2* 10.7*  HCT 43.0 39.6 34.7*  MCV 86.2 86.3 85.9  PLT 144* 145* 123*   Cardiac Enzymes: No results for input(s): CKTOTAL, CKMB, CKMBINDEX, TROPONINI in the last 168 hours. BNP (last 3 results)  Recent Labs  12/07/15 0220  BNP 252.9*    ProBNP (last 3 results) No results for input(s): PROBNP in the last 8760 hours.  CBG:  Recent Labs Lab 12/08/15 0619  GLUCAP 89    Recent Results (from the past 240 hour(s))  Blood Culture (routine x 2)     Status: None (Preliminary result)   Collection Time: 12/06/15  8:30 PM  Result Value Ref Range Status   Specimen Description BLOOD LEFT HAND  Final   Special Requests IN PEDIATRIC BOTTLE 4CC  Final   Culture NO GROWTH 2 DAYS  Final   Report Status PENDING  Incomplete  Blood Culture (routine x 2)     Status: None (Preliminary result)   Collection Time: 12/06/15  8:35 PM  Result Value Ref Range Status   Specimen Description BLOOD RIGHT HAND  Final   Special Requests IN PEDIATRIC BOTTLE 2CC  Final   Culture NO GROWTH 2 DAYS  Final   Report Status PENDING  Incomplete  Urine culture     Status: None   Collection Time: 12/06/15  9:20 PM  Result Value Ref Range Status   Specimen Description URINE, RANDOM  Final   Special Requests NONE  Final   Culture NO GROWTH 2 DAYS  Final   Report Status 12/08/2015 FINAL  Final     Studies: Dg Chest Port 1 View  12/06/2015  CLINICAL DATA:  Onset today general weakness inability to stand by himself, cough, chills, loss of appetite, and confusion stated by daughter. hx: afib, CKD  EXAM: PORTABLE CHEST 1 VIEW COMPARISON:  02/19/2014 FINDINGS: Midline trachea. Normal heart size. Right paratracheal soft tissue fullness is similar to the 2015 exam and likely due to prominent great vessels. Right hemidiaphragm elevation. No pleural effusion or pneumothorax. Suspect areas of right greater the left scarring with similar nonspecific lower lobe predominant interstitial prominence. No superimposed consolidation. No congestive failure. IMPRESSION: No acute cardiopulmonary disease. Electronically Signed   By: Abigail Miyamoto M.D.   On: 12/06/2015 21:02    Scheduled Meds: . carvedilol  3.125 mg Oral BID WC  . [START ON 12/09/2015] famotidine  20 mg Oral Daily  . gabapentin  100-200 mg Oral QHS  . guaiFENesin  600 mg Oral BID  . levothyroxine  88 mcg Oral QAC breakfast  . oseltamivir  30 mg Oral Daily  . sodium chloride flush  3 mL Intravenous Q12H  .  warfarin  2.5 mg Oral q1800  . Warfarin - Pharmacist Dosing Inpatient   Does not apply q1800   Continuous Infusions: . sodium chloride 75 mL/hr at 12/07/15 0316    Principal Problem:   Influenza B Active Problems:   AF (atrial fibrillation) (HCC)   Hypothyroidism   Warfarin anticoagulation   Acute renal failure superimposed on stage 3 chronic kidney disease (HCC)   PAD (peripheral artery disease) (HCC)   Systolic CHF ([place])   Acute CVA (cerebrovascular accident) ([place])   GERD (gastroesophageal reflux disease)   Sepsis (HCC)   Cough   Diarrhea    Time spent: 35 minutes.     Niel Hummer A  [facility] Pager [phone]. If 7PM-7AM, please contact night-coverage at [email or web address], password [facility] 12/08/2015, 4:04 PM  LOS: 1 day

## 2015-12-08 NOTE — Progress Notes (Signed)
ANTICOAGULATION CONSULT NOTE - Follow Up Consult  Pharmacy Consult for Coumadin Indication: atrial fibrillation  No Known Allergies  Patient Measurements: Height: 5\' 6"  (167.6 cm) Weight: 178 lb (80.74 kg) (bedscale ) IBW/kg (Calculated) : 63.8  Vital Signs: Temp: 98.9 F (37.2 C) (03/01 0321) Temp Source: Oral (03/01 0321) BP: 128/59 mmHg (03/01 0321) Pulse Rate: 49 (03/01 0321)  Labs:  Recent Labs  12/06/15 2041 12/07/15 0220 12/08/15 0458  HGB 13.5 12.2* 10.7*  HCT 43.0 39.6 34.7*  PLT 144* 145* 123*  APTT 25  --   --   LABPROT 20.3* 22.8* 24.1*  INR 1.74* 2.03* 2.18*  CREATININE 1.75* 1.74* 1.57*    Estimated Creatinine Clearance: 28.7 mL/min (by C-G formula based on Cr of 1.57).  Assessment:   On Coumadin prior to admission for afib and hx CVA.   INR is 2.18 today, low therapeutic.  Had 1 dose of Flagyl on 2/28.   Home Coumadin regimen: 2.5 mg daily.  INR 1.74 on admit 2/27.   Hgb has trended down, platelet count low, has also trended down some.  No bleeding noted.  Goal of Therapy:  INR 2-3 Monitor platelets by anticoagulation protocol: Yes   Plan:   Resume Coumadin 2.5 mg daily.  Daily PT/INR for now.  Intermittent CBC, will recheck in am. Watch platelet count, follow for any bleeding.  Pepcid 20 mg IV q12h changed to 20 mg PO daily since taking POs and adjusted for renal function.  Arty Baumgartner, [place] Pager: [phone] 12/08/2015,11:11 AM

## 2015-12-09 ENCOUNTER — Inpatient Hospital Stay (HOSPITAL_COMMUNITY): Payer: MEDICARE

## 2015-12-09 DIAGNOSIS — N183 Chronic kidney disease, stage 3 (moderate): Secondary | ICD-10-CM

## 2015-12-09 DIAGNOSIS — N179 Acute kidney failure, unspecified: Secondary | ICD-10-CM

## 2015-12-09 DIAGNOSIS — R600 Localized edema: Secondary | ICD-10-CM

## 2015-12-09 LAB — CBC
HEMATOCRIT: 35.7 % — AB (ref 39.0–52.0)
HEMOGLOBIN: 11.1 g/dL — AB (ref 13.0–17.0)
MCH: 26.6 pg (ref 26.0–34.0)
MCHC: 31.1 g/dL (ref 30.0–36.0)
MCV: 85.4 fL (ref 78.0–100.0)
Platelets: 131 10*3/uL — ABNORMAL LOW (ref 150–400)
RBC: 4.18 MIL/uL — ABNORMAL LOW (ref 4.22–5.81)
RDW: 15.5 % (ref 11.5–15.5)
WBC: 4.8 10*3/uL (ref 4.0–10.5)

## 2015-12-09 LAB — BASIC METABOLIC PANEL
ANION GAP: 7 (ref 5–15)
BUN: 16 mg/dL (ref 6–20)
CHLORIDE: 112 mmol/L — AB (ref 101–111)
CO2: 19 mmol/L — AB (ref 22–32)
CREATININE: 1.42 mg/dL — AB (ref 0.61–1.24)
Calcium: 7.8 mg/dL — ABNORMAL LOW (ref 8.9–10.3)
GFR calc non Af Amer: 41 mL/min — ABNORMAL LOW (ref >60)
GFR, EST AFRICAN AMERICAN: 47 mL/min — AB (ref >60)
Glucose, Bld: 88 mg/dL (ref 65–99)
Potassium: 3.9 mmol/L (ref 3.5–5.1)
Sodium: 138 mmol/L (ref 135–145)

## 2015-12-09 LAB — PROTIME-INR
INR: 2.41 — AB (ref 0.00–1.49)
Prothrombin Time: 25.9 seconds — ABNORMAL HIGH (ref 11.6–15.2)

## 2015-12-09 LAB — GLUCOSE, CAPILLARY: Glucose-Capillary: 78 mg/dL (ref 65–99)

## 2015-12-09 MED ORDER — OSELTAMIVIR PHOSPHATE 30 MG PO CAPS
30.0000 mg | ORAL_CAPSULE | Freq: Two times a day (BID) | ORAL | Status: DC
  Administered 2015-12-09 – 2015-12-11 (×5): 30 mg via ORAL
  Filled 2015-12-09 (×8): qty 1

## 2015-12-09 NOTE — Progress Notes (Signed)
VASCULAR LAB PRELIMINARY  PRELIMINARY  PRELIMINARY  PRELIMINARY  Right lower extremity venous duplex completed.    Preliminary report:  Right:  No evidence of DVT, superficial thrombosis, or Baker's cyst.  Jeslyn Amsler, RVT 12/09/2015, 9:53 AM

## 2015-12-09 NOTE — Progress Notes (Signed)
[facility] PROGRESS NOTE  Mitchell Farrell. [id number]:[id number] DOB: 10-Dec-1918 DOA: 12/06/2015 PCP: Elsie Stain, MD  Assessment/Plan: Mitchell Farrell. is a 82 y.o. male with PMH of atrial fibrillation on Coumadin, chronic kidney disease-stage III, arthritis, chronic leg weakness, CVA, systolic congestive heart failure, GERD, hypothyroidism, who presents with cough, fever, chills, diarrhea and confusion.  Per his daughter, patient is actively functioning at baseline. He has been having dry cough, fever, chills in the past 3 days. He also has runny nose and dry throat. Patient does not have chest pain or shortness breath. He has generalized weakness, and mild confusion. Daughter also reports that patient has 3 -day diarrhea in the last week, which improved gradually. He had 2 loose bowel movement today. Patient does not have nausea, vomiting, abdominal pain. Per daughter, patient does not have symptoms of UTI or unilateral weakness.  Sepsis [facility]): Patient meets criteria for sepsis with fever and tachycardia. Lactate was normal. Hemodynamically stable. Initially the source of infection was not clear, patient was started with vancomycin and Zosyn by EDP.  Flu pcr comes back positive for flu B. Therefore the most likely source of infection is Flu B. -Continue Tamiflu -Discontinue vanco and zosyn  Influenza B -On Tamiflu  Diarrhea: Likely due to flu B, but need to rule out other possibilities, such as C. difficile colitis. He was given 1 dose of Flagyl before flu PCR comes back. Diarrhea resolved.  -IV fluid as above -When necessary Zofran for nausea  Acute encephalopathy: Patient has mild confusion. Likely due to multifactorial etiologies, including sepsis, flu, worsening renal function and dehydration. -Treat underlying problems -improved.   GERD: -will switch PPI to pepcid IV until C diff pcr negative  Atrial Fibrillation: CHA2DS2-VASc Score is 4, needs oral anticoagulation. Patient is  on Coumadin. INR is 1.74 on admission. Heart rate is ~110. -continue coumadin per pharmacy -Continue Coreg  Hypothyroidism: Last TSH was 1.30 on 09/20/15 -Continue home Synthroid  Acute renal failure superimposed on stage 3 chronic kidney disease ([place]): Mildly worsened kidney function. Baseline creatinine 1.3-1.6, his creatinine is 1.75, BUN 17 on admission. Likely due to prerenal secondary to dehydration and continuation of diruetics, NSAIDs. - NSL fluid.  -improving.  - Hold Lasix and naproxen Might be able to resume lasix at discharge   Systolic CHF [facility]): Patient used to have a systolic congestive heart failure. 2-D echo on 02/07/13 showed EF 30-35 percent. Recent 2-D echo 02/19/14 showed EF 55-60 percent. . Lung is clear, does not seem to have acute exacerbation. -will hold Lasix due to sepsis -Continue Coreg -patient on coumadin, INR therapeutic.   Code Status: Full code.  Family Communication: care discussed with daughter who was at bedside.  Disposition Plan: remain inpatient    Consultants:  none  Procedures:  none  Antibiotics:  tamiflu  HPI/Subjective: Feeling well, eating better , no diarrhea.   Objective: Filed Vitals:   12/09/15 0559 12/09/15 1222  BP: 152/75 144/69  Pulse: 49 50  Temp: 98 F (36.7 C) 98 F (36.7 C)  Resp: 16 18    Intake/Output Summary (Last 24 hours) at 12/09/15 1608 Last data filed at 12/09/15 1556  Gross per 24 hour  Intake    840 ml  Output   1825 ml  Net   -985 ml   Filed Weights   12/07/15 0143 12/08/15 0321 12/09/15 0559  Weight: 80.468 kg (177 lb 6.4 oz) 80.74 kg (178 lb) 81.058 kg (178 lb 11.2 oz)    Exam:  General:  NAD  Cardiovascular: S 1, S 2 RRR  Respiratory: decrease breath sound.   Abdomen: bs present, soft, nt  Musculoskeletal: no edema   Data Reviewed: Basic Metabolic Panel:  Recent Labs Lab 12/06/15 2041 12/07/15 0220 12/08/15 0458 12/09/15 0448  NA 138 140 141 138  K 4.7 4.2 4.1 3.9   CL 106 108 110 112*  CO2 19* 21* 21* 19*  GLUCOSE 105* 109* 85 88  BUN 17 17 17 16   CREATININE 1.75* 1.74* 1.57* 1.42*  CALCIUM 8.6* 8.0* 8.0* 7.8*   Liver Function Tests:  Recent Labs Lab 12/06/15 2041  AST 42*  ALT 17  ALKPHOS 79  BILITOT 1.2  PROT 7.8  ALBUMIN 3.2*   No results for input(s): LIPASE, AMYLASE in the last 168 hours. No results for input(s): AMMONIA in the last 168 hours. CBC:  Recent Labs Lab 12/06/15 2041 12/07/15 0220 12/08/15 0458 12/09/15 0448  WBC 6.1 6.4 4.2 4.8  NEUTROABS 5.0  --   --   --   HGB 13.5 12.2* 10.7* 11.1*  HCT 43.0 39.6 34.7* 35.7*  MCV 86.2 86.3 85.9 85.4  PLT 144* 145* 123* 131*   Cardiac Enzymes: No results for input(s): CKTOTAL, CKMB, CKMBINDEX, TROPONINI in the last 168 hours. BNP (last 3 results)  Recent Labs  12/07/15 0220  BNP 252.9*    ProBNP (last 3 results) No results for input(s): PROBNP in the last 8760 hours.  CBG:  Recent Labs Lab 12/08/15 0619 12/09/15 0655  GLUCAP 89 78    Recent Results (from the past 240 hour(s))  Blood Culture (routine x 2)     Status: None (Preliminary result)   Collection Time: 12/06/15  8:30 PM  Result Value Ref Range Status   Specimen Description BLOOD LEFT HAND  Final   Special Requests IN PEDIATRIC BOTTLE 4CC  Final   Culture NO GROWTH 3 DAYS  Final   Report Status PENDING  Incomplete  Blood Culture (routine x 2)     Status: None (Preliminary result)   Collection Time: 12/06/15  8:35 PM  Result Value Ref Range Status   Specimen Description BLOOD RIGHT HAND  Final   Special Requests IN PEDIATRIC BOTTLE 2CC  Final   Culture NO GROWTH 3 DAYS  Final   Report Status PENDING  Incomplete  Urine culture     Status: None   Collection Time: 12/06/15  9:20 PM  Result Value Ref Range Status   Specimen Description URINE, RANDOM  Final   Special Requests NONE  Final   Culture NO GROWTH 2 DAYS  Final   Report Status 12/08/2015 FINAL  Final  Respiratory virus panel      Status: Abnormal   Collection Time: 12/07/15  8:02 AM  Result Value Ref Range Status   Respiratory Syncytial Virus A Negative Negative Final   Respiratory Syncytial Virus B Negative Negative Final   Influenza A Negative Negative Final   Influenza B Positive (A) Negative Final   Parainfluenza 1 Negative Negative Final   Parainfluenza 2 Negative Negative Final   Parainfluenza 3 Negative Negative Final   Metapneumovirus Negative Negative Final   Rhinovirus Negative Negative Final   Adenovirus Negative Negative Final    Comment: (NOTE) Performed At: [facility] [place], [place] [id number] Lindon Romp MD [id number]      Studies: No results found.  Scheduled Meds: . carvedilol  3.125 mg Oral BID WC  . famotidine  20 mg Oral Daily  .  gabapentin  100-200 mg Oral QHS  . guaiFENesin  600 mg Oral BID  . levothyroxine  88 mcg Oral QAC breakfast  . oseltamivir  30 mg Oral BID  . sodium chloride flush  3 mL Intravenous Q12H  . warfarin  2.5 mg Oral q1800  . Warfarin - Pharmacist Dosing Inpatient   Does not apply q1800   Continuous Infusions:    Principal Problem:   Influenza B Active Problems:   AF (atrial fibrillation) (HCC)   Hypothyroidism   Warfarin anticoagulation   Acute renal failure superimposed on stage 3 chronic kidney disease (HCC)   PAD (peripheral artery disease) (HCC)   Systolic CHF ([place])   Acute CVA (cerebrovascular accident) ([place])   GERD (gastroesophageal reflux disease)   Sepsis (HCC)   Cough   Diarrhea    Time spent: 35 minutes.     Niel Hummer A  [facility] Pager [phone]. If 7PM-7AM, please contact night-coverage at [email or web address], password [facility] 12/09/2015, 4:08 PM  LOS: 2 days

## 2015-12-09 NOTE — Progress Notes (Signed)
ANTICOAGULATION CONSULT NOTE - Follow Up Consult  Pharmacy Consult for Coumadin Indication: atrial fibrillation  No Known Allergies  Patient Measurements: Height: 5\' 6"  (167.6 cm) Weight: 178 lb 11.2 oz (81.058 kg) (bedscale) IBW/kg (Calculated) : 63.8  Vital Signs: Temp: 98 F (36.7 C) (03/02 0559) Temp Source: Oral (03/02 0559) BP: 152/75 mmHg (03/02 0559) Pulse Rate: 49 (03/02 0559)  Labs:  Recent Labs  12/06/15 2041 12/07/15 0220 12/08/15 0458 12/09/15 0448  HGB 13.5 12.2* 10.7* 11.1*  HCT 43.0 39.6 34.7* 35.7*  PLT 144* 145* 123* 131*  APTT 25  --   --   --   LABPROT 20.3* 22.8* 24.1* 25.9*  INR 1.74* 2.03* 2.18* 2.41*  CREATININE 1.75* 1.74* 1.57* 1.42*    Estimated Creatinine Clearance: 31.8 mL/min (by C-G formula based on Cr of 1.42).  Assessment:   On Coumadin prior to admission for afib and hx CVA.   INR is 2.41 today, therapeutic.  Had 1 dose of Flagyl on 2/28.   Home Coumadin regimen: 2.5 mg daily.  INR 1.74 on admit 2/27.   Hgb had trended down, platelet count low, no further drop today.  No bleeding noted.   Day # 3 of 5 Tamiflu for + Influenza B.  Creatinine has trended down, crcl now >30 ml/min.  Goal of Therapy:  INR 2-3 Monitor platelets by anticoagulation protocol: Yes   Plan:   Continue Coumadin 2.5 mg daily.  Daily PT/INR for now.  Intermittent CBC. Watch platelet count, follow for any bleeding.  Adjusted Tamiflu 30 mg daily to 30 mg BID for crcl 30-60 ml/min.  Arty Baumgartner, [place] Pager: [phone] 12/09/2015,10:01 AM

## 2015-12-09 NOTE — Discharge Instructions (Signed)

## 2015-12-10 LAB — PROTIME-INR
INR: 2.51 — AB (ref 0.00–1.49)
PROTHROMBIN TIME: 26.8 s — AB (ref 11.6–15.2)

## 2015-12-10 LAB — GLUCOSE, CAPILLARY: Glucose-Capillary: 83 mg/dL (ref 65–99)

## 2015-12-10 MED ORDER — WARFARIN SODIUM 5 MG PO TABS: 2.5000 mg | ORAL_TABLET | Freq: Every day | ORAL | Status: DC

## 2015-12-10 MED ORDER — OSELTAMIVIR PHOSPHATE 30 MG PO CAPS: 30.0000 mg | ORAL_CAPSULE | Freq: Two times a day (BID) | ORAL | Status: DC

## 2015-12-10 MED ORDER — FUROSEMIDE 20 MG PO TABS: 20.0000 mg | ORAL_TABLET | Freq: Every day | ORAL | Status: DC

## 2015-12-10 NOTE — NC FL2 (Signed)
[place] LEVEL OF CARE SCREENING TOOL     IDENTIFICATION  Patient Name: Mitchell Farrell. Birthdate: 1919/09/28 Sex: male Admission Date (Current Location): 12/06/2015  [facility] and [place] Number:  Herbalist and Address:  [facility] [place], [place] [address] [place], [place] [address]      Provider Number: [id number]  Attending Physician Name and Address:  Elmarie Shiley, MD  Relative Name and Phone Number:  Daughter [name] [phone]    Current Level of Care: Hospital Recommended Level of Care: [place] Prior Approval Number:    Date Approved/Denied:   PASRR Number: [id number] (Effective 02/24/14)  Discharge Plan: SNF    Current Diagnoses: Patient Active Problem List   Diagnosis Date Noted  . GERD (gastroesophageal reflux disease) 12/07/2015  . Sepsis ([place]) 12/07/2015  . Cough 12/07/2015  . Diarrhea 12/07/2015  . Influenza B 12/07/2015  . Leg edema   . Edema 04/01/2015  . Acute CVA (cerebrovascular accident) ([place]) 02/18/2014  . Encounter for therapeutic drug monitoring 12/01/2013  . Systolic CHF ([place]) [id number]  . PAD (peripheral artery disease) ([place]) 02/06/2013  . LFT elevation 09/23/2012  . Back pain 03/22/2012  . Foot pain 12/22/2011  . Skin breakdown 12/22/2011  . Itching 09/29/2011  . Numbness and tingling in left hand 07/04/2011  . Dry mouth 07/04/2011  . Abdominal wall pain 07/04/2011  . Esophagitis 06/05/2011  . Acute renal failure superimposed on stage 3 chronic kidney disease ([place]) 06/05/2011  . Colon cancer screening 03/26/2011  . Prostate cancer screening 03/26/2011  . Advance directive discussed with patient 03/26/2011  . AF (atrial fibrillation) ([place]) 01/03/2011  . OA (osteoarthritis) 01/03/2011  . Weakness of left leg 01/03/2011  . Hypothyroidism 01/03/2011  . Warfarin anticoagulation 01/03/2011    Orientation RESPIRATION BLADDER Height & Weight     Self, Time,  Situation, Place  Normal Incontinent, External catheter Weight: 177 lb 3.2 oz (80.377 kg) Height:  5\' 6"  (167.6 cm)  BEHAVIORAL SYMPTOMS/MOOD NEUROLOGICAL BOWEL NUTRITION STATUS      Incontinent Diet (Low sodium - Heart healthy)  AMBULATORY STATUS COMMUNICATION OF NEEDS Skin   Total Care (Patient unable to ambulate with physical therapy) Verbally Normal                       Personal Care Assistance Level of Assistance  Bathing, Feeding, Dressing Bathing Assistance: Maximum assistance Feeding assistance: Independent Dressing Assistance: Maximum assistance     Functional Limitations Info  Sight, Hearing, Speech Sight Info: Impaired (Wears glasses) Hearing Info: Impaired (Patient has some hearing loss per daughter. Does not wear a hearing aid.) Speech Info: Adequate    SPECIAL CARE FACTORS FREQUENCY  PT (By licensed PT), OT (By licensed OT)     PT Frequency: Evaluated 12/07/15 and a minimum of 2X per week therapy recommended OT Frequency: Evaluated 12/08/15 and a minimum of 2X per week therapy recommended            Contractures Contractures Info: Not present    Additional Factors Info  Code Status, Allergies, Isolation Precautions Code Status Info: Full Code Allergies Info: Patient on isolation precautions     Isolation Precautions Info: Patient on droplet precautions     Current Medications (12/10/2015):  This is the current hospital active medication list Current Facility-Administered Medications  Medication Dose Route Frequency Provider Last Rate Last Dose  . acetaminophen (TYLENOL) tablet 650 mg  650 mg Oral Q6H PRN Soledad Gerlach  Blaine Hamper, MD       Or  . acetaminophen (TYLENOL) suppository 650 mg  650 mg Rectal Q6H PRN Ivor Costa, MD      . carvedilol (COREG) tablet 3.125 mg  3.125 mg Oral BID WC Belkys A Regalado, MD   3.125 mg at 12/08/15 1700  . famotidine (PEPCID) tablet 20 mg  20 mg Oral Daily Skeet Simmer, RPH   20 mg at 12/10/15 0945  . gabapentin (NEURONTIN) capsule  100-200 mg  100-200 mg Oral QHS Ivor Costa, MD   100 mg at 12/09/15 2116  . guaiFENesin (MUCINEX) 12 hr tablet 600 mg  600 mg Oral BID Ivor Costa, MD   600 mg at 12/10/15 0945  . levothyroxine (SYNTHROID, LEVOTHROID) tablet 88 mcg  88 mcg Oral QAC breakfast Ivor Costa, MD   88 mcg at 12/10/15 0532  . ondansetron (ZOFRAN) tablet 4 mg  4 mg Oral Q6H PRN Ivor Costa, MD       Or  . ondansetron [facility]) injection 4 mg  4 mg Intravenous Q6H PRN Ivor Costa, MD      . oseltamivir (TAMIFLU) capsule 30 mg  30 mg Oral BID Skeet Simmer, RPH   30 mg at 12/10/15 0945  . polyvinyl alcohol (LIQUIFILM TEARS) 1.4 % ophthalmic solution 1 drop  1 drop Both Eyes PRN Ivor Costa, MD      . sodium chloride flush (NS) 0.9 % injection 3 mL  3 mL Intravenous Q12H Ivor Costa, MD   3 mL at 12/10/15 1000  . warfarin (COUMADIN) tablet 2.5 mg  2.5 mg Oral q1800 Skeet Simmer, RPH   2.5 mg at 12/10/15 1855  . Warfarin - Pharmacist Dosing Inpatient   Does not apply [place], [facility]         Discharge Medications: Please see discharge summary for a list of discharge medications.  Relevant Imaging Results:  Relevant Lab Results:   Additional Information SS# [id number]  Sable Feil, LCSW

## 2015-12-10 NOTE — Progress Notes (Signed)
Physical Therapy Treatment Patient Details Name: Mitchell Farrell. MRN: [id number] DOB: Mar 22, 1919 Today's Date: 12/10/2015    History of Present Illness 82 y.o. male with PMH of atrial fibrillation on Coumadin, chronic kidney disease-stage III, arthritis, chronic leg weakness, CVA, systolic congestive heart failure, GERD, hypothyroidism, who presents with cough, fever, chills, diarrhea and confusion.  Pt with influenza B.    PT Comments    Pt has been agreeable to transfers and there ex but cannot take steps as he is confused about managing the walker.  Still planning SNF as his wife will not be able to care for him at home yet.  Continue on with strengthening and taking steps as he is able.  Follow Up Recommendations  SNF     Equipment Recommendations  None recommended by PT    Recommendations for Other Services Rehab consult     Precautions / Restrictions Precautions Precautions: Fall Restrictions Weight Bearing Restrictions: No    Mobility  Bed Mobility Overal bed mobility: Needs Assistance Bed Mobility: Supine to Sit     Supine to sit: Mod assist     General bed mobility comments: help to scoot to EOB and to slide legs  Transfers Overall transfer level: Needs assistance Equipment used: Rolling walker (2 wheeled);1 person hand held assist Transfers: Sit to/from [employer] Transfers Sit to Stand: Max assist;+2 physical assistance;+2 safety/equipment Stand pivot transfers: Max assist;+2 physical assistance;+2 safety/equipment       General transfer comment: Had to assist to stand without walker as pt cannot cognitively handle the task  Ambulation/Gait             General Gait Details: unable   Stairs            Wheelchair Mobility    Modified Rankin (Stroke Patients Only)       Balance Overall balance assessment: Needs assistance Sitting-balance support: Feet supported;Bilateral upper extremity supported Sitting balance-Leahy Scale:  Fair       Standing balance-Leahy Scale: Zero                      Cognition Arousal/Alertness: Awake/alert Behavior During Therapy: WFL for tasks assessed/performed Overall Cognitive Status: History of cognitive impairments - at baseline       Memory: Decreased short-term memory              Exercises General Exercises - Lower Extremity Ankle Circles/Pumps: AAROM;AROM;Both;10 reps Quad Sets: AROM;Both;10 reps Hip ABduction/ADduction: AAROM;AROM;Both;10 reps    General Comments General comments (skin integrity, edema, etc.): Pt is willing to work but very weak      Pertinent Vitals/Pain Pain Assessment: No/denies pain    Home Living                      Prior Function            PT Goals (current goals can now be found in the care plan section) Acute Rehab PT Goals Patient Stated Goal: to go home Progress towards PT goals: Progressing toward goals    Frequency  Min 2X/week    PT Plan Current plan remains appropriate    Co-evaluation             End of Session Equipment Utilized During Treatment: Gait belt Activity Tolerance: Patient limited by fatigue Patient left: in chair;with call bell/phone within reach;with chair alarm set     Time: 1138-1200 PT Time Calculation (min) (ACUTE ONLY): 22 min  Charges:  $Therapeutic  Activity: 8-22 mins                    G Codes:      Ramond Dial 12-30-2015, 1:13 PM   Mee Hives, PT MS Acute Rehab Dept. Number: [facility] [id number] and [place] [phone]

## 2015-12-10 NOTE — Clinical Social Work Note (Signed)
Clinical Social Work Assessment  Patient Details  Name: Mitchell Farrell. MRN: [id number] Date of Birth: 02/10/1919  Date of referral:  12/08/15               Reason for consult:  Facility Placement                Permission sought to share information with:  Facility Sport and exercise psychologist, Family Supports Permission granted to share information::  Yes, Verbal Permission Granted  Name::     Mitchell Farrell  Agency::     Relationship::  Daughter and POA  Contact Information:  [phone] (c) and [phone] (h)  Housing/Transportation Living arrangements for the past 2 months:  [place] of Information:  Patient Patient Interpreter Needed:  None Criminal Activity/Legal Involvement Pertinent to Current Situation/Hospitalization:  No - Comment as needed Significant Relationships:  Other Family Members ([place] lives with patient) Lives with:  Other (Comment) (Grandson) Do you feel safe going back to the place where you live?  No (Patient and his daughter and POA [name] in agreement with ST rehab.) Need for family participation in patient care:  Yes (Comment)  Care giving concerns:  Daughter indicated that patient's grandson lives with patient but works during the day and would not be available to assist patient.   Social Worker assessment / plan:  CSW talked with patient's daughter regarding d/c planning. Patient lying in bed and permitted Ms. McNeil to talk with CSW.  Ms. Leonides Schanz in agreement with ST rehab and reported that her facility preference is [facility] has patient has been to there before (over a year ago). Medicare insurance payments for rehab discussed.  Employment status:  Retired Forensic scientist:  Information systems manager, Educational psychologist E[employer] PPO) PT Recommendations:  [place] / Referral to community resources:  [place] (Daughter provided with skilled facility list for [facility])  Patient/Family's Response to care:  No concerns expressed by daughter regarding patient's care during hospitalization.  Patient/Family's Understanding of and Emotional Response to Diagnosis, Current Treatment, and Prognosis:  Not discussed.  Emotional Assessment Appearance:  Appears stated age Attitude/Demeanor/Rapport:  Other (Appropriate) Affect (typically observed):  Appropriate Orientation:  Oriented to Self, Oriented to Place, Oriented to Situation Alcohol / Substance use:  Never Used Psych involvement (Current and /or in the community):  No (Comment)  Discharge Needs  Concerns to be addressed:  Discharge Planning Concerns Readmission within the last 30 days:  No Current discharge risk:  None Barriers to Discharge:  No Barriers Identified   Sable Feil, LCSW 12/10/2015, 11:56 PM

## 2015-12-10 NOTE — Progress Notes (Signed)
ANTICOAGULATION CONSULT NOTE - Follow Up Consult  Pharmacy Consult for Coumadin Indication: atrial fibrillation  No Known Allergies  Patient Measurements: Height: 5\' 6"  (167.6 cm) Weight: 177 lb 3.2 oz (80.377 kg) IBW/kg (Calculated) : 63.8  Vital Signs: Temp: 98 F (36.7 C) (03/03 0535) Temp Source: Oral (03/03 0535) BP: 137/65 mmHg (03/03 0535) Pulse Rate: 58 (03/03 0800)  Labs:  Recent Labs  12/08/15 0458 12/09/15 0448 12/10/15 0345  HGB 10.7* 11.1*  --   HCT 34.7* 35.7*  --   PLT 123* 131*  --   LABPROT 24.1* 25.9* 26.8*  INR 2.18* 2.41* 2.51*  CREATININE 1.57* 1.42*  --     Estimated Creatinine Clearance: 31.7 mL/min (by C-G formula based on Cr of 1.42).  Assessment: 82 y.o. male admitted on 12/06/2015 with sepsis (eneralized weakness, cough, chills, anorexia, diarrhea, and confusion)  PMH: afib on warfarin, CKD3, arthritis, CVA, sHF  Anticoagulation: Coumadin PTA for afib, hx CVA. INR 2.51 today  PTA Coumadin 2.5mg  daily (using 5 mg tablets) Admit INR 1.74 on 2/27.  Nephrology: CKD 3, Scr 1.42  Hematology / Oncology - H&H 11.1/35.7, Plt 131  Goal of Therapy:  INR 2-3 Monitor platelets by anticoagulation protocol: Yes   Plan:  Continue Coumadin 2.5 mg daily Daily PT/INR for now (consider MWF if stable over weekend) CBC q72 h (due 3/5) Monitor s/sx of bleeding  Levester Fresh, PharmD, BCPS, [facility] Clinical Pharmacist Pager [phone] 12/10/2015 9:37 AM

## 2015-12-10 NOTE — Discharge Summary (Signed)
Physician Discharge Summary  [name] [id number] DOB: [birth year] DOA: 12/06/2015  PCP: Elsie Stain, MD  Admit date: 12/06/2015 Discharge date: 12/10/2015  Time spent: 35 minutes  Recommendations for Outpatient Follow-up:  Needs B-met to follow renal function.   Discharge Diagnoses:    Influenza B   AF (atrial fibrillation) (HCC)   Hypothyroidism   Warfarin anticoagulation   Acute renal failure superimposed on stage 3 chronic kidney disease (HCC)   PAD (peripheral artery disease) (HCC)   Systolic CHF ([place])   Acute CVA (cerebrovascular accident) ([place])   GERD (gastroesophageal reflux disease)   Sepsis ([place])   Cough   Diarrhea   Discharge Condition: stable  Diet recommendation: heart healthy  Filed Weights   12/08/15 0321 12/09/15 0559 12/10/15 0535  Weight: 80.74 kg (178 lb) 81.058 kg (178 lb 11.2 oz) 80.377 kg (177 lb 3.2 oz)    History of present illness:   HPI: Mitchell Farrell. is a 82 y.o. male with PMH of atrial fibrillation on Coumadin, chronic kidney disease-stage III, arthritis, chronic leg weakness, CVA, systolic congestive heart failure, GERD, hypothyroidism, who presents with cough, fever, chills, diarrhea and confusion.  Per his daughter, patient is actively functioning at baseline. He has been having dry cough, fever, chills in the past 3 days. He also has runny nose and dry throat. Patient does not have chest pain or shortness breath. He has generalized weakness, and mild confusion. Daughter also reports that patient has 3 -day diarrhea in the last week, which improved gradually. He had 2 loose bowel movement today. Patient does not have nausea, vomiting, abdominal pain. Per daughter, patient does not have symptoms of UTI or unilateral weakness.  In ED, patient was found to have negative urinalysis, troponin negative, lactate 1.89-->1.49, INR 1.74, WBC 6.1, tachycardia, temperature 102.9, slightly worsening renal function, negative chest x-ray for acute  abnormalities. Patient is admitted to inpatient for further eval and treatment.  Hospital Course:  Aksel Bencomo. is a 82 y.o. male with PMH of atrial fibrillation on Coumadin, chronic kidney disease-stage III, arthritis, chronic leg weakness, CVA, systolic congestive heart failure, GERD, hypothyroidism, who presents with cough, fever, chills, diarrhea and confusion.  Per his daughter, patient is actively functioning at baseline. He has been having dry cough, fever, chills in the past 3 days. He also has runny nose and dry throat. Patient does not have chest pain or shortness breath. He has generalized weakness, and mild confusion. Daughter also reports that patient has 3 -day diarrhea in the last week, which improved gradually. He had 2 loose bowel movement today. Patient does not have nausea, vomiting, abdominal pain. Per daughter, patient does not have symptoms of UTI or unilateral weakness.  Sepsis [facility]): Patient meets criteria for sepsis with fever and tachycardia. Lactate was normal. Hemodynamically stable. Initially the source of infection was not clear, patient was started with vancomycin and Zosyn by EDP. Flu pcr comes back positive for flu B. Therefore the most likely source of infection is Flu B. -Continue Tamiflu -Discontinue vanco and zosyn  Influenza B -On Tamiflu Day 3 today, needs 2 more days for total of 5.   Diarrhea: Likely due to flu B, but need to rule out other possibilities, such as C. difficile colitis. He was given 1 dose of Flagyl before flu PCR comes back. Diarrhea resolved.  Resolved.  -When necessary Zofran for nausea  Acute encephalopathy: Patient has mild confusion. Likely due to multifactorial etiologies, including sepsis, flu, worsening renal function and dehydration. -  Treat underlying problems -resolved GERD: -will switch PPI to pepcid IV until C diff pcr negative  Atrial Fibrillation: CHA2DS2-VASc Score is 4, needs oral anticoagulation. Patient is on  Coumadin. INR is 1.74 on admission. Heart rate is ~110. -continue coumadin per pharmacy -Continue Coreg  Hypothyroidism: Last TSH was 1.30 on 09/20/15 -Continue home Synthroid  Acute renal failure superimposed on stage 3 chronic kidney disease ([place]): Mildly worsened kidney function. Baseline creatinine 1.3-1.6, his creatinine is 1.75, BUN 17 on admission. Likely due to prerenal secondary to dehydration and continuation of diruetics, NSAIDs. - NSL fluid.  -improving.  - Hold Lasix and naproxen Resume lasix tomorrow.   Systolic CHF [facility]): Patient used to have a systolic congestive heart failure. 2-D echo on 02/07/13 showed EF 30-35 percent. Recent 2-D echo 02/19/14 showed EF 55-60 percent. . Lung is clear, does not seem to have acute exacerbation. -will hold Lasix due to sepsis -Continue Coreg -patient on coumadin, INR therapeutic.   Procedures:  none  Consultations:  none  Discharge Exam: Filed Vitals:   12/10/15 0535 12/10/15 0800  BP: 137/65   Pulse:  58  Temp: 98 F (36.7 C)   Resp: 16     General: NAD Cardiovascular: S 1, S 2 RRR Respiratory: CTA  Discharge Instructions   Discharge Instructions    Diet - low sodium heart healthy    Complete by:  As directed      Increase activity slowly    Complete by:  As directed           Current Discharge Medication List    START taking these medications   Details  oseltamivir (TAMIFLU) 30 MG capsule Take 1 capsule (30 mg total) by mouth 2 (two) times daily. Qty: 5 capsule, Refills: 0      CONTINUE these medications which have CHANGED   Details  furosemide (LASIX) 20 MG tablet Take 1 tablet (20 mg total) by mouth daily. Qty: 20 tablet, Refills: 0    warfarin (COUMADIN) 5 MG tablet Take 0.5 tablets (2.5 mg total) by mouth daily. Qty: 50 tablet, Refills: 12      CONTINUE these medications which have NOT CHANGED   Details  carvedilol (COREG) 3.125 MG tablet Take 1 tablet (3.125 mg total) by mouth 2 (two) times  daily with a meal. Qty: 180 tablet, Refills: 3    gabapentin (NEURONTIN) 100 MG capsule Take 1-2 capsules (100-200 mg total) by mouth at bedtime. Qty: 180 capsule, Refills: 3    levothyroxine (SYNTHROID, LEVOTHROID) 88 MCG tablet Take 1 tablet (88 mcg total) by mouth daily. Qty: 90 tablet, Refills: 3    omeprazole (PRILOSEC OTC) 20 MG tablet Take 1 tablet (20 mg total) by mouth daily. Qty: 90 tablet, Refills: 3    Polyethyl Glycol-Propyl Glycol (SYSTANE OP) Apply to eye as needed.      STOP taking these medications     naproxen sodium (ANAPROX) 220 MG tablet        No Known Allergies    The results of significant diagnostics from this hospitalization (including imaging, microbiology, ancillary and laboratory) are listed below for reference.    Significant Diagnostic Studies: Dg Chest Port 1 View  12/06/2015  CLINICAL DATA:  Onset today general weakness inability to stand by himself, cough, chills, loss of appetite, and confusion stated by daughter. hx: afib, CKD EXAM: PORTABLE CHEST 1 VIEW COMPARISON:  02/19/2014 FINDINGS: Midline trachea. Normal heart size. Right paratracheal soft tissue fullness is similar to the 2015 exam and likely  due to prominent great vessels. Right hemidiaphragm elevation. No pleural effusion or pneumothorax. Suspect areas of right greater the left scarring with similar nonspecific lower lobe predominant interstitial prominence. No superimposed consolidation. No congestive failure. IMPRESSION: No acute cardiopulmonary disease. Electronically Signed   By: Abigail Miyamoto M.D.   On: 12/06/2015 21:02    Microbiology: Recent Results (from the past 240 hour(s))  Blood Culture (routine x 2)     Status: None (Preliminary result)   Collection Time: 12/06/15  8:30 PM  Result Value Ref Range Status   Specimen Description BLOOD LEFT HAND  Final   Special Requests IN PEDIATRIC BOTTLE 4CC  Final   Culture NO GROWTH 3 DAYS  Final   Report Status PENDING  Incomplete   Blood Culture (routine x 2)     Status: None (Preliminary result)   Collection Time: 12/06/15  8:35 PM  Result Value Ref Range Status   Specimen Description BLOOD RIGHT HAND  Final   Special Requests IN PEDIATRIC BOTTLE 2CC  Final   Culture NO GROWTH 3 DAYS  Final   Report Status PENDING  Incomplete  Urine culture     Status: None   Collection Time: 12/06/15  9:20 PM  Result Value Ref Range Status   Specimen Description URINE, RANDOM  Final   Special Requests NONE  Final   Culture NO GROWTH 2 DAYS  Final   Report Status 12/08/2015 FINAL  Final  Respiratory virus panel     Status: Abnormal   Collection Time: 12/07/15  8:02 AM  Result Value Ref Range Status   Respiratory Syncytial Virus A Negative Negative Final   Respiratory Syncytial Virus B Negative Negative Final   Influenza A Negative Negative Final   Influenza B Positive (A) Negative Final   Parainfluenza 1 Negative Negative Final   Parainfluenza 2 Negative Negative Final   Parainfluenza 3 Negative Negative Final   Metapneumovirus Negative Negative Final   Rhinovirus Negative Negative Final   Adenovirus Negative Negative Final    Comment: (NOTE) Performed At: [facility] [place], [place] [id number] Lindon Romp MD [id number]      Labs: Basic Metabolic Panel:  Recent Labs Lab 12/06/15 2041 12/07/15 0220 12/08/15 0458 12/09/15 0448  NA 138 140 141 138  K 4.7 4.2 4.1 3.9  CL 106 108 110 112*  CO2 19* 21* 21* 19*  GLUCOSE 105* 109* 85 88  BUN _0 CREATININE 1.75* 1.74* 1.57* 1.42*  CALCIUM 8.6* 8.0* 8.0* 7.8*   Liver Function Tests:  Recent Labs Lab 12/06/15 2041  AST 42*  ALT 17  ALKPHOS 79  BILITOT 1.2  PROT 7.8  ALBUMIN 3.2*   No results for input(s): LIPASE, AMYLASE in the last 168 hours. No results for input(s): AMMONIA in the last 168 hours. CBC:  Recent Labs Lab 12/06/15 2041 12/07/15 0220 12/08/15 0458 12/09/15 0448  WBC 6.1 6.4 4.2 4.8   NEUTROABS 5.0  --   --   --   HGB 13.5 12.2* 10.7* 11.1*  HCT 43.0 39.6 34.7* 35.7*  MCV 86.2 86.3 85.9 85.4  PLT 144* 145* 123* 131*   Cardiac Enzymes: No results for input(s): CKTOTAL, CKMB, CKMBINDEX, TROPONINI in the last 168 hours. BNP: BNP (last 3 results)  Recent Labs  12/07/15 0220  BNP 252.9*    ProBNP (last 3 results) No results for input(s): PROBNP in the last 8760 hours.  CBG:  Recent Labs Lab 12/08/15 [phone] 12/09/15 0655 12/10/15  0532  GLUCAP 89 78 83       Signed:  Niel Hummer A MD.  [facility] 12/10/2015, 10:07 AM

## 2015-12-11 LAB — CULTURE, BLOOD (ROUTINE X 2)
CULTURE: NO GROWTH
CULTURE: NO GROWTH

## 2015-12-11 LAB — GLUCOSE, CAPILLARY: GLUCOSE-CAPILLARY: 85 mg/dL (ref 65–99)

## 2015-12-11 LAB — PROTIME-INR
INR: 2.56 — AB (ref 0.00–1.49)
PROTHROMBIN TIME: 27.1 s — AB (ref 11.6–15.2)

## 2015-12-11 NOTE — Progress Notes (Signed)
Pt for trans to [facility], report called. Pt in no resp distress at present. Staff aware of flu precautions

## 2015-12-11 NOTE — Clinical Social Work Placement (Signed)
   CLINICAL SOCIAL WORK PLACEMENT  NOTE  Date:  12/11/2015  Patient Details  Name: Mitchell Farrell. MRN: [id number] Date of Birth: [birth year]  Clinical Social Work is seeking post-discharge placement for this patient at the [place] level of care (*CSW will initial, date and re-position this form in  chart as items are completed):  Yes   Patient/family provided with [place] Work Department's list of facilities offering this level of care within the geographic area requested by the patient (or if unable, by the patient's family).  Yes   Patient/family informed of their freedom to choose among providers that offer the needed level of care, that participate in Medicare, Medicaid or managed care program needed by the patient, have an available bed and are willing to accept the patient.  Yes   Patient/family informed of [place]'s ownership interest in [facility] and [facility], as well as of the fact that they are under no obligation to receive care at these facilities.  PASRR submitted to EDS on       PASRR number received on       Existing PASRR number confirmed on 12/10/15     FL2 transmitted to all facilities in geographic area requested by pt/family on 12/10/15     FL2 transmitted to all facilities within larger geographic area on       Patient informed that his/her managed care company has contracts with or will negotiate with certain facilities, including the following:            Patient/family informed of bed offers received.  Patient chooses bed at       Physician recommends and patient chooses bed at      Patient to be transferred to   on  .  Patient to be transferred to facility by       Patient family notified on   of transfer.  Name of family member notified:        PHYSICIAN Please sign FL2     Additional Comment:    _______________________________________________ Sable Feil, LCSW 12/11/2015,  12:04 AM

## 2015-12-11 NOTE — Clinical Social Work Placement (Signed)
   CLINICAL SOCIAL WORK PLACEMENT  NOTE  Date:  12/11/2015  Patient Details  Name: Mitchell Farrell. MRN: [id number] Date of Birth: Sep 20, 1919  Clinical Social Work is seeking post-discharge placement for this patient at the [place] level of care (*CSW will initial, date and re-position this form in  chart as items are completed):  Yes   Patient/family provided with [place] Work Department's list of facilities offering this level of care within the geographic area requested by the patient (or if unable, by the patient's family).  Yes   Patient/family informed of their freedom to choose among providers that offer the needed level of care, that participate in Medicare, Medicaid or managed care program needed by the patient, have an available bed and are willing to accept the patient.  Yes   Patient/family informed of [place]'s ownership interest in [facility] and [facility], as well as of the fact that they are under no obligation to receive care at these facilities.  PASRR submitted to EDS on       PASRR number received on       Existing PASRR number confirmed on 12/10/15     FL2 transmitted to all facilities in geographic area requested by pt/family on 12/10/15     FL2 transmitted to all facilities within larger geographic area on       Patient informed that his/her managed care company has contracts with or will negotiate with certain facilities, including the following:            Patient/family informed of bed offers received.  Patient chooses bed at [facility]     Physician recommends and patient chooses bed at      Patient to be transferred to [facility] on 12/11/15.  Patient to be transferred to facility by PTAR     Patient family notified on 12/11/15 of transfer.  Name of family member notified:  Daughter     PHYSICIAN Please sign FL2     Additional Comment:     _______________________________________________ Benard Halsted, [place] 12/11/2015, 5:14 PM

## 2015-12-11 NOTE — Progress Notes (Signed)
Patient will DC to: [facility] Anticipated DC date: 12/11/15 Family notified: Daughter Transport by: PTAR 2:30pm  CSW signing off.  Cedric Fishman, [place] Social Worker [phone]

## 2015-12-11 NOTE — Progress Notes (Signed)
ANTICOAGULATION CONSULT NOTE - Follow Up Consult  Pharmacy Consult for heparin Indication: atrial fibrillation  No Known Allergies  Patient Measurements: Height: 5\' 6"  (167.6 cm) Weight: 173 lb 15.1 oz (78.9 kg) IBW/kg (Calculated) : 63.8  Vital Signs: Temp: 98 F (36.7 C) (03/04 0540) Temp Source: Oral (03/04 0540) BP: 129/75 mmHg (03/04 0540) Pulse Rate: 53 (03/04 0540)  Labs:  Recent Labs  12/09/15 0448 12/10/15 0345 12/11/15 0545  HGB 11.1*  --   --   HCT 35.7*  --   --   PLT 131*  --   --   LABPROT 25.9* 26.8* 27.1*  INR 2.41* 2.51* 2.56*  CREATININE 1.42*  --   --     Estimated Creatinine Clearance: 31.4 mL/min (by C-G formula based on Cr of 1.42).  Assessment: 82 yo M admitted 12/06/2015 on coumadin PTA for afib and hx of CVA. Pharmacy consulted to dose warfarin.  INR 2.56, H/H stable, No s.sx of bleeding noted.  PTA Coumadin 2.5mg  daily (using 5 mg tablets) Admit INR 1.74 on 2/27.  Goal of Therapy:  INR 2-3 Monitor platelets by anticoagulation protocol: Yes   Plan:  Continue Coumadin 2.5 mg daily Daily PT/INR for now (consider MWF if stable over weekend) CBC q72 h (due 3/5) Monitor s/sx of bleeding  Dimitri Ped, PharmD. PGY-1 Pharmacy Resident Pager: [phone] 12/11/2015,8:56 AM

## 2015-12-11 NOTE — Discharge Summary (Signed)
Physician Discharge Summary  Mitchell Farrell. FEO:[id number] DOB: 1919/05/13 DOA: 12/06/2015  PCP: Elsie Stain, MD  Admit date: 12/06/2015 Discharge date: 12/11/2015  Time spent: 35 minutes  Recommendations for Outpatient Follow-up:  Needs B-met to follow renal function.   Discharge Diagnoses:    Influenza B   AF (atrial fibrillation) (HCC)   Hypothyroidism   Warfarin anticoagulation   Acute renal failure superimposed on stage 3 chronic kidney disease (HCC)   PAD (peripheral artery disease) (HCC)   Systolic CHF ([place])   Acute CVA (cerebrovascular accident) ([place])   GERD (gastroesophageal reflux disease)   Sepsis ([place])   Cough   Diarrhea   Discharge Condition: stable  Diet recommendation: heart healthy  Filed Weights   12/09/15 0559 12/10/15 0535 12/11/15 0540  Weight: 81.058 kg (178 lb 11.2 oz) 80.377 kg (177 lb 3.2 oz) 78.9 kg (173 lb 15.1 oz)    History of present illness:   HPI: Mitchell Farrell. is a 82 y.o. male with PMH of atrial fibrillation on Coumadin, chronic kidney disease-stage III, arthritis, chronic leg weakness, CVA, systolic congestive heart failure, GERD, hypothyroidism, who presents with cough, fever, chills, diarrhea and confusion.  Per his daughter, patient is actively functioning at baseline. He has been having dry cough, fever, chills in the past 3 days. He also has runny nose and dry throat. Patient does not have chest pain or shortness breath. He has generalized weakness, and mild confusion. Daughter also reports that patient has 3 -day diarrhea in the last week, which improved gradually. He had 2 loose bowel movement today. Patient does not have nausea, vomiting, abdominal pain. Per daughter, patient does not have symptoms of UTI or unilateral weakness.  In ED, patient was found to have negative urinalysis, troponin negative, lactate 1.89-->1.49, INR 1.74, WBC 6.1, tachycardia, temperature 102.9, slightly worsening renal function, negative chest x-ray for  acute abnormalities. Patient is admitted to inpatient for further eval and treatment.  Hospital Course:  Mitchell Farrell. is a 82 y.o. male with PMH of atrial fibrillation on Coumadin, chronic kidney disease-stage III, arthritis, chronic leg weakness, CVA, systolic congestive heart failure, GERD, hypothyroidism, who presents with cough, fever, chills, diarrhea and confusion.  Per his daughter, patient is actively functioning at baseline. He has been having dry cough, fever, chills in the past 3 days. He also has runny nose and dry throat. Patient does not have chest pain or shortness breath. He has generalized weakness, and mild confusion. Daughter also reports that patient has 3 -day diarrhea in the last week, which improved gradually. He had 2 loose bowel movement today. Patient does not have nausea, vomiting, abdominal pain. Per daughter, patient does not have symptoms of UTI or unilateral weakness.  Sepsis [facility]): Patient meets criteria for sepsis with fever and tachycardia. Lactate was normal. Hemodynamically stable. Initially the source of infection was not clear, patient was started with vancomycin and Zosyn by EDP. Flu pcr comes back positive for flu B. Therefore the most likely source of infection is Flu B. -Continue Tamiflu -Discontinue vanco and zosyn  Influenza B -On Tamiflu Day 3 today, needs 2 more days for total of 5.  No change in medical condition.  Awaiting bed SNF.   Diarrhea: Likely due to flu B, but need to rule out other possibilities, such as C. difficile colitis. He was given 1 dose of Flagyl before flu PCR comes back. Diarrhea resolved.  Resolved.  -When necessary Zofran for nausea  Acute encephalopathy: Patient has mild confusion. Likely  due to multifactorial etiologies, including sepsis, flu, worsening renal function and dehydration. -Treat underlying problems -resolved GERD: -will switch PPI to pepcid IV until C diff pcr negative  Atrial Fibrillation:  CHA2DS2-VASc Score is 4, needs oral anticoagulation. Patient is on Coumadin. INR is 1.74 on admission. Heart rate is ~110. -continue coumadin per pharmacy -Continue Coreg  Hypothyroidism: Last TSH was 1.30 on 09/20/15 -Continue home Synthroid  Acute renal failure superimposed on stage 3 chronic kidney disease ([place]): Mildly worsened kidney function. Baseline creatinine 1.3-1.6, his creatinine is 1.75, BUN 17 on admission. Likely due to prerenal secondary to dehydration and continuation of diruetics, NSAIDs. - NSL fluid.  -improving.  - Hold Lasix and naproxen Resume lasix tomorrow.   Systolic CHF [facility]): Patient used to have a systolic congestive heart failure. 2-D echo on 02/07/13 showed EF 30-35 percent. Recent 2-D echo 02/19/14 showed EF 55-60 percent. . Lung is clear, does not seem to have acute exacerbation. -will hold Lasix due to sepsis -Continue Coreg -patient on coumadin, INR therapeutic.   Procedures:  none  Consultations:  none  Discharge Exam: Filed Vitals:   12/10/15 2150 12/11/15 0540  BP: 141/80 129/75  Pulse: 104 53  Temp: 97.9 F (36.6 C) 98 F (36.7 C)  Resp: 16 16    General: NAD Cardiovascular: S 1, S 2 RRR Respiratory: CTA  Discharge Instructions   Discharge Instructions    Diet - low sodium heart healthy    Complete by:  As directed      Diet - low sodium heart healthy    Complete by:  As directed      Increase activity slowly    Complete by:  As directed      Increase activity slowly    Complete by:  As directed           Current Discharge Medication List    START taking these medications   Details  oseltamivir (TAMIFLU) 30 MG capsule Take 1 capsule (30 mg total) by mouth 2 (two) times daily. Qty: 5 capsule, Refills: 0      CONTINUE these medications which have CHANGED   Details  furosemide (LASIX) 20 MG tablet Take 1 tablet (20 mg total) by mouth daily. Qty: 20 tablet, Refills: 0    warfarin (COUMADIN) 5 MG tablet Take 0.5  tablets (2.5 mg total) by mouth daily. Qty: 50 tablet, Refills: 12      CONTINUE these medications which have NOT CHANGED   Details  carvedilol (COREG) 3.125 MG tablet Take 1 tablet (3.125 mg total) by mouth 2 (two) times daily with a meal. Qty: 180 tablet, Refills: 3    gabapentin (NEURONTIN) 100 MG capsule Take 1-2 capsules (100-200 mg total) by mouth at bedtime. Qty: 180 capsule, Refills: 3    levothyroxine (SYNTHROID, LEVOTHROID) 88 MCG tablet Take 1 tablet (88 mcg total) by mouth daily. Qty: 90 tablet, Refills: 3    omeprazole (PRILOSEC OTC) 20 MG tablet Take 1 tablet (20 mg total) by mouth daily. Qty: 90 tablet, Refills: 3    Polyethyl Glycol-Propyl Glycol (SYSTANE OP) Apply to eye as needed.      STOP taking these medications     naproxen sodium (ANAPROX) 220 MG tablet        No Known Allergies Follow-up Information    Please follow up.   Why:  SNF WILL FOLLOW UP       The results of significant diagnostics from this hospitalization (including imaging, microbiology, ancillary and laboratory) are listed  below for reference.    Significant Diagnostic Studies: Dg Chest Port 1 View  12/06/2015  CLINICAL DATA:  Onset today general weakness inability to stand by himself, cough, chills, loss of appetite, and confusion stated by daughter. hx: afib, CKD EXAM: PORTABLE CHEST 1 VIEW COMPARISON:  02/19/2014 FINDINGS: Midline trachea. Normal heart size. Right paratracheal soft tissue fullness is similar to the 2015 exam and likely due to prominent great vessels. Right hemidiaphragm elevation. No pleural effusion or pneumothorax. Suspect areas of right greater the left scarring with similar nonspecific lower lobe predominant interstitial prominence. No superimposed consolidation. No congestive failure. IMPRESSION: No acute cardiopulmonary disease. Electronically Signed   By: Abigail Miyamoto M.D.   On: 12/06/2015 21:02    Microbiology: Recent Results (from the past 240 hour(s))   Blood Culture (routine x 2)     Status: None (Preliminary result)   Collection Time: 12/06/15  8:30 PM  Result Value Ref Range Status   Specimen Description BLOOD LEFT HAND  Final   Special Requests IN PEDIATRIC BOTTLE 4CC  Final   Culture NO GROWTH 4 DAYS  Final   Report Status PENDING  Incomplete  Blood Culture (routine x 2)     Status: None (Preliminary result)   Collection Time: 12/06/15  8:35 PM  Result Value Ref Range Status   Specimen Description BLOOD RIGHT HAND  Final   Special Requests IN PEDIATRIC BOTTLE 2CC  Final   Culture NO GROWTH 4 DAYS  Final   Report Status PENDING  Incomplete  Urine culture     Status: None   Collection Time: 12/06/15  9:20 PM  Result Value Ref Range Status   Specimen Description URINE, RANDOM  Final   Special Requests NONE  Final   Culture NO GROWTH 2 DAYS  Final   Report Status 12/08/2015 FINAL  Final  Respiratory virus panel     Status: Abnormal   Collection Time: 12/07/15  8:02 AM  Result Value Ref Range Status   Respiratory Syncytial Virus A Negative Negative Final   Respiratory Syncytial Virus B Negative Negative Final   Influenza A Negative Negative Final   Influenza B Positive (A) Negative Final   Parainfluenza 1 Negative Negative Final   Parainfluenza 2 Negative Negative Final   Parainfluenza 3 Negative Negative Final   Metapneumovirus Negative Negative Final   Rhinovirus Negative Negative Final   Adenovirus Negative Negative Final    Comment: (NOTE) Performed At: [facility] [place], [place] [id number] Lindon Romp MD XL:[id number]      Labs: Basic Metabolic Panel:  Recent Labs Lab 12/06/15 2041 12/07/15 0220 12/08/15 0458 12/09/15 0448  NA 138 140 141 138  K 4.7 4.2 4.1 3.9  CL 106 108 110 112*  CO2 19* 21* 21* 19*  GLUCOSE 105* 109* 85 88  BUN 17 17 17 16   CREATININE 1.75* 1.74* 1.57* 1.42*  CALCIUM 8.6* 8.0* 8.0* 7.8*   Liver Function Tests:  Recent Labs Lab 12/06/15 2041   AST 42*  ALT 17  ALKPHOS 79  BILITOT 1.2  PROT 7.8  ALBUMIN 3.2*   No results for input(s): LIPASE, AMYLASE in the last 168 hours. No results for input(s): AMMONIA in the last 168 hours. CBC:  Recent Labs Lab 12/06/15 2041 12/07/15 0220 12/08/15 0458 12/09/15 0448  WBC 6.1 6.4 4.2 4.8  NEUTROABS 5.0  --   --   --   HGB 13.5 12.2* 10.7* 11.1*  HCT 43.0 39.6 34.7* 35.7*  MCV  86.2 86.3 85.9 85.4  PLT 144* 145* 123* 131*   Cardiac Enzymes: No results for input(s): CKTOTAL, CKMB, CKMBINDEX, TROPONINI in the last 168 hours. BNP: BNP (last 3 results)  Recent Labs  12/07/15 0220  BNP 252.9*    ProBNP (last 3 results) No results for input(s): PROBNP in the last 8760 hours.  CBG:  Recent Labs Lab 12/08/15 0619 12/09/15 0655 12/10/15 0532 12/11/15 0704  GLUCAP 89 78 83 85       Signed:  Niel Hummer A MD.  [facility] 12/11/2015, 9:00 AM

## 2015-12-20 ENCOUNTER — Ambulatory Visit: Payer: MEDICARE

## 2016-01-28 ENCOUNTER — Telehealth: Payer: Self-pay | Admitting: Family Medicine

## 2016-01-28 ENCOUNTER — Ambulatory Visit (INDEPENDENT_AMBULATORY_CARE_PROVIDER_SITE_OTHER): Payer: MEDICARE | Admitting: *Deleted

## 2016-01-28 DIAGNOSIS — Z5181 Encounter for therapeutic drug level monitoring: Secondary | ICD-10-CM

## 2016-01-28 DIAGNOSIS — I482 Chronic atrial fibrillation, unspecified: Secondary | ICD-10-CM

## 2016-01-28 LAB — POCT INR: INR: 2.5

## 2016-01-28 NOTE — Progress Notes (Signed)
Pre visit review using our clinic review tool, if applicable. No additional management support is needed unless otherwise documented below in the visit note. 

## 2016-01-28 NOTE — Telephone Encounter (Signed)
Home health called -  Pt result - 29.8 inr - 2.5 Coumadin is 2.5 mg daily Wants to know when next lab draw is  cb number is [phone] Thanks

## 2016-01-28 NOTE — Telephone Encounter (Signed)
See anticoagulation encounter

## 2016-01-31 ENCOUNTER — Telehealth: Payer: Self-pay | Admitting: *Deleted

## 2016-01-31 ENCOUNTER — Ambulatory Visit: Payer: MEDICARE | Admitting: Podiatry

## 2016-01-31 MED ORDER — ACETAMINOPHEN 500 MG PO TABS: 500.0000 mg | ORAL_TABLET | Freq: Four times a day (QID) | ORAL | Status: AC | PRN

## 2016-01-31 NOTE — Telephone Encounter (Signed)
[name] left a voicemail requesting a verbal order for OT for two times a week for 4 weeks.  Okay to leave verbal order on voicemail.

## 2016-01-31 NOTE — Telephone Encounter (Signed)
Barbara advised.

## 2016-01-31 NOTE — Telephone Encounter (Signed)
Patient's daughter called wanting to know if it is okay to give patient Tylenol and how often can they give it to him? Patient is complaining of his foot hurting and he told them that the nurses at [facility] gave it to him on a regular basis.

## 2016-01-31 NOTE — Telephone Encounter (Signed)
Tylenol 500mg  4 times a day if needed.  Okay to give on a schedule or use prn.  Update [place] if not better.  Thanks.

## 2016-02-01 NOTE — Telephone Encounter (Signed)
Left detailed message on voicemail.  

## 2016-02-01 NOTE — Telephone Encounter (Signed)
Please give the order.  Thanks.   

## 2016-02-03 ENCOUNTER — Encounter: Payer: Self-pay | Admitting: Family Medicine

## 2016-02-03 ENCOUNTER — Ambulatory Visit (INDEPENDENT_AMBULATORY_CARE_PROVIDER_SITE_OTHER): Payer: MEDICARE | Admitting: Family Medicine

## 2016-02-03 VITALS — BP 110/74 | HR 80 | Temp 98.6°F | Wt 179.2 lb

## 2016-02-03 DIAGNOSIS — R29898 Other symptoms and signs involving the musculoskeletal system: Secondary | ICD-10-CM | POA: Diagnosis not present

## 2016-02-03 DIAGNOSIS — R609 Edema, unspecified: Secondary | ICD-10-CM

## 2016-02-03 MED ORDER — FUROSEMIDE 20 MG PO TABS: 20.0000 mg | ORAL_TABLET | Freq: Every day | ORAL | Status: DC | PRN

## 2016-02-03 NOTE — Patient Instructions (Signed)
Ask the agency coming out the house about adding on PT/OT.  Have them contact me if needed.  Elevated your legs as needed, avoid salt.   Take lasix as needed for swelling.  Take tylenol as needed for pain.  Document the "as needed" meds for tracking.  Take care.  Glad to see you.

## 2016-02-03 NOTE — Progress Notes (Signed)
Pre visit review using our clinic review tool, if applicable. No additional management support is needed unless otherwise documented below in the visit note.  Prev with flu admission.  Then to SNF prev, now back home. Was at [facility] for 3 weeks.  He had PT while there.  He noted that his ability to stand was better.  He still has limited walking ability.  He hardware/bracing for L foot and leg weakness.  Eating okay- weight is up but likely not just fluid.  He isn't SOB or having CP.  He is off lasix in the meantime.  Was prev on daily lasix at SNF.  D/w pt about prn lasix use, along with prn tylenol use at night for foot pain.  His foot pain is some better with elevation at home at night.  He has an adjustable bed that helps with repositioning for pain control.   ROS: See HPI, otherwise noncontributory.  Unless stated above, no stated [facility] or other sx of acute cardiopulmonary or neurologic compromise.  Should any positive be noted in the HPI, it would supercede this ROS statement.     Meds, vitals, and allergies reviewed.   nad ncat Mmm Neck supple, no LA IRR, not tachy ctab  Abd soft L > R leg weakness with L foot in brace to help with weight bearing.  In wheelchair during the exam.  Sensation still grossly wnl BLE Trace BLE edema

## 2016-02-04 NOTE — Assessment & Plan Note (Signed)
Family to ask the agency coming out the house about adding on PT/OT. They'll have them contact me if needed.  He needs to continue PT/OT for L leg weakness.   Parking form done for handicap sticker at [facility].  >25 minutes spent in face to face time with patient, >50% spent in counselling or coordination of care.

## 2016-02-04 NOTE — Assessment & Plan Note (Signed)
Doesn't look to be in failure, can continue prn lasix if SOB, inc in weight, or more BLE edema.  D/w pt and family, all agree.

## 2016-02-11 ENCOUNTER — Telehealth: Payer: Self-pay | Admitting: Family Medicine

## 2016-02-11 NOTE — Telephone Encounter (Signed)
Mark Needs verbal order for 2x week for 5 weeks For home health PT

## 2016-02-11 NOTE — Telephone Encounter (Signed)
Please give the order.  Thanks.   

## 2016-02-11 NOTE — Telephone Encounter (Signed)
Left detailed message on voicemail of Mitchell Farrell, [facility].

## 2016-02-14 DIAGNOSIS — I4891 Unspecified atrial fibrillation: Secondary | ICD-10-CM

## 2016-02-14 DIAGNOSIS — I69344 Monoplegia of lower limb following cerebral infarction affecting left non-dominant side: Secondary | ICD-10-CM | POA: Diagnosis not present

## 2016-02-14 DIAGNOSIS — I5040 Unspecified combined systolic (congestive) and diastolic (congestive) heart failure: Secondary | ICD-10-CM | POA: Diagnosis not present

## 2016-02-14 DIAGNOSIS — N184 Chronic kidney disease, stage 4 (severe): Secondary | ICD-10-CM | POA: Diagnosis not present

## 2016-03-02 ENCOUNTER — Telehealth: Payer: Self-pay | Admitting: *Deleted

## 2016-03-02 ENCOUNTER — Ambulatory Visit (INDEPENDENT_AMBULATORY_CARE_PROVIDER_SITE_OTHER): Payer: MEDICARE | Admitting: *Deleted

## 2016-03-02 DIAGNOSIS — Z5181 Encounter for therapeutic drug level monitoring: Secondary | ICD-10-CM

## 2016-03-02 DIAGNOSIS — I482 Chronic atrial fibrillation, unspecified: Secondary | ICD-10-CM

## 2016-03-02 LAB — POCT INR: INR: 3.6

## 2016-03-02 NOTE — Progress Notes (Signed)
Pre visit review using our clinic review tool, if applicable. No additional management support is needed unless otherwise documented below in the visit note. 

## 2016-03-02 NOTE — Telephone Encounter (Signed)
Call from [place] with home health.  Mr. Cimorelli has developed an ulcer on his left heel.  Verbal orders needed for nursing to treat x2 weeks.  Please advise.  Okay to leave a detailed message if no answer.

## 2016-03-02 NOTE — Telephone Encounter (Signed)
Please give the order.  Thanks.   

## 2016-03-02 NOTE — Telephone Encounter (Signed)
Left detailed message on voicemail.  

## 2016-03-13 ENCOUNTER — Other Ambulatory Visit: Payer: Self-pay | Admitting: *Deleted

## 2016-03-13 MED ORDER — GABAPENTIN 100 MG PO CAPS: 100.0000 mg | ORAL_CAPSULE | Freq: Every day | ORAL | Status: DC

## 2016-03-13 MED ORDER — OMEPRAZOLE MAGNESIUM 20 MG PO TBEC: 20.0000 mg | DELAYED_RELEASE_TABLET | Freq: Every day | ORAL | Status: DC

## 2016-03-13 MED ORDER — LEVOTHYROXINE SODIUM 88 MCG PO TABS: 88.0000 ug | ORAL_TABLET | Freq: Every day | ORAL | Status: DC

## 2016-03-13 MED ORDER — CARVEDILOL 3.125 MG PO TABS: 3.1250 mg | ORAL_TABLET | Freq: Two times a day (BID) | ORAL | Status: DC

## 2016-03-13 NOTE — Telephone Encounter (Signed)
Electronic refill request. Last Filled:    180 capsule 3 02/09/2015  Last office visit:   April 2017  Please advise.

## 2016-03-15 ENCOUNTER — Telehealth: Payer: Self-pay

## 2016-03-15 NOTE — Telephone Encounter (Signed)
Mark PT with [facility] left v/m requesting verbal order to extend home health PT 2 x a week for 2 weeks. Will review at recert time.

## 2016-03-16 NOTE — Telephone Encounter (Signed)
Mark advised.

## 2016-03-16 NOTE — Telephone Encounter (Signed)
Please give the order.  Thanks.   

## 2016-03-17 ENCOUNTER — Ambulatory Visit (INDEPENDENT_AMBULATORY_CARE_PROVIDER_SITE_OTHER): Payer: MEDICARE | Admitting: *Deleted

## 2016-03-17 DIAGNOSIS — Z5181 Encounter for therapeutic drug level monitoring: Secondary | ICD-10-CM

## 2016-03-17 DIAGNOSIS — I482 Chronic atrial fibrillation, unspecified: Secondary | ICD-10-CM

## 2016-03-17 LAB — POCT INR: INR: 2.7

## 2016-03-17 NOTE — Progress Notes (Signed)
Pre visit review using our clinic review tool, if applicable. No additional management support is needed unless otherwise documented below in the visit note. 

## 2016-03-24 ENCOUNTER — Telehealth: Payer: Self-pay

## 2016-03-24 NOTE — Telephone Encounter (Signed)
Order given to [employer].

## 2016-03-24 NOTE — Telephone Encounter (Signed)
Please give the order.  Thanks.   

## 2016-03-24 NOTE — Telephone Encounter (Signed)
Darlene with [facility] left v/m requesting verbal orders to recertify pt for home health therapy and nursing.

## 2016-04-08 DIAGNOSIS — I69344 Monoplegia of lower limb following cerebral infarction affecting left non-dominant side: Secondary | ICD-10-CM

## 2016-04-08 DIAGNOSIS — I5040 Unspecified combined systolic (congestive) and diastolic (congestive) heart failure: Secondary | ICD-10-CM

## 2016-04-08 DIAGNOSIS — L89620 Pressure ulcer of left heel, unstageable: Secondary | ICD-10-CM | POA: Diagnosis not present

## 2016-04-08 DIAGNOSIS — I4891 Unspecified atrial fibrillation: Secondary | ICD-10-CM | POA: Diagnosis not present

## 2016-04-14 ENCOUNTER — Telehealth: Payer: Self-pay | Admitting: Family Medicine

## 2016-04-14 NOTE — Telephone Encounter (Signed)
Rick,[facility],called with patient's PT/INR results. PT-23.0 and INR-1.9. Please call Rick back.  If there are any changes, please  fax new order to Fax# [phone].

## 2016-04-14 NOTE — Telephone Encounter (Signed)
Goal INR 2-3, so I wouldn't change with his level 0.1 from goal.   Continue as is.  Recheck in 4 weeks.  Thanks.

## 2016-04-14 NOTE — Telephone Encounter (Signed)
Fenton Foy advised.  Order faxed for recheck.

## 2016-05-23 ENCOUNTER — Encounter: Payer: Self-pay | Admitting: Sports Medicine

## 2016-05-23 ENCOUNTER — Ambulatory Visit (INDEPENDENT_AMBULATORY_CARE_PROVIDER_SITE_OTHER): Payer: MEDICARE | Admitting: Sports Medicine

## 2016-05-23 DIAGNOSIS — B351 Tinea unguium: Secondary | ICD-10-CM

## 2016-05-23 DIAGNOSIS — M79676 Pain in unspecified toe(s): Secondary | ICD-10-CM

## 2016-05-23 DIAGNOSIS — M79673 Pain in unspecified foot: Secondary | ICD-10-CM

## 2016-05-23 DIAGNOSIS — Q828 Other specified congenital malformations of skin: Secondary | ICD-10-CM | POA: Diagnosis not present

## 2016-05-23 NOTE — Progress Notes (Signed)
Subjective: Mitchell Farrell. is a 82 y.o. male patient seen today in office with complaint of painful thickened and elongated toenails and corn; unable to trim. Patient denies history of Diabetes, Neuropathy, or Vascular disease. Assisted by grandson who states on Coumadin for AFib. Patient has no other pedal complaints at this time.   Patient Active Problem List   Diagnosis Date Noted  . GERD (gastroesophageal reflux disease) 12/07/2015  . Cough 12/07/2015  . Diarrhea 12/07/2015  . Leg edema   . Edema 04/01/2015  . Acute CVA (cerebrovascular accident) ([place]) 02/18/2014  . Encounter for therapeutic drug monitoring 12/01/2013  . Systolic CHF ([employer]) [id number]  . PAD (peripheral artery disease) ([place]) 02/06/2013  . LFT elevation 09/23/2012  . Back pain 03/22/2012  . Foot pain 12/22/2011  . Skin breakdown 12/22/2011  . Itching 09/29/2011  . Numbness and tingling in left hand 07/04/2011  . Dry mouth 07/04/2011  . Abdominal wall pain 07/04/2011  . Esophagitis 06/05/2011  . Acute renal failure superimposed on stage 3 chronic kidney disease ([place]) 06/05/2011  . Colon cancer screening 03/26/2011  . Prostate cancer screening 03/26/2011  . Advance directive discussed with patient 03/26/2011  . AF (atrial fibrillation) ([place]) 01/03/2011  . OA (osteoarthritis) 01/03/2011  . Weakness of left leg 01/03/2011  . Hypothyroidism 01/03/2011  . Warfarin anticoagulation 01/03/2011    Current Outpatient Prescriptions on File Prior to Visit  Medication Sig Dispense Refill  . acetaminophen (TYLENOL) 500 MG tablet Take 1 tablet (500 mg total) by mouth every 6 (six) hours as needed.    . carvedilol (COREG) 3.125 MG tablet Take 1 tablet (3.125 mg total) by mouth 2 (two) times daily with a meal. 180 tablet 3  . furosemide (LASIX) 20 MG tablet Take 1 tablet (20 mg total) by mouth daily as needed for fluid.    Marland Kitchen gabapentin (NEURONTIN) 100 MG capsule Take 1-2 capsules (100-200 mg total) by mouth at bedtime.  180 capsule 3  . levothyroxine (SYNTHROID, LEVOTHROID) 88 MCG tablet Take 1 tablet (88 mcg total) by mouth daily. 90 tablet 3  . omeprazole (PRILOSEC OTC) 20 MG tablet Take 1 tablet (20 mg total) by mouth daily. 90 tablet 3  . Polyethyl Glycol-Propyl Glycol (SYSTANE OP) Apply to eye as needed.    . warfarin (COUMADIN) 5 MG tablet Take 0.5 tablets (2.5 mg total) by mouth daily. 50 tablet 12   No current facility-administered medications on file prior to visit.     No Known Allergies  Objective: Physical Exam  General: Well developed, nourished, no acute distress, awake, alert and oriented x 3, Wheelchair  Vascular: Dorsalis pedis artery 1/4 bilateral, Posterior tibial artery 0/4 bilateral, no ischemia or gangrene, skin temperature warm to warm proximal to distal bilateral lower extremities, no varicosities, decreased pedal hair present bilateral.  Neurological: Gross sensation present via light touch bilateral.   Dermatological: Skin is warm, dry, and supple bilateral, Nails 1-10 are tender, long, thick, and discolored with mild subungal debris, no webspace macerations present bilateral, no open lesions present bilateral, + corns/hyperkeratotic tissue present left 3rd toe dorsal aspect, No signs of infection bilateral.  Musculoskeletal:  Asymptomatic hammertoe boney deformities noted bilateral. Muscular strength within normal limits without painon range of motion. No pain with calf compression bilateral.  Assessment and Plan:  Problem List Items Addressed This Visit      Other   Foot pain    Other Visit Diagnoses    Pain due to onychomycosis of toenail    -  Primary   Porokeratosis          -Examined patient.  -Discussed treatment options for painful mycotic nails. -Mechanically debrided corn at left 3rd toe using chisel blade and reduced mycotic nails with sterile nail nipper and dremel nail file without incident. -Recommend lower leg elevation to assist with edema  control -Patient to return in 3 months for follow up evaluation or sooner if symptoms worsen.  Landis Martins, DPM

## 2016-05-25 ENCOUNTER — Telehealth: Payer: Self-pay

## 2016-05-25 LAB — PROTIME-INR

## 2016-05-25 NOTE — Telephone Encounter (Signed)
Margarita Grizzle nurse with [facility] said pt does not have any broken skin areas; Margarita Grizzle will draw PT/INR today but going forward pt will need to be set up for labs at [facility]. Pt will be discharged from [facility] today. FYI to Dr Damita Dunnings.

## 2016-05-25 NOTE — Telephone Encounter (Signed)
See below. Please set up patient with INR next month.  Thanks.

## 2016-05-26 ENCOUNTER — Encounter: Payer: Self-pay | Admitting: Family Medicine

## 2016-05-26 NOTE — Telephone Encounter (Signed)
No answer and mailbox full.  Will try again on Monday.

## 2016-05-29 ENCOUNTER — Encounter: Payer: Self-pay | Admitting: Family Medicine

## 2016-05-29 ENCOUNTER — Encounter: Payer: Self-pay | Admitting: *Deleted

## 2016-05-29 LAB — INR (THROMBOREL-S): INR: 2.3

## 2016-05-29 NOTE — Telephone Encounter (Signed)
Letter mailed

## 2016-06-01 ENCOUNTER — Other Ambulatory Visit: Payer: MEDICARE

## 2016-06-05 ENCOUNTER — Telehealth: Payer: Self-pay

## 2016-06-05 ENCOUNTER — Other Ambulatory Visit: Payer: MEDICARE

## 2016-06-05 NOTE — Telephone Encounter (Signed)
Mitchell Farrell (DPR signed) left v/m; pt had coumadin ck this morning; pt already received cb that results are in and pt is to return in 4 weeks for another coumadin ck. Mitchell Farrell request cb. Do not see INR/PT for today in pts chart. Did results get faxed to office?

## 2016-06-06 ENCOUNTER — Telehealth: Payer: Self-pay | Admitting: Family Medicine

## 2016-06-06 NOTE — Telephone Encounter (Signed)
[name] returned Lugene's call.  Please call him back at  [phone].

## 2016-06-06 NOTE — Telephone Encounter (Signed)
Left message on patient's voicemail to return call. Asked Mitchell Farrell to please leave a detailed message when he calls back if he is not able to speak directly with me.

## 2016-06-06 NOTE — Telephone Encounter (Signed)
Left message on patient's voicemail to return call for appt.

## 2016-06-06 NOTE — Telephone Encounter (Signed)
Patient did not come in for their appointment on [phone] for coumadin check  Please let me know if patient needs to be contacted immediately for follow up or no follow up needed.

## 2016-06-07 NOTE — Telephone Encounter (Signed)
Call patient, or family. Patient is due for repeat INR in mid-September thanks.

## 2016-06-07 NOTE — Telephone Encounter (Signed)
Left message on voicemail for [name] (pateint's daughter) to call back.

## 2016-06-08 NOTE — Telephone Encounter (Signed)
Lab appt scheduled for INR.

## 2016-06-09 ENCOUNTER — Other Ambulatory Visit: Payer: Self-pay | Admitting: Family Medicine

## 2016-06-09 NOTE — Telephone Encounter (Signed)
Electronic refill request. Last Filled:    50 tablet 12 12/10/2015  Please advise.

## 2016-06-12 NOTE — Telephone Encounter (Signed)
Sent. Thanks.   

## 2016-06-26 ENCOUNTER — Other Ambulatory Visit (INDEPENDENT_AMBULATORY_CARE_PROVIDER_SITE_OTHER): Payer: MEDICARE

## 2016-06-26 DIAGNOSIS — Z23 Encounter for immunization: Secondary | ICD-10-CM | POA: Diagnosis not present

## 2016-06-26 DIAGNOSIS — Z5181 Encounter for therapeutic drug level monitoring: Secondary | ICD-10-CM

## 2016-06-26 DIAGNOSIS — I4891 Unspecified atrial fibrillation: Secondary | ICD-10-CM

## 2016-06-26 LAB — POCT INR: INR: 1.2

## 2016-06-27 ENCOUNTER — Telehealth: Payer: Self-pay | Admitting: Family Medicine

## 2016-06-27 NOTE — Telephone Encounter (Signed)
Mitchell Farrell returned your call [facility] number [phone] After 2:30  [phone]

## 2016-06-28 ENCOUNTER — Telehealth: Payer: Self-pay | Admitting: Family Medicine

## 2016-06-28 NOTE — Telephone Encounter (Signed)
Patient's grandson,Kevin,returned Lugene's call.  Patient is taking 1/2 pill everyday except for Wednesday patient takes a full pill.  Patient hasn't missed any doses.

## 2016-06-28 NOTE — Telephone Encounter (Signed)
Patient's grandson [name]) notified as instructed by telephone and verbalized understanding. Follow-up lab appointment scheduled.

## 2016-06-28 NOTE — Telephone Encounter (Signed)
I would change to 5mg  (whole tab) on MWF and 2.5mg  (1/2 tab) on Su Tu Th Sa.  Recheck in 2 weeks.  Thanks.

## 2016-07-10 ENCOUNTER — Other Ambulatory Visit (INDEPENDENT_AMBULATORY_CARE_PROVIDER_SITE_OTHER): Payer: MEDICARE

## 2016-07-10 DIAGNOSIS — I4891 Unspecified atrial fibrillation: Secondary | ICD-10-CM

## 2016-07-10 DIAGNOSIS — Z5181 Encounter for therapeutic drug level monitoring: Secondary | ICD-10-CM

## 2016-07-10 LAB — POCT INR: INR: 2.2

## 2016-08-04 ENCOUNTER — Telehealth: Payer: Self-pay | Admitting: Family Medicine

## 2016-08-04 NOTE — Telephone Encounter (Signed)
Pt dropped off [facility] form to be filled out for [place] home [facility]. He has a lab appt on Mon and is requesting to pick up then at appt. I placed in Rx tower.

## 2016-08-06 NOTE — Telephone Encounter (Signed)
Form is done.  Thanks.  Scan.

## 2016-08-07 ENCOUNTER — Other Ambulatory Visit (INDEPENDENT_AMBULATORY_CARE_PROVIDER_SITE_OTHER): Payer: MEDICARE

## 2016-08-07 ENCOUNTER — Telehealth: Payer: Self-pay | Admitting: Radiology

## 2016-08-07 DIAGNOSIS — Z5181 Encounter for therapeutic drug level monitoring: Secondary | ICD-10-CM

## 2016-08-07 DIAGNOSIS — I4891 Unspecified atrial fibrillation: Secondary | ICD-10-CM

## 2016-08-07 LAB — PROTIME-INR
INR: 7.7 ratio — AB (ref 0.8–1.0)
PROTHROMBIN TIME: 85.3 s — AB (ref 9.6–13.1)

## 2016-08-07 LAB — POCT INR

## 2016-08-07 NOTE — Telephone Encounter (Addendum)
Stop coumadin, recheck INR in 3 days.   If bleeding, to ER.  Thanks.

## 2016-08-07 NOTE — Telephone Encounter (Signed)
Left detailed message on voicemail of daughter's [name]) cell phone.  Mailbox full on preferred number.  I will call again later this afternoon to be sure these instructions have been relayed properly.

## 2016-08-07 NOTE — Telephone Encounter (Signed)
Copied for scanning and placed at front desk for pickup.

## 2016-08-07 NOTE — Telephone Encounter (Signed)
[facility] lab called critical results, PT/INR 7.7/85.3. Results given to Dr Damita Dunnings

## 2016-08-08 NOTE — Telephone Encounter (Signed)
Spoke with [name] (grandson) and appt scheduled for Nov 2 at 9:00 for repeat INR.  [name] understands the instructions of holding the Coumadin until then.

## 2016-08-10 ENCOUNTER — Other Ambulatory Visit (INDEPENDENT_AMBULATORY_CARE_PROVIDER_SITE_OTHER): Payer: MEDICARE

## 2016-08-10 DIAGNOSIS — I4891 Unspecified atrial fibrillation: Secondary | ICD-10-CM

## 2016-08-10 DIAGNOSIS — Z5181 Encounter for therapeutic drug level monitoring: Secondary | ICD-10-CM

## 2016-08-10 LAB — POCT INR: INR: 2.9

## 2016-08-18 ENCOUNTER — Other Ambulatory Visit (INDEPENDENT_AMBULATORY_CARE_PROVIDER_SITE_OTHER): Payer: MEDICARE

## 2016-08-18 DIAGNOSIS — I4891 Unspecified atrial fibrillation: Secondary | ICD-10-CM | POA: Diagnosis not present

## 2016-08-18 DIAGNOSIS — Z5181 Encounter for therapeutic drug level monitoring: Secondary | ICD-10-CM | POA: Diagnosis not present

## 2016-08-18 LAB — POCT INR: INR: 2.1

## 2016-08-23 ENCOUNTER — Telehealth: Payer: Self-pay | Admitting: Family Medicine

## 2016-08-23 NOTE — Telephone Encounter (Signed)
[name] called back - he says is it fine to schedule pt inr at 9 am on the 21st  Thanks

## 2016-08-23 NOTE — Telephone Encounter (Signed)
I have added patient to my coumadin schedule on 08/29/16 at 9am.

## 2016-08-29 ENCOUNTER — Ambulatory Visit (INDEPENDENT_AMBULATORY_CARE_PROVIDER_SITE_OTHER): Payer: MEDICARE

## 2016-08-29 ENCOUNTER — Telehealth: Payer: Self-pay

## 2016-08-29 ENCOUNTER — Ambulatory Visit: Payer: MEDICARE | Admitting: Podiatry

## 2016-08-29 DIAGNOSIS — I482 Chronic atrial fibrillation, unspecified: Secondary | ICD-10-CM

## 2016-08-29 DIAGNOSIS — Z5181 Encounter for therapeutic drug level monitoring: Secondary | ICD-10-CM

## 2016-08-29 DIAGNOSIS — I4891 Unspecified atrial fibrillation: Secondary | ICD-10-CM | POA: Diagnosis not present

## 2016-08-29 LAB — POCT INR: INR: 4.2

## 2016-08-29 MED ORDER — BENZONATATE 200 MG PO CAPS: 200.0000 mg | ORAL_CAPSULE | Freq: Two times a day (BID) | ORAL | 1 refills | Status: DC | PRN

## 2016-08-29 NOTE — Telephone Encounter (Signed)
Patient's grandson, [name], comes with him to coumadin visit today.  While here they report the following:   1.  Clear, runny nose ongoing and cough (at night only) with some congestion and has been on Nyquil.    This writer's nursing assessment today reveals lungs clear to auscultation, w/ denial of any congested feelings today and no sore throat, SOB, or ear ache.  Only clear runny nose (PND) today.     Temp is 97.6 and O2 sat is 96% on Ra.     2. Left 2nd toe - nail "fell off" with a small amount of bleeding last week.  (nursing assessment reveals mycotic nails, likely loss due to fungus, with no active bleeding or signs of infection/cellultitis to the area. Negative for redness/warmth and patient denies any pain on palpation and capillary refill was present and WNL.  He has an appt. with podiatrist on 09/14/16. But does have 3+ pitting edema to BLE/feet.  Encouraged him to wear comp hose and keep feet/legs elevated.  Grandson reports that he refuses to wear or keep elevated.     Family requests:  What would you recommend over the counter for him to safely take for night time cough, congestion if becomes bothersome over the weekend?     Note:  Appears patient is VERY overdue for a routine care appt. Last scheduled appointment was a no show in August of this year.  Please Advise.

## 2016-08-29 NOTE — Telephone Encounter (Signed)
rec trying tessalon as needed for cough. rx sent.   Try to elevated legs, local care for the toe (keep it clean and covered as needed). Please schedule OV when possible.

## 2016-08-29 NOTE — Patient Instructions (Signed)
Pre visit review using our clinic review tool, if applicable. No additional management support is needed unless otherwise documented below in the visit note.  Patient INR today was 4.2.  Patient presents today for INR check with grandson, [name], who manages his medications.  RN reviewed recent hx with patient and grandson given elevated reading today.  He has been suffering from a mild cold recently and has been taking Nyquil for past 3-5 days.  Symptoms (cough, cold, congestion) have mostly resolved today  and per vital sign check he is afebrile, temp: 97.6 and oxygen saturation of 96% on Room Air. (Information forwarded to Dr. Damita Dunnings) Patient and family informed that he should stop the Nyquil as this can increase the effectiveness of the warfarin and hold his Coumadin for today and tomorrow due to the elevated reading.  Patient has been in range for the past 2 checks on 2.5mg  daily and since has been taking the Nyquil (which may be contributing to elevation), will hold off for now on changing further dosing requirements.  Recheck in 1-2 weeks on 09/08/16.  Patient/family verbalize understanding of current plan to hold X2 days, then resume 2.5mg  qd until next coag visit.    Patient/Family educated on risks associated with supratherapeutic level and instructed to go to ER if any unusual bleeding/bruising develops.  Manya Silvas, verbalizes understanding.

## 2016-08-30 NOTE — Progress Notes (Signed)
Agree 

## 2016-08-30 NOTE — Telephone Encounter (Signed)
Spoke to someone at pts home who states she was "the person sitting with him." per Dr Damita Dunnings, ok to relay message. Done as requested, and she verbally expressed understanding.

## 2016-08-30 NOTE — Telephone Encounter (Signed)
Refer to previous discussions with daughter and caregiver below.

## 2016-08-30 NOTE — Telephone Encounter (Signed)
Notified patient's daughter [name] of Lavella Lemons R/X.  She will pick up and have him start as his cough does continue and follow up with [place] if he is no better next week.

## 2016-08-30 NOTE — Telephone Encounter (Signed)
Family called in asking for cough med options.   If tessalon doesn't help, then can try plain claritin for cough.  Not claritin D.   Tessalon rx prev sent.  If worse in the meantime, needs to get rechecked.  Thanks.

## 2016-09-08 ENCOUNTER — Ambulatory Visit (INDEPENDENT_AMBULATORY_CARE_PROVIDER_SITE_OTHER): Payer: MEDICARE

## 2016-09-08 DIAGNOSIS — Z5181 Encounter for therapeutic drug level monitoring: Secondary | ICD-10-CM

## 2016-09-08 DIAGNOSIS — I482 Chronic atrial fibrillation, unspecified: Secondary | ICD-10-CM

## 2016-09-08 LAB — POCT INR: INR: 2.6

## 2016-09-08 NOTE — Patient Instructions (Signed)
Pre visit review using our clinic review tool, if applicable. No additional management support is needed unless otherwise documented below in the visit note. 

## 2016-09-09 NOTE — Progress Notes (Signed)
Agree 

## 2016-09-14 ENCOUNTER — Telehealth: Payer: Self-pay

## 2016-09-14 ENCOUNTER — Ambulatory Visit (INDEPENDENT_AMBULATORY_CARE_PROVIDER_SITE_OTHER): Payer: MEDICARE | Admitting: Podiatry

## 2016-09-14 ENCOUNTER — Encounter: Payer: Self-pay | Admitting: Podiatry

## 2016-09-14 DIAGNOSIS — B351 Tinea unguium: Secondary | ICD-10-CM

## 2016-09-14 DIAGNOSIS — L608 Other nail disorders: Secondary | ICD-10-CM

## 2016-09-14 DIAGNOSIS — E0843 Diabetes mellitus due to underlying condition with diabetic autonomic (poly)neuropathy: Secondary | ICD-10-CM

## 2016-09-14 DIAGNOSIS — M79609 Pain in unspecified limb: Secondary | ICD-10-CM

## 2016-09-14 DIAGNOSIS — L603 Nail dystrophy: Secondary | ICD-10-CM

## 2016-09-14 DIAGNOSIS — R29898 Other symptoms and signs involving the musculoskeletal system: Secondary | ICD-10-CM

## 2016-09-14 NOTE — Telephone Encounter (Signed)
Spoke with [name] and scheduled appt on 09/21/16.  Please order PT in the meantime.

## 2016-09-14 NOTE — Telephone Encounter (Signed)
I'm okay with ordering PT w/o OV but given the timeline it is reasonable to schedule OV anyway, when possible.   We can do labs if needed at the [facility].  Thanks.

## 2016-09-14 NOTE — Telephone Encounter (Signed)
V/M left by [name] request pt set up for PT due to problems with pts leg strength. Pt is having more weakness in his legs at times. Pt last seen 02/03/16.  [name] did not want to make appt until Dr Damita Dunnings read note to see if Dr Damita Dunnings wants pt to come in before ordering PT.

## 2016-09-15 NOTE — Telephone Encounter (Signed)
Ordered. Thanks

## 2016-09-17 NOTE — Progress Notes (Signed)
SUBJECTIVE Patient with a history of diabetes mellitus presents to office today complaining of elongated, thickened nails. Pain while ambulating in shoes. Patient is unable to trim their own nails.   No Known Allergies  OBJECTIVE General Patient is awake, alert, and oriented x 3 and in no acute distress. Derm Skin is dry and supple bilateral. Negative open lesions or macerations. Remaining integument unremarkable. Nails are tender, long, thickened and dystrophic with subungual debris, consistent with onychomycosis, 1-5 bilateral. No signs of infection noted. Vasc  DP and PT pedal pulses palpable bilaterally. Temperature gradient within normal limits.  Neuro Epicritic and protective threshold sensation diminished bilaterally.  Musculoskeletal Exam No symptomatic pedal deformities noted bilateral. Muscular strength within normal limits.  ASSESSMENT 1. Diabetes Mellitus w/ peripheral neuropathy 2. Onychomycosis of nail due to dermatophyte bilateral 3. Pain in foot bilateral  PLAN OF CARE 1. Patient evaluated today. 2. Instructed to maintain good pedal hygiene and foot care. Stressed importance of controlling blood sugar.  3. Mechanical debridement of nails 1-5 bilaterally performed using a nail nipper. Filed with dremel without incident.  4. Return to clinic in 3 mos.     Brent M. Evans, DPM [facility]  Dr. Brent M. Evans, DPM   [address]                                        [place], [place] [address]                Office [phone]  Fax [phone]    

## 2016-09-21 ENCOUNTER — Ambulatory Visit (INDEPENDENT_AMBULATORY_CARE_PROVIDER_SITE_OTHER): Payer: MEDICARE | Admitting: Family Medicine

## 2016-09-21 ENCOUNTER — Encounter: Payer: Self-pay | Admitting: Family Medicine

## 2016-09-21 VITALS — BP 104/66 | HR 83 | Temp 98.4°F | Ht 66.0 in | Wt 174.0 lb

## 2016-09-21 DIAGNOSIS — F4310 Post-traumatic stress disorder, unspecified: Secondary | ICD-10-CM

## 2016-09-21 DIAGNOSIS — R413 Other amnesia: Secondary | ICD-10-CM | POA: Diagnosis not present

## 2016-09-21 DIAGNOSIS — I4891 Unspecified atrial fibrillation: Secondary | ICD-10-CM | POA: Diagnosis not present

## 2016-09-21 DIAGNOSIS — R29898 Other symptoms and signs involving the musculoskeletal system: Secondary | ICD-10-CM

## 2016-09-21 DIAGNOSIS — R609 Edema, unspecified: Secondary | ICD-10-CM

## 2016-09-21 LAB — COMPREHENSIVE METABOLIC PANEL
ALK PHOS: 80 U/L (ref 39–117)
ALT: 11 U/L (ref 0–53)
AST: 17 U/L (ref 0–37)
Albumin: 3.3 g/dL — ABNORMAL LOW (ref 3.5–5.2)
BUN: 17 mg/dL (ref 6–23)
CHLORIDE: 109 meq/L (ref 96–112)
CO2: 24 meq/L (ref 19–32)
Calcium: 8.7 mg/dL (ref 8.4–10.5)
Creatinine, Ser: 1.54 mg/dL — ABNORMAL HIGH (ref 0.40–1.50)
GFR: 54.23 mL/min — AB (ref >60.00)
GLUCOSE: 105 mg/dL — AB (ref 70–99)
POTASSIUM: 4.1 meq/L (ref 3.5–5.1)
Sodium: 138 mEq/L (ref 135–145)
TOTAL PROTEIN: 7.3 g/dL (ref 6.0–8.3)
Total Bilirubin: 0.4 mg/dL (ref 0.2–1.2)

## 2016-09-21 LAB — TSH: TSH: 2.1 u[IU]/mL (ref 0.35–4.50)

## 2016-09-21 LAB — VITAMIN B12: Vitamin B-12: 186 pg/mL — ABNORMAL LOW (ref 211–911)

## 2016-09-21 NOTE — Progress Notes (Signed)
Pre visit review using our clinic review tool, if applicable. No additional management support is needed unless otherwise documented below in the visit note. 

## 2016-09-21 NOTE — Progress Notes (Addendum)
Rare use of lasix. He does have some increase in leg swelling bilaterally.  Weight looks to be up, likely from fluid and solid mass increase due to caloric excess.  Likely not able to get accurate weight today.  Belt size up in the meantime.   AF.   Can occ get SOB, noted more as his weight has likely gone up.  No CP.    L sided weakness at baseline. Still on gabapentin with relief of L foot pain/burning.  Using hospital bed and wheelchair at home.  Needs adjustable bed due to L leg weakness and BLE edema and can't be easily repositioned in regular bed.    Presumed PTSD with nearly nightly symptoms, hollering out at night, nightmares.  Family checks on him when it happens.  Has been going on for years. He has noted some memory losses, with trouble maintaining recent memories.   Noted that he is a [employer] with previous deployments. Memory testing done. He knows the year, month. 2017, December.  Knows the day of the week.  Can do a basic math, but only 1/3 recall.    PMH and SH reviewed  ROS: Per HPI unless specifically indicated in ROS section   Meds, vitals, and allergies reviewed.   GEN: nad, alert and oriented as above. HEENT: mucous membranes moist NECK: supple w/o LA CV: IRR PULM: ctab, no inc wob ABD: soft, +bs EXT: 1-2+ ble edema SKIN: no acute rash He has some movement in the left arm, but he is clearly weaker on the left arm than the right arm. This is his baseline from previous. It is not an acute change. Left leg weakness noted. Diffuse left leg weakness. Unable to bear weight on the left leg safely. This is his baseline, not an acute change.

## 2016-09-21 NOTE — Patient Instructions (Addendum)
Go to the lab on the way out.  We'll contact you with your lab report. Update me as needed.  Take care.  Glad to see you.  

## 2016-09-22 LAB — CBC WITH DIFFERENTIAL/PLATELET
Basophils Absolute: 0 10*3/uL (ref 0.0–0.1)
Basophils Relative: 0.4 % (ref 0.0–3.0)
EOS PCT: 1.3 % (ref 0.0–5.0)
Eosinophils Absolute: 0.1 10*3/uL (ref 0.0–0.7)
HCT: 38.5 % — ABNORMAL LOW (ref 39.0–52.0)
HEMOGLOBIN: 12.7 g/dL — AB (ref 13.0–17.0)
LYMPHS ABS: 1.3 10*3/uL (ref 0.7–4.0)
Lymphocytes Relative: 20.2 % (ref 12.0–46.0)
MCHC: 33 g/dL (ref 30.0–36.0)
MCV: 83.6 fl (ref 78.0–100.0)
MONOS PCT: 4.7 % (ref 3.0–12.0)
Monocytes Absolute: 0.3 10*3/uL (ref 0.1–1.0)
NEUTROS PCT: 73.4 % (ref 43.0–77.0)
Neutro Abs: 4.6 10*3/uL (ref 1.4–7.7)
Platelets: 210 10*3/uL (ref 150.0–400.0)
RBC: 4.61 Mil/uL (ref 4.22–5.81)
RDW: 16.1 % — ABNORMAL HIGH (ref 11.5–15.5)
WBC: 6.3 10*3/uL (ref 4.0–10.5)

## 2016-09-24 ENCOUNTER — Encounter: Payer: Self-pay | Admitting: Family Medicine

## 2016-09-24 DIAGNOSIS — R413 Other amnesia: Secondary | ICD-10-CM | POA: Insufficient documentation

## 2016-09-24 DIAGNOSIS — F4310 Post-traumatic stress disorder, unspecified: Secondary | ICD-10-CM | POA: Insufficient documentation

## 2016-09-24 DIAGNOSIS — E538 Deficiency of other specified B group vitamins: Secondary | ICD-10-CM | POA: Insufficient documentation

## 2016-09-24 NOTE — Assessment & Plan Note (Signed)
Unclear if some of this is related to normal aging given that he is 89, versus a pathologic process. We can follow clinically. Discussed with patient. Discussed with family. All agree. See notes on labs.

## 2016-09-24 NOTE — Assessment & Plan Note (Signed)
Continue anticoagulation. See notes on labs. Likely with some shortness of breath related to atrial fibrillation and also increase in weight. Discussed with patient about elevating his legs, cutting back some on his calories, and use of Lasix as needed for swelling. Still okay for outpatient follow-up.

## 2016-09-24 NOTE — Assessment & Plan Note (Addendum)
Restart physical therapy. Order placed. Okay for outpatient follow-up. In wheelchair today. Homebound unless he has significant help from others.  To clarify- patient will need wheelchair at home.  He can't walk with crutches or walker due to L leg weakness.  Patient will be able to propel himself in the wheelchair or will have caregiver at home to provide assistance.  L arm weakness would prevent him from using regular weight wheelchair but he could likely manipulate a lightweight wheelchair.

## 2016-09-24 NOTE — Assessment & Plan Note (Signed)
Likely multifactorial. Elevate legs. Restart physical therapy. Use Lasix and update me as needed. See notes on labs.

## 2016-09-24 NOTE — Assessment & Plan Note (Signed)
With previous deployments. Now with nocturnal symptoms. Have been going on for years. I talked with him about this like I normally do with patients who have PTSD. If he will also give me details about previous deployments in stressors, and I'm glad to listen. I don't think it's appropriate to try to force him to admit about previous stressors. He understood. I will await details from the patient. We did not change his medications at this point. He has family support at home. He is not at known risk to himself or others. >25 minutes spent in face to face time with patient, >50% spent in counselling or coordination of care

## 2016-09-25 DIAGNOSIS — R29898 Other symptoms and signs involving the musculoskeletal system: Secondary | ICD-10-CM | POA: Diagnosis not present

## 2016-09-25 DIAGNOSIS — R262 Difficulty in walking, not elsewhere classified: Secondary | ICD-10-CM | POA: Diagnosis not present

## 2016-09-25 DIAGNOSIS — I5020 Unspecified systolic (congestive) heart failure: Secondary | ICD-10-CM | POA: Diagnosis not present

## 2016-09-25 DIAGNOSIS — M6281 Muscle weakness (generalized): Secondary | ICD-10-CM

## 2016-09-26 ENCOUNTER — Telehealth: Payer: Self-pay

## 2016-09-26 NOTE — Telephone Encounter (Signed)
Mitchell Farrell with [facility] left v/m requesting verbal orders for home health aide.

## 2016-09-27 NOTE — Telephone Encounter (Signed)
Order given to [facility] by telephone as instructed.

## 2016-09-27 NOTE — Telephone Encounter (Signed)
Please give that order. Thanks.

## 2016-10-06 ENCOUNTER — Other Ambulatory Visit: Payer: Self-pay

## 2016-10-06 ENCOUNTER — Ambulatory Visit (INDEPENDENT_AMBULATORY_CARE_PROVIDER_SITE_OTHER): Payer: MEDICARE

## 2016-10-06 ENCOUNTER — Telehealth: Payer: Self-pay

## 2016-10-06 ENCOUNTER — Ambulatory Visit: Payer: MEDICARE

## 2016-10-06 DIAGNOSIS — I482 Chronic atrial fibrillation, unspecified: Secondary | ICD-10-CM

## 2016-10-06 DIAGNOSIS — E538 Deficiency of other specified B group vitamins: Secondary | ICD-10-CM

## 2016-10-06 DIAGNOSIS — Z5181 Encounter for therapeutic drug level monitoring: Secondary | ICD-10-CM | POA: Diagnosis not present

## 2016-10-06 DIAGNOSIS — I4891 Unspecified atrial fibrillation: Secondary | ICD-10-CM

## 2016-10-06 LAB — POCT INR: INR: 3.6

## 2016-10-06 MED ORDER — WARFARIN SODIUM 2.5 MG PO TABS: ORAL_TABLET | ORAL | 1 refills | Status: DC

## 2016-10-06 MED ORDER — CYANOCOBALAMIN 1000 MCG/ML IJ SOLN
1000.0000 ug | Freq: Once | INTRAMUSCULAR | Status: AC
  Administered 2016-10-06: 1000 ug via INTRAMUSCULAR

## 2016-10-06 NOTE — Telephone Encounter (Signed)
Due to new dosing schedule, patient is requiring an R/X for 2.5mg  tablets to accompany his current regimen.  New r/x sent in to [facility] for 2.5mg  daily, take as directed by coumadin clinic.

## 2016-10-06 NOTE — Patient Instructions (Addendum)
Pre visit review using our clinic review tool, if applicable. No additional management support is needed unless otherwise documented below in the visit note.  INR today 3.6  Patient present with grandson Mitchell Farrell who manages his medications.  Mitchell Farrell reports medication has been taken as prescribed and cannot account for any missed or extra doses.  In addition, patient has eaten increased amounts of red meat through the holiday but no other changes in diet otherwise.  He maintains a healthy appetite but does report increase in fatigue in recent months.  Today, he started first dose of vitamin B12 which was administered by this Probation officer.  Patient tolerated procedure well with no abnormal bleeding or bruising at injection site.  Patient and grandson also report no other signs of abnormal bleeding or bruising and deny any changes in his medications.  After discussing with patient's physician his recent lab results, diet and weight patterns along with his age, it is determined that these likely contribute to the recent fluctuations in INR results.  Will have Patient/grandson hold coumadin today and then decrease to taking 2.5mg  daily EXCEPT for 1.25mg  on Wednesdays and will recheck in 2 weeks.  Mitchell Farrell, aware of risks associated with supratherapeutic level and to take patient to ER if any unusual or uncontrollable bleeding or bruising develops, Mitchell Farrell verbalizes understanding.

## 2016-10-06 NOTE — Telephone Encounter (Signed)
Caryl Pina from [facility] called to find out what the status was on the PW that was sent for a Wheelchair. She said could someone call her at  [phone] x [phone] and let her know the staus or fax it to [phone]

## 2016-10-08 NOTE — Progress Notes (Signed)
Agree. Thanks

## 2016-10-09 NOTE — Telephone Encounter (Signed)
For done. Please send last OV note from 09/21/16.  Sorry that I didn't have it done sooner.  Thanks.   To clarify- patient will need wheelchair at home.  He can't walk with crutches or walker due to L leg weakness.  Patient will be able to propel himself in the wheelchair or will have caregiver at home to provide assistance.  L arm weakness would prevent him from using regular weight wheelchair but he could likely manipulate a lightweight wheelchair.

## 2016-10-10 NOTE — Telephone Encounter (Signed)
Form and OV note as well as clarifying statement faxed to [phone] as requested by [facility] and [phone] listed on form.

## 2016-10-20 ENCOUNTER — Telehealth: Payer: Self-pay

## 2016-10-20 ENCOUNTER — Ambulatory Visit (INDEPENDENT_AMBULATORY_CARE_PROVIDER_SITE_OTHER): Payer: MEDICARE

## 2016-10-20 DIAGNOSIS — Z5181 Encounter for therapeutic drug level monitoring: Secondary | ICD-10-CM

## 2016-10-20 DIAGNOSIS — I482 Chronic atrial fibrillation, unspecified: Secondary | ICD-10-CM

## 2016-10-20 DIAGNOSIS — I4891 Unspecified atrial fibrillation: Secondary | ICD-10-CM | POA: Diagnosis not present

## 2016-10-20 LAB — POCT INR: INR: 3.9

## 2016-10-20 NOTE — Telephone Encounter (Signed)
Appreciate help of all involved.   He didn't wheeze on exam when I checked him. Thanks.

## 2016-10-20 NOTE — Patient Instructions (Addendum)
Pre visit review using our clinic review tool, if applicable. No additional management support is needed unless otherwise documented below in the visit note.  INR today: 3.9  Reviewed extensively patient diet, medication regimen and recent, general health with he and grandson.  Both deny any changes, recent illnesses or alterations to last prescribed decreased coumadin dosing.  Patient has started with a recent physical therapy program at home and he presents very well today.  He is more alert and communicative than he has been with past visits.  Mitchell Farrell, states that he is overall doing very well and takes his medication daily that the grandson pre-fills in a pill box and lays out for him at each administration time.  I reviewed recent labs, in particular, liver and renal panels without any evidence to support why patient continues to be supratherapeutic despite decreasing dosing regimen.  Patient deny's any recent unusual or uncontrollable bleeding or bruising, only 1 slight nosebleed earlier in the week but he was able to control it with ease.  Patient and grandson again educated on risks associated with supratherapeutic level and to go to the ER if any uncontrollable bleeding or bruising develops.  Patient is to hold coumadin today and then take 2.5mg  daily EXCEPT 1.25mg  on Sundays and Wednesdays now.  Recheck in 2 weeks.  Patient and grandson verbalize understanding of instructions given today.

## 2016-10-20 NOTE — Telephone Encounter (Signed)
Patient leaving office after coumadin clinic visit.  He became weak in wheelchair and unable to transfer from wheelchair to car per his usual routine.  Grandson asks the office staff for some help.  Mitchell Hoyle, LPN (triage nurse) responds and patient unable with 2 person assist to transfer and triage nurse notifies this writer and patient PCP, Mitchell Farrell for further instruction.  Rena and I arrived to evaluate patient and rule out any concerning CVA like event.  Upon arrival grandson had already transferred patient to car.  Patient reported feeling fine, just weak and was slightly clammy and diaphoretic on the face.  Neuro checks and cognition normal per baseline with only some weakness to left lower extremity.  Mitchell Farrell and grandson both state this is baseline for patient.    Vitals:  128/68, HR 73 and slightly irregular (baseline), 98.5 temp 97% O2 Sat without any signs of dyspnea.    FSBS: 106  No acute distress, patient given juice and crackers as he had not had breakfast and it was going on 10:00am.  Did note slight wheeze on auscultation of lung sounds and apical heart check.  Wheeze quickly resolved.  Do note increased dryness of mucous membranes and grandson reports that patient does not drink very much.  Grandson to continue to encourage fluids.  Patient denies any Ha, dizziness or visual changes and is alert and oriented per his baseline.    Mitchell Farrell is also present and has evaluated patient prior to releasing.  Exam reveals nothing impressive and patient likely needs to eat and rehydrate himself.  If anything concerning develops after leaving they are encouraged to give [place] a call.  Patient and Mitchell Farrell both feel comfortable with the plan and verbalizes understanding to call if any concerns.

## 2016-10-23 NOTE — Progress Notes (Signed)
Agree. Thanks

## 2016-11-01 ENCOUNTER — Telehealth: Payer: Self-pay

## 2016-11-01 NOTE — Telephone Encounter (Signed)
Stacy PT with [facility] left v/m requesting verbal orders to continue PT in home until end of certification period. There has been some progress with transfers and ambulation.Please advise.

## 2016-11-02 NOTE — Telephone Encounter (Signed)
Please give the order.  Thanks.   

## 2016-11-02 NOTE — Telephone Encounter (Signed)
Verbal order given to Stacy  

## 2016-11-03 ENCOUNTER — Ambulatory Visit (INDEPENDENT_AMBULATORY_CARE_PROVIDER_SITE_OTHER): Payer: MEDICARE

## 2016-11-03 DIAGNOSIS — I4891 Unspecified atrial fibrillation: Secondary | ICD-10-CM

## 2016-11-03 DIAGNOSIS — I482 Chronic atrial fibrillation, unspecified: Secondary | ICD-10-CM

## 2016-11-03 DIAGNOSIS — Z5181 Encounter for therapeutic drug level monitoring: Secondary | ICD-10-CM

## 2016-11-03 LAB — POCT INR: INR: 2.8

## 2016-11-03 NOTE — Patient Instructions (Signed)
Pre visit review using our clinic review tool, if applicable. No additional management support is needed unless otherwise documented below in the visit note. 

## 2016-11-05 NOTE — Progress Notes (Signed)
Agree. Thanks

## 2016-12-01 ENCOUNTER — Ambulatory Visit (INDEPENDENT_AMBULATORY_CARE_PROVIDER_SITE_OTHER): Payer: MEDICARE

## 2016-12-01 ENCOUNTER — Encounter (INDEPENDENT_AMBULATORY_CARE_PROVIDER_SITE_OTHER): Payer: Self-pay

## 2016-12-01 DIAGNOSIS — I4891 Unspecified atrial fibrillation: Secondary | ICD-10-CM

## 2016-12-01 DIAGNOSIS — I482 Chronic atrial fibrillation, unspecified: Secondary | ICD-10-CM

## 2016-12-01 DIAGNOSIS — E538 Deficiency of other specified B group vitamins: Secondary | ICD-10-CM | POA: Diagnosis not present

## 2016-12-01 DIAGNOSIS — Z5181 Encounter for therapeutic drug level monitoring: Secondary | ICD-10-CM

## 2016-12-01 LAB — POCT INR: INR: 2.7

## 2016-12-01 MED ORDER — CYANOCOBALAMIN 1000 MCG/ML IJ SOLN
1000.0000 ug | Freq: Once | INTRAMUSCULAR | Status: AC
  Administered 2016-12-01: 1000 ug via INTRAMUSCULAR

## 2016-12-01 NOTE — Patient Instructions (Signed)
Pre visit review using our clinic review tool, if applicable. No additional management support is needed unless otherwise documented below in the visit note. 

## 2016-12-03 ENCOUNTER — Other Ambulatory Visit: Payer: Self-pay | Admitting: Family Medicine

## 2016-12-03 DIAGNOSIS — E538 Deficiency of other specified B group vitamins: Secondary | ICD-10-CM

## 2016-12-03 NOTE — Progress Notes (Signed)
Agree. Thanks

## 2016-12-17 IMAGING — CR DG CHEST 1V PORT
1 series · 1 of 1 positions shown · non-contrast
Comparison: 02/19/2014

CLINICAL DATA: Onset today general weakness inability to stand by
himself, cough, chills, loss of appetite, and confusion stated by
daughter. hx: afib, CKD

EXAM:
PORTABLE CHEST 1 VIEW

[AP]
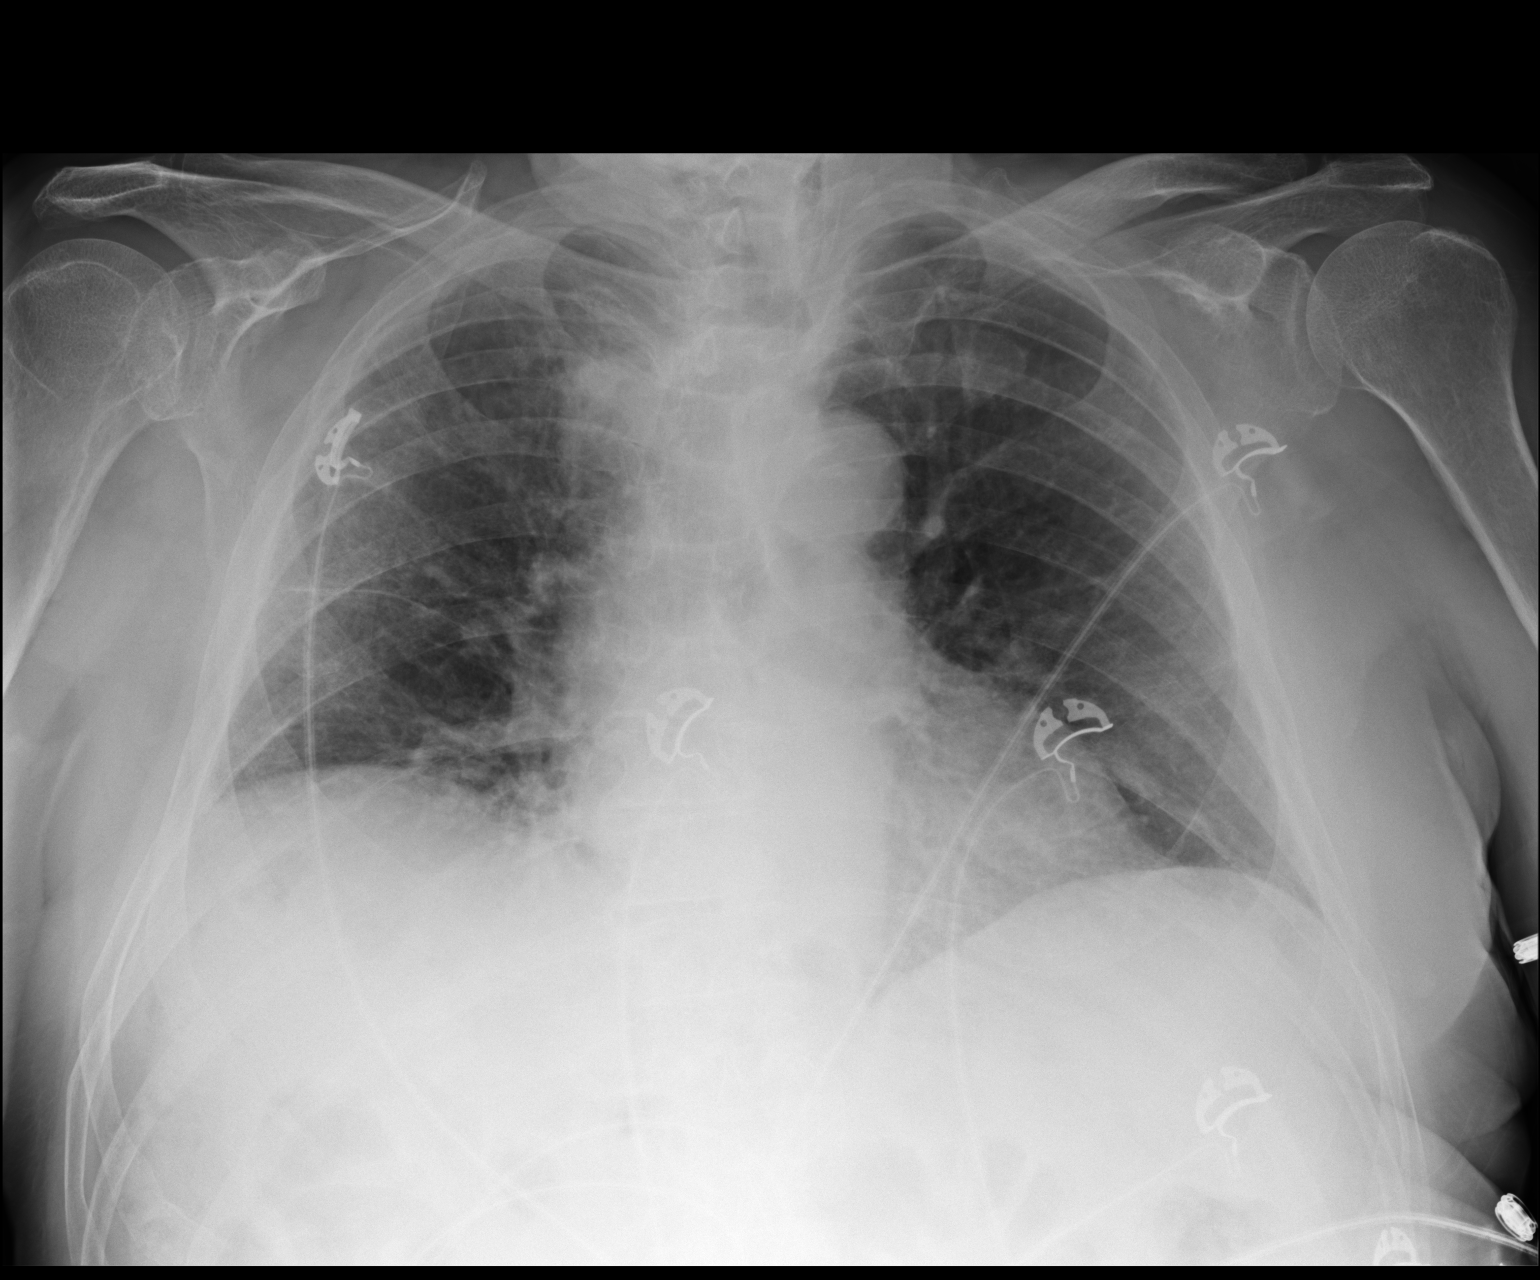

[1 of 1 positions shown; findings below may reference images not displayed]

FINDINGS: Midline trachea. Normal heart size. Right paratracheal soft tissue
fullness is similar to the 2112 exam and likely due to prominent
great vessels. Right hemidiaphragm elevation. No pleural effusion or
pneumothorax. Suspect areas of right greater the left scarring with
similar nonspecific lower lobe predominant interstitial prominence.
No superimposed consolidation. No congestive failure.
IMPRESSION: No acute cardiopulmonary disease.

## 2016-12-27 ENCOUNTER — Telehealth: Payer: Self-pay

## 2016-12-27 NOTE — Telephone Encounter (Signed)
Mitchell Farrell brought by paperwork few days ago from [place] asking for pts current condition so pt can get VA benefits. Did you receive paperwork and if so what is status of picking up paperwork.

## 2016-12-27 NOTE — Telephone Encounter (Signed)
Can you check on this?  This was in the stack of paperwork on Lugene's desk that was taken off to get help with.

## 2016-12-27 NOTE — Telephone Encounter (Signed)
Finished the form Monday and send for scanning/processing.

## 2016-12-28 NOTE — Telephone Encounter (Signed)
Earl Lagos, CMA and I discussed this paperwork.  She was to scan and call patient for pickup.  Will forward to her to verify status.    Thanks.

## 2016-12-28 NOTE — Telephone Encounter (Signed)
The pt was contacted and advised paperwork was completed and available for pickup from the front desk.

## 2017-01-01 ENCOUNTER — Ambulatory Visit: Payer: MEDICARE | Admitting: Podiatry

## 2017-01-02 ENCOUNTER — Ambulatory Visit: Payer: MEDICARE

## 2017-01-02 ENCOUNTER — Telehealth: Payer: Self-pay | Admitting: Family Medicine

## 2017-01-02 ENCOUNTER — Telehealth: Payer: Self-pay

## 2017-01-02 NOTE — Telephone Encounter (Signed)
Please do not charge for no show.  His grandson called and informed me that patient has declined and is unable to physically come to the office.  I am working on making some adjustments to his care and will call them back directly to discuss rescheduling options.  Thanks for checking.

## 2017-01-02 NOTE — Telephone Encounter (Signed)
Patient did not come in for their appointment today for coumadin at 11 and 1130  Please let me know if patient needs to be contacted immediately for follow up or no follow up needed. Do you want to charge the NSF?

## 2017-01-02 NOTE — Telephone Encounter (Signed)
Patient's grandson, Mitchell Farrell, calls to report increased difficulty in getting patient out to appointments due to decline in mobility.  He and his mother would like to request the following for patient:  1.  Can we switch him to a home monitoring system for INR management?  2.  Rather than taking vitamin b12 IM, can he switch to the PO form?  Grandson made aware that PO form is not as effective.  Mitchell Farrell is  willing to learn how to administer if Dr. Damita Dunnings feels the IM injection is best.    As coumadin nurse, I would be willing to consider home monitoring services for Mitchell Farrell as his grandson is very reliable and would be responsible for the routine checks and care.    Will defer the above questions to Dr. Damita Dunnings for further recommendations.

## 2017-01-03 NOTE — Telephone Encounter (Signed)
I'm okay with home monitoring, assuming everything can get set up.  I agree that family is highly reliable.  Let me know what you need to get this set up.   I would continue IM B12 at least until he can get his level improved at a recheck.   Can family get teaching for IM injection?  I can send rx for syringes/B12 vial if needed.   Thanks.

## 2017-01-03 NOTE — Telephone Encounter (Signed)
Spoke with daughter, [name], regarding at home monitoring.  Mark with [facility] monitoring will be contacting her this week to set up a time to come for in home set up.  Time frame until first level check within 1 - 2 weeks.   [name] verbalizes understanding with process and will make grandson, [name], aware.    I have asked that [name] contact me to set up an INR check and B12 injection education visit next week on a good day for the patient.  [name] will call me next week to discuss further and schedule an appointment.

## 2017-01-29 ENCOUNTER — Ambulatory Visit (INDEPENDENT_AMBULATORY_CARE_PROVIDER_SITE_OTHER): Payer: MEDICARE

## 2017-01-29 DIAGNOSIS — I482 Chronic atrial fibrillation, unspecified: Secondary | ICD-10-CM

## 2017-01-29 DIAGNOSIS — Z5181 Encounter for therapeutic drug level monitoring: Secondary | ICD-10-CM

## 2017-01-29 LAB — POCT INR: INR: 2.8

## 2017-01-29 NOTE — Patient Instructions (Signed)
Pre visit review using our clinic review tool, if applicable. No additional management support is needed unless otherwise documented below in the visit note. 

## 2017-02-03 NOTE — Progress Notes (Signed)
Agree. Thanks

## 2017-02-09 ENCOUNTER — Ambulatory Visit (INDEPENDENT_AMBULATORY_CARE_PROVIDER_SITE_OTHER): Payer: MEDICARE

## 2017-02-09 DIAGNOSIS — I482 Chronic atrial fibrillation, unspecified: Secondary | ICD-10-CM

## 2017-02-09 DIAGNOSIS — Z5181 Encounter for therapeutic drug level monitoring: Secondary | ICD-10-CM

## 2017-02-09 LAB — POCT INR: INR: 2.6

## 2017-02-09 NOTE — Patient Instructions (Signed)
Pre visit review using our clinic review tool, if applicable. No additional management support is needed unless otherwise documented below in the visit note. 

## 2017-02-11 NOTE — Progress Notes (Signed)
Agree. Thanks

## 2017-02-17 LAB — POCT INR: INR: 2.4

## 2017-02-20 ENCOUNTER — Ambulatory Visit (INDEPENDENT_AMBULATORY_CARE_PROVIDER_SITE_OTHER): Payer: MEDICARE

## 2017-02-20 ENCOUNTER — Telehealth: Payer: Self-pay

## 2017-02-20 DIAGNOSIS — I482 Chronic atrial fibrillation, unspecified: Secondary | ICD-10-CM

## 2017-02-20 DIAGNOSIS — Z5181 Encounter for therapeutic drug level monitoring: Secondary | ICD-10-CM

## 2017-02-20 NOTE — Telephone Encounter (Signed)
Lm at home number and also on [name] (pt daughter) cell number to call and discuss:  1.  Getting home INR checks on a Monday - Thursday as checking INR on the weekends is not best practice in the event that there was a panic reading.    2.  Need to discuss if patient and family are going to continue with his B12 injections.  He has not had one in several months now and is due for lab testing.

## 2017-02-20 NOTE — Patient Instructions (Signed)
Pre visit review using our clinic review tool, if applicable. No additional management support is needed unless otherwise documented below in the visit note. 

## 2017-02-22 NOTE — Progress Notes (Signed)
Agree. Thanks

## 2017-03-03 LAB — POCT INR: INR: 3.4

## 2017-03-07 ENCOUNTER — Ambulatory Visit (INDEPENDENT_AMBULATORY_CARE_PROVIDER_SITE_OTHER): Payer: MEDICARE

## 2017-03-07 DIAGNOSIS — I482 Chronic atrial fibrillation, unspecified: Secondary | ICD-10-CM

## 2017-03-07 DIAGNOSIS — Z5181 Encounter for therapeutic drug level monitoring: Secondary | ICD-10-CM

## 2017-03-07 NOTE — Patient Instructions (Signed)
Pre visit review using our clinic review tool, if applicable. No additional management support is needed unless otherwise documented below in the visit note. 

## 2017-03-08 ENCOUNTER — Ambulatory Visit (INDEPENDENT_AMBULATORY_CARE_PROVIDER_SITE_OTHER): Payer: MEDICARE

## 2017-03-08 ENCOUNTER — Other Ambulatory Visit: Payer: MEDICARE

## 2017-03-08 ENCOUNTER — Ambulatory Visit: Payer: MEDICARE

## 2017-03-08 ENCOUNTER — Telehealth: Payer: Self-pay

## 2017-03-08 DIAGNOSIS — I482 Chronic atrial fibrillation, unspecified: Secondary | ICD-10-CM

## 2017-03-08 DIAGNOSIS — Z5181 Encounter for therapeutic drug level monitoring: Secondary | ICD-10-CM

## 2017-03-08 LAB — POCT INR: INR: 3.1

## 2017-03-08 NOTE — Telephone Encounter (Signed)
Mitchell Farrell calls to cancel appointments today and report that his grandfather is having further general decline with difficulty getting him in and out of office. Functionally, he is needing more assistance with transfers and ambulation making care more challenging and there is a noted increase in bilateral leg edema. No reports of SOB.  We discussed in home support options and family is interested in having [name] and therapy come in and evaluate.  Goals for [facility] would include:  PT/OT eval and treat Medication training - teach grandson how to administer monthly B12 injections at home CHF monitoring, teaching, support PRN Aide services while H. H. in place to assist with personal care difficulties Social Worker involvement to assist family in making a long term support plan after H. H. pulls out.  [facility] will be sending paperwork over for Dr. Damita Dunnings to sign next week when he returns.  Patient and [name] will see Dr. Damita Dunnings on 03/15/17 at 3:15pm for a face to face visit to review changes in health and for home health authorization.  [name] is to call if any concerning changes with grandfather develop or if he begins to experience any SOB.

## 2017-03-08 NOTE — Telephone Encounter (Signed)
Noted. Thanks.  Will see pt at [place].

## 2017-03-08 NOTE — Patient Instructions (Signed)
Pre visit review using our clinic review tool, if applicable. No additional management support is needed unless otherwise documented below in the visit note. 

## 2017-03-13 ENCOUNTER — Other Ambulatory Visit: Payer: Self-pay | Admitting: Family Medicine

## 2017-03-15 ENCOUNTER — Ambulatory Visit (INDEPENDENT_AMBULATORY_CARE_PROVIDER_SITE_OTHER): Payer: MEDICARE | Admitting: Family Medicine

## 2017-03-15 ENCOUNTER — Encounter: Payer: Self-pay | Admitting: Family Medicine

## 2017-03-15 VITALS — BP 124/74 | HR 76 | Temp 98.4°F | Wt 194.5 lb

## 2017-03-15 DIAGNOSIS — R0602 Shortness of breath: Secondary | ICD-10-CM

## 2017-03-15 DIAGNOSIS — R609 Edema, unspecified: Secondary | ICD-10-CM | POA: Diagnosis not present

## 2017-03-15 DIAGNOSIS — E538 Deficiency of other specified B group vitamins: Secondary | ICD-10-CM

## 2017-03-15 DIAGNOSIS — I482 Chronic atrial fibrillation, unspecified: Secondary | ICD-10-CM

## 2017-03-15 DIAGNOSIS — R29898 Other symptoms and signs involving the musculoskeletal system: Secondary | ICD-10-CM | POA: Diagnosis not present

## 2017-03-15 DIAGNOSIS — R05 Cough: Secondary | ICD-10-CM

## 2017-03-15 DIAGNOSIS — Z7901 Long term (current) use of anticoagulants: Secondary | ICD-10-CM | POA: Diagnosis not present

## 2017-03-15 DIAGNOSIS — E039 Hypothyroidism, unspecified: Secondary | ICD-10-CM | POA: Diagnosis not present

## 2017-03-15 DIAGNOSIS — R059 Cough, unspecified: Secondary | ICD-10-CM

## 2017-03-15 LAB — POCT INR: INR: 3.6

## 2017-03-15 MED ORDER — LEVOTHYROXINE SODIUM 88 MCG PO TABS: 88.0000 ug | ORAL_TABLET | Freq: Every day | ORAL | 3 refills | Status: AC

## 2017-03-15 MED ORDER — LORATADINE 10 MG PO TABS: 10.0000 mg | ORAL_TABLET | Freq: Every day | ORAL | Status: AC | PRN

## 2017-03-15 MED ORDER — CARVEDILOL 3.125 MG PO TABS: 3.1250 mg | ORAL_TABLET | Freq: Two times a day (BID) | ORAL | 3 refills | Status: AC

## 2017-03-15 MED ORDER — GABAPENTIN 100 MG PO CAPS: 100.0000 mg | ORAL_CAPSULE | Freq: Every day | ORAL | 3 refills | Status: AC

## 2017-03-15 MED ORDER — BENZONATATE 200 MG PO CAPS: 200.0000 mg | ORAL_CAPSULE | Freq: Two times a day (BID) | ORAL | 5 refills | Status: AC | PRN

## 2017-03-15 NOTE — Patient Instructions (Addendum)
Go to the lab on the way out.  We'll contact you with your lab report. Home health will set up the B12 shots/teaching.  We'll be in touch.  Don't change your meds for now.  Okay to try claritin (not claritin D) for allergies.   Take care.  Glad to see you.

## 2017-03-16 LAB — COMPREHENSIVE METABOLIC PANEL
ALBUMIN: 3.1 g/dL — AB (ref 3.5–5.2)
ALK PHOS: 82 U/L (ref 39–117)
ALT: 9 U/L (ref 0–53)
AST: 15 U/L (ref 0–37)
BUN: 19 mg/dL (ref 6–23)
CALCIUM: 8.8 mg/dL (ref 8.4–10.5)
CHLORIDE: 108 meq/L (ref 96–112)
CO2: 26 mEq/L (ref 19–32)
Creatinine, Ser: 1.63 mg/dL — ABNORMAL HIGH (ref 0.40–1.50)
GFR: 50.74 mL/min — AB (ref >60.00)
Glucose, Bld: 103 mg/dL — ABNORMAL HIGH (ref 70–99)
POTASSIUM: 4.5 meq/L (ref 3.5–5.1)
SODIUM: 138 meq/L (ref 135–145)
Total Bilirubin: 0.4 mg/dL (ref 0.2–1.2)
Total Protein: 7.3 g/dL (ref 6.0–8.3)

## 2017-03-16 LAB — CBC WITH DIFFERENTIAL/PLATELET
BASOS PCT: 0.8 % (ref 0.0–3.0)
Basophils Absolute: 0.1 10*3/uL (ref 0.0–0.1)
EOS PCT: 2.3 % (ref 0.0–5.0)
Eosinophils Absolute: 0.1 10*3/uL (ref 0.0–0.7)
HEMATOCRIT: 37.7 % — AB (ref 39.0–52.0)
HEMOGLOBIN: 12 g/dL — AB (ref 13.0–17.0)
LYMPHS PCT: 21.1 % (ref 12.0–46.0)
Lymphs Abs: 1.4 10*3/uL (ref 0.7–4.0)
MCHC: 32 g/dL (ref 30.0–36.0)
MCV: 84.6 fl (ref 78.0–100.0)
MONOS PCT: 7.1 % (ref 3.0–12.0)
Monocytes Absolute: 0.5 10*3/uL (ref 0.1–1.0)
Neutro Abs: 4.4 10*3/uL (ref 1.4–7.7)
Neutrophils Relative %: 68.7 % (ref 43.0–77.0)
Platelets: 220 10*3/uL (ref 150.0–400.0)
RBC: 4.45 Mil/uL (ref 4.22–5.81)
RDW: 16.8 % — ABNORMAL HIGH (ref 11.5–15.5)
WBC: 6.5 10*3/uL (ref 4.0–10.5)

## 2017-03-16 LAB — PROTIME-INR
INR: 3.6 ratio — ABNORMAL HIGH (ref 0.8–1.0)
Prothrombin Time: 38.4 s — ABNORMAL HIGH (ref 9.6–13.1)

## 2017-03-16 LAB — TSH: TSH: 1.86 u[IU]/mL (ref 0.35–4.50)

## 2017-03-16 LAB — BRAIN NATRIURETIC PEPTIDE: PRO B NATRI PEPTIDE: 191 pg/mL — AB (ref 0.0–100.0)

## 2017-03-16 NOTE — Assessment & Plan Note (Signed)
Complicated by deconditioning. Refer for home health PT. >25 minutes spent in face to face time with patient, >50% spent in counselling or coordination of care.

## 2017-03-16 NOTE — Assessment & Plan Note (Signed)
See notes on labs. 

## 2017-03-16 NOTE — Progress Notes (Signed)
Follow-up for multiple issues. Left leg weakness. Using brace on left leg at baseline. Has gotten progressively deconditioned and now has significant weakness in both legs. In wheelchair today. Difficulty standing. Not able to reliably walk. has previously used home health PT. Needs this set up again. He did not have focal sudden onset of weakness, but has a gradual deconditioning.  History of B12 deficiency. It was difficult for patient to come in visits here B12 injection. He needs teaching set up. The done via home health. Discussed with patient and family today.  Still anticoagulated. No adverse effect on medication. Compliant. Due for INR today.  Runny nose and possible postnasal drip with frequent cough and some upper airway noise. Use Tessalon previously but ran out. No fevers.  Shortness of breath. Likely with deconditioning. Weight is up. Previously had history of edema that would respond to Lasix. He has been eating a lot more with his family cooking larger meals form. Per family "he's been eating good".  Taking Lasix and elevating his legs is not affected is lower extremity edema more recently. Was unclear how much of his weight gain was truly due to swelling versus just increased calorie intake.  He is not getting chest pain.  He still has some nightmares. He believes he sleeps through those and he believes that he is still sleeping well, however his family still notices these changes in his sleep at night.  History of hypothyroidism. Compliant. No dysphagia. TSH pending  PMH and SH reviewed  ROS: Per HPI unless specifically indicated in ROS section   Meds, vitals, and allergies reviewed.   GEN: nad, alert and oriented, in wheelchair, elderly, pleasant in conversation HEENT: mucous membranes moist NECK: supple w/o LA with upper airway noise noted.  CV: rrr with occ ectopy PULM: ctab, no inc wob ABD: soft, +bs EXT: 1+ edema in the BLE SKIN: no acute rash Bilateral lower  extremities week, especially for hip flexion and knee extension. Left leg in brace.

## 2017-03-16 NOTE — Assessment & Plan Note (Signed)
We will ask for home health teaching for B12 injection.

## 2017-03-16 NOTE — Assessment & Plan Note (Signed)
Complicated by weight gain with significant increasing calorie intake. No change in medications at this point. Discussed with patient about diet in moderation.

## 2017-03-16 NOTE — Assessment & Plan Note (Signed)
Likely at least partly due to postnasal drip. Okay to add on Claritin but avoid Claritin-D. Discussed with patient and family.

## 2017-03-16 NOTE — Assessment & Plan Note (Signed)
Continue anticoagulation. Unclear how much of her shortness of breath is related A. fib versus just weight gain. See notes on labs.

## 2017-03-18 DIAGNOSIS — I509 Heart failure, unspecified: Secondary | ICD-10-CM

## 2017-03-18 DIAGNOSIS — R531 Weakness: Secondary | ICD-10-CM | POA: Diagnosis not present

## 2017-03-18 DIAGNOSIS — I482 Chronic atrial fibrillation: Secondary | ICD-10-CM | POA: Diagnosis not present

## 2017-03-18 DIAGNOSIS — R600 Localized edema: Secondary | ICD-10-CM | POA: Diagnosis not present

## 2017-03-18 DIAGNOSIS — E538 Deficiency of other specified B group vitamins: Secondary | ICD-10-CM

## 2017-03-19 ENCOUNTER — Ambulatory Visit (INDEPENDENT_AMBULATORY_CARE_PROVIDER_SITE_OTHER): Payer: MEDICARE

## 2017-03-19 DIAGNOSIS — I482 Chronic atrial fibrillation, unspecified: Secondary | ICD-10-CM

## 2017-03-19 DIAGNOSIS — Z5181 Encounter for therapeutic drug level monitoring: Secondary | ICD-10-CM

## 2017-03-19 LAB — POCT INR: INR: 3.6

## 2017-03-19 NOTE — Patient Instructions (Signed)
Pre visit review using our clinic review tool, if applicable. No additional management support is needed unless otherwise documented below in the visit note. 

## 2017-03-20 ENCOUNTER — Telehealth: Payer: Self-pay | Admitting: *Deleted

## 2017-03-20 NOTE — Telephone Encounter (Signed)
Estill Bam[facility] at [facility] lm at triage requesting home health for pt 2x/wk for 3wks and once a week for 5 weeks for CHF management and to teach his son how to give b12 injections. pls advise

## 2017-03-21 NOTE — Telephone Encounter (Signed)
Verbal order given to [name] by telephone as instructed. 

## 2017-03-21 NOTE — Telephone Encounter (Signed)
Left message on voicemail for Mitchell Farrell to call back. 

## 2017-03-21 NOTE — Telephone Encounter (Signed)
Please give the order.  Thanks.   

## 2017-03-22 ENCOUNTER — Telehealth: Payer: Self-pay

## 2017-03-22 NOTE — Progress Notes (Signed)
Agree. Thanks

## 2017-03-22 NOTE — Telephone Encounter (Signed)
Erin PT with [facility] left v/m requesting verbal orders for [facility] PT 2 x a week for 7 weeks for strengthening,transfers,gait training and fall prevention.

## 2017-03-23 NOTE — Telephone Encounter (Signed)
Mitchell Farrell with [facility] advised.

## 2017-03-23 NOTE — Telephone Encounter (Signed)
Please give the order.  Thanks.   

## 2017-03-27 ENCOUNTER — Telehealth: Payer: Self-pay | Admitting: *Deleted

## 2017-03-27 ENCOUNTER — Ambulatory Visit (INDEPENDENT_AMBULATORY_CARE_PROVIDER_SITE_OTHER): Payer: MEDICARE

## 2017-03-27 DIAGNOSIS — I482 Chronic atrial fibrillation, unspecified: Secondary | ICD-10-CM

## 2017-03-27 DIAGNOSIS — Z5181 Encounter for therapeutic drug level monitoring: Secondary | ICD-10-CM

## 2017-03-27 LAB — POCT INR: INR: 1.8

## 2017-03-27 NOTE — Telephone Encounter (Signed)
Received message on Triage line from [place] with [facility] care asking if pt is to continue Vit B12?

## 2017-03-27 NOTE — Telephone Encounter (Signed)
Yes, IM q30days.  Thanks.

## 2017-03-27 NOTE — Patient Instructions (Signed)
Pre visit review using our clinic review tool, if applicable. No additional management support is needed unless otherwise documented below in the visit note. 

## 2017-03-27 NOTE — Telephone Encounter (Signed)
Left detailed message on voicemail of Tiffany at [facility].

## 2017-03-28 ENCOUNTER — Telehealth: Payer: Self-pay

## 2017-03-28 NOTE — Telephone Encounter (Signed)
Lm on Connies vm and provided verbal orders 

## 2017-03-28 NOTE — Telephone Encounter (Signed)
Mitchell Farrell with [facility] left v/m; pts family wants to know if going to start B12 back again and if so needs refills of Vit B 12 to [employer]. Tiffany left v/m to call grandson back but gave no contact #; can call Tiffany back.

## 2017-03-28 NOTE — Telephone Encounter (Signed)
Mitchell Farrell OT with [facility] request verbal orders for [facility] OT 2 x a week for 6 weeks.

## 2017-03-28 NOTE — Addendum Note (Signed)
Addended by: Helene Shoe on: 03/28/2017 03:16 PM   Modules accepted: Orders

## 2017-03-28 NOTE — Telephone Encounter (Signed)
Please give the order.  

## 2017-03-29 MED ORDER — CYANOCOBALAMIN 1000 MCG/ML IJ SOLN: 1000.0000 ug | INTRAMUSCULAR | 3 refills | Status: AC

## 2017-03-29 NOTE — Telephone Encounter (Signed)
Grandson advised.  When I dial the call back number listed for Grandville Silos with [facility], I get the VM of "Penni Bombard".  I left the message assuming that perhaps they were covering for each other but apparently Tiffany never received the message.  Mike Craze, CMA

## 2017-03-29 NOTE — Addendum Note (Signed)
Addended by: Tonia Ghent on: 03/29/2017 06:54 AM   Modules accepted: Orders

## 2017-03-29 NOTE — Telephone Encounter (Signed)
Sent. Thanks.   

## 2017-04-01 NOTE — Progress Notes (Signed)
agree.  thanks.  

## 2017-04-03 ENCOUNTER — Ambulatory Visit (INDEPENDENT_AMBULATORY_CARE_PROVIDER_SITE_OTHER): Payer: MEDICARE

## 2017-04-03 DIAGNOSIS — Z5181 Encounter for therapeutic drug level monitoring: Secondary | ICD-10-CM

## 2017-04-03 DIAGNOSIS — I482 Chronic atrial fibrillation, unspecified: Secondary | ICD-10-CM

## 2017-04-03 LAB — POCT INR: INR: 2.5

## 2017-04-06 LAB — POCT INR: INR: 2.5

## 2017-04-06 NOTE — Patient Instructions (Signed)
Pre visit review using our clinic review tool, if applicable. No additional management support is needed unless otherwise documented below in the visit note. 

## 2017-04-08 NOTE — Progress Notes (Signed)
Agree. Thanks

## 2017-04-16 ENCOUNTER — Telehealth: Payer: Self-pay

## 2017-04-16 NOTE — Telephone Encounter (Addendum)
[name] (DPR signed) said that pts daughter and [name] would like to sit down for 15 mins and talk with Dr Damita Dunnings. [name] said nurse from [place] at [facility] is confusing them about care of pt and what is expected of family to do on daily basis; taking BP and meds etc. [name] request specific instructions of what the family is to do for pt on daily basis including taking of meds. Please advise.

## 2017-04-17 ENCOUNTER — Ambulatory Visit (INDEPENDENT_AMBULATORY_CARE_PROVIDER_SITE_OTHER): Payer: MEDICARE

## 2017-04-17 DIAGNOSIS — Z5181 Encounter for therapeutic drug level monitoring: Secondary | ICD-10-CM

## 2017-04-17 DIAGNOSIS — I482 Chronic atrial fibrillation, unspecified: Secondary | ICD-10-CM

## 2017-04-17 LAB — POCT INR: INR: 1.8

## 2017-04-17 NOTE — Progress Notes (Signed)
agree.  thanks.  

## 2017-04-17 NOTE — Telephone Encounter (Signed)
Erroneous encounter

## 2017-04-17 NOTE — Telephone Encounter (Signed)
There are concerns about the nurses coming and giving advice that hasn't been discussed with you and it makes for a confusing situation.  Nurses are recommending a weight and BP every day.  Patient is not able to stand long enough to weigh on the scales that they have available to them.    Nurses are also recommending the fluid pill every day and family says if that is done, he will be urinating on himself all day.    Mitchell Farrell says the patient is telling the nurses that he coughs all the time so they say that he needs cough medicine.  Mitchell Farrell says he does not cough all the time and that you and the family have talked about not putting him on a lot of medication.  Please advise about setting up the appointment.

## 2017-04-17 NOTE — Patient Instructions (Signed)
Pre visit review using our clinic review tool, if applicable. No additional management support is needed unless otherwise documented below in the visit note. 

## 2017-04-17 NOTE — Telephone Encounter (Signed)
I've been thinking about this- can I get a list of specific concerns or questions to address?  That would help me help them. Thanks .

## 2017-04-18 NOTE — Telephone Encounter (Signed)
Patient's grandson Mitchell Farrell on [place] notified as instructed by telephone and verbalized understanding. Mitchell Farrell stated that patient does not have a persistent cough. Mitchell Farrell stated that his granddad just likes to take medication. Mitchell Farrell stated that the nurses that are coming out from [facility] are the ones that are causing all of the confusion. Mitchell Farrell stated that if he sees that patient needs to be re-evaluated he will call for an appointment.

## 2017-04-18 NOTE — Telephone Encounter (Signed)
I think this can get handled over the phone.  I would not try to weight the patient at home, especially if he is having difficulty with weightbearing as this could be hazardous. I would try to judge his weight based on his body habitus, leg swelling, the fit of his clothes (expecially socks and waist line), and his breathing.  I would use Lasix only as needed. If they truly hear a persistent cough and we can reevaluate him. Let me know what they think about all this and we'll go from there. Thanks. Please pass along to the patient and his family that I greatly appreciate the help of all involved, especially his family in caring for him.

## 2017-04-19 NOTE — Telephone Encounter (Signed)
Noted. Thanks.

## 2017-04-30 ENCOUNTER — Telehealth: Payer: Self-pay

## 2017-04-30 NOTE — Telephone Encounter (Signed)
Mitchell Farrell needs verbal orders to extend his OT 2x a week for 2 more weeks.

## 2017-04-30 NOTE — Telephone Encounter (Signed)
Please give the order.  Thanks.   

## 2017-05-01 NOTE — Telephone Encounter (Signed)
Left detailed message on voicemail.  

## 2017-05-07 ENCOUNTER — Ambulatory Visit: Payer: Self-pay

## 2017-05-07 DIAGNOSIS — I482 Chronic atrial fibrillation, unspecified: Secondary | ICD-10-CM

## 2017-05-07 DIAGNOSIS — Z5181 Encounter for therapeutic drug level monitoring: Secondary | ICD-10-CM

## 2017-05-07 LAB — POCT INR: INR: 2.3

## 2017-05-08 ENCOUNTER — Telehealth: Payer: Self-pay

## 2017-05-08 NOTE — Telephone Encounter (Signed)
Left detailed message on voicemail of [place] PT with [facility].

## 2017-05-08 NOTE — Telephone Encounter (Signed)
Please give the order.  Thanks.   

## 2017-05-08 NOTE — Telephone Encounter (Signed)
Lattie Haw RN with [facility] left v/m requesting cb with verbal orders for recertification of [facility] skilled nursing.

## 2017-05-08 NOTE — Telephone Encounter (Signed)
Lattie Haw RN with [facility] advised.

## 2017-05-08 NOTE — Telephone Encounter (Signed)
Erin PT with [facility] left v/m requesting verbal orders for [facility] PT 2 x a week for 4 weeks.

## 2017-05-16 ENCOUNTER — Telehealth: Payer: Self-pay | Admitting: *Deleted

## 2017-05-16 ENCOUNTER — Other Ambulatory Visit: Payer: Self-pay | Admitting: Family Medicine

## 2017-05-16 NOTE — Telephone Encounter (Signed)
Lattie Haw with [facility] is requesting an order to continue care 1/wk for 8 wks with 2 prn visits. Pls advise

## 2017-05-16 NOTE — Telephone Encounter (Signed)
Left message on voice mail  to call back

## 2017-05-16 NOTE — Telephone Encounter (Signed)
Please give the order.  

## 2017-05-17 MED ORDER — FUROSEMIDE 20 MG PO TABS: 20.0000 mg | ORAL_TABLET | Freq: Every day | ORAL | 0 refills | Status: AC | PRN

## 2017-05-17 NOTE — Telephone Encounter (Signed)
Left detailed message on voicemail.  

## 2017-05-17 NOTE — Telephone Encounter (Signed)
Please give the order.  

## 2017-05-17 NOTE — Telephone Encounter (Signed)
[name],[facility], called and wanted to add a Home health aid 2 x a week for the certification period and they can cut it short, if patient doesn't need it for that long. Lattie Haw can be reached at [phone].

## 2017-05-17 NOTE — Telephone Encounter (Signed)
Patient is compliant with coumadin management.  Will refill and also refill his furosemide X 1 as he is to continue with current therapy.  Thanks.

## 2017-05-18 NOTE — Telephone Encounter (Signed)
[name] with [facility] advised.

## 2017-05-23 ENCOUNTER — Other Ambulatory Visit: Payer: Self-pay | Admitting: Family Medicine

## 2017-05-25 NOTE — Patient Instructions (Signed)
Pre visit review using our clinic review tool, if applicable. No additional management support is needed unless otherwise documented below in the visit note. 

## 2017-05-26 NOTE — Progress Notes (Signed)
Agree. Thanks

## 2017-05-28 ENCOUNTER — Ambulatory Visit: Payer: Self-pay

## 2017-05-28 DIAGNOSIS — I482 Chronic atrial fibrillation, unspecified: Secondary | ICD-10-CM

## 2017-05-28 DIAGNOSIS — Z5181 Encounter for therapeutic drug level monitoring: Secondary | ICD-10-CM

## 2017-05-28 LAB — POCT INR: INR: 1.5

## 2017-05-28 NOTE — Patient Instructions (Signed)
Pre visit review using our clinic review tool, if applicable. No additional management support is needed unless otherwise documented below in the visit note. 

## 2017-05-29 NOTE — Progress Notes (Signed)
Agree, thanks

## 2017-06-05 LAB — POCT INR: INR: 1.7

## 2017-06-07 ENCOUNTER — Ambulatory Visit: Payer: Self-pay

## 2017-06-07 ENCOUNTER — Telehealth: Payer: Self-pay

## 2017-06-07 ENCOUNTER — Encounter: Payer: Self-pay | Admitting: *Deleted

## 2017-06-07 DIAGNOSIS — Z5181 Encounter for therapeutic drug level monitoring: Secondary | ICD-10-CM

## 2017-06-07 DIAGNOSIS — I482 Chronic atrial fibrillation, unspecified: Secondary | ICD-10-CM

## 2017-06-07 NOTE — Telephone Encounter (Signed)
Mitchell Farrell (grandson) says he just gave the patient his B12 injection yesterday.  HH may run out on September 15th unless his benefits are extended.  Mitchell Farrell says he will bring him in to the office if necessary.  Please advise.

## 2017-06-07 NOTE — Patient Instructions (Signed)
Pre visit review using our clinic review tool, if applicable. No additional management support is needed unless otherwise documented below in the visit note. 

## 2017-06-07 NOTE — Telephone Encounter (Signed)
While discussing patient's in home INR report results, grandson [name] requests refill on patient's injectable B12.  [name] is administering at home after receiving guidance and instruction from home health nurse.  Last B12 level: 09/2016  (patient should have had recheck in May but couldn't make it to appointment).    Currently, patient does not need refills on his B12 as 1 year R/X was given in June.    Dr. Damita Dunnings, please advise when a recheck on patient's B12 would be appropriate? He still has home health in place and they could draw one at home if necessary.  Thanks.

## 2017-06-07 NOTE — Telephone Encounter (Signed)
Please have them draw a B12 level prior to next B12 shot.  Let me know if an order needs to be placed in Epic.  Thanks.

## 2017-06-07 NOTE — Telephone Encounter (Signed)
Order sent to HH

## 2017-06-07 NOTE — Telephone Encounter (Signed)
Please have them draw it in about 10 days.  Thanks.

## 2017-06-11 NOTE — Progress Notes (Signed)
Agree. Thanks

## 2017-06-18 LAB — POCT INR: INR: 2.1

## 2017-06-21 ENCOUNTER — Ambulatory Visit: Payer: Self-pay

## 2017-06-21 DIAGNOSIS — I482 Chronic atrial fibrillation, unspecified: Secondary | ICD-10-CM

## 2017-06-21 DIAGNOSIS — Z5181 Encounter for therapeutic drug level monitoring: Secondary | ICD-10-CM

## 2017-06-21 LAB — POCT INR: INR: 2.1

## 2017-06-21 NOTE — Patient Instructions (Signed)
Pre visit review using our clinic review tool, if applicable. No additional management support is needed unless otherwise documented below in the visit note. 

## 2017-06-21 NOTE — Progress Notes (Signed)
Agree, thanks

## 2017-06-22 ENCOUNTER — Encounter: Payer: Self-pay | Admitting: Family Medicine

## 2017-06-22 LAB — LAB REPORT - SCANNED: VITAMIN B12: 752

## 2017-06-28 ENCOUNTER — Telehealth: Payer: Self-pay

## 2017-06-28 NOTE — Telephone Encounter (Signed)
Please give the order.  Thanks.   

## 2017-06-28 NOTE — Telephone Encounter (Signed)
Marlowe Kays OT with [facility] left v/m requesting verbal orders to extend [facility] OT 2 x a week for 4 weeks.pt is making progress when standing.

## 2017-06-28 NOTE — Telephone Encounter (Signed)
Marlowe Kays OT with [facility] advised.

## 2017-07-11 ENCOUNTER — Ambulatory Visit: Payer: Self-pay

## 2017-07-11 DIAGNOSIS — Z5181 Encounter for therapeutic drug level monitoring: Secondary | ICD-10-CM

## 2017-07-11 DIAGNOSIS — I482 Chronic atrial fibrillation, unspecified: Secondary | ICD-10-CM

## 2017-07-11 LAB — POCT INR: INR: 1.9

## 2017-07-11 NOTE — Patient Instructions (Signed)
Pre visit review using our clinic review tool, if applicable. No additional management support is needed unless otherwise documented below in the visit note. 

## 2017-07-15 NOTE — Progress Notes (Signed)
Agree. Thanks

## 2017-08-15 ENCOUNTER — Telehealth: Payer: Self-pay

## 2017-08-15 NOTE — Telephone Encounter (Signed)
Spoke with patient's grandson, [name], as patient is past due on his home INR report.   Grandson reports that patient now is receiving ALL care including physician, coumadin and medication management through the [place] now and will no longer be followed here at [facility].  He and his mom want to thank Dr. Damita Dunnings for the exceptional care that their father/grandfather received and they will miss [place] all.

## 2017-08-16 NOTE — Telephone Encounter (Signed)
1.  Please remove me as the PCP 2.  Please let the patient and family know that I was always glad to see them and I wish them only the best.  It was a pleasure and privilege to get to see this gentleman in the clinic.  Thanks.

## 2017-08-16 NOTE — Telephone Encounter (Signed)
Dr. Damita Dunnings removed as PCP as requested.

## 2017-10-18 ENCOUNTER — Ambulatory Visit: Payer: Self-pay | Admitting: General Practice

## 2019-11-17 ENCOUNTER — Other Ambulatory Visit: Payer: Self-pay

## 2019-11-17 ENCOUNTER — Encounter: Payer: Self-pay | Admitting: Podiatry

## 2019-11-17 ENCOUNTER — Ambulatory Visit (INDEPENDENT_AMBULATORY_CARE_PROVIDER_SITE_OTHER): Payer: MEDICARE | Admitting: Podiatry

## 2019-11-17 DIAGNOSIS — I739 Peripheral vascular disease, unspecified: Secondary | ICD-10-CM | POA: Diagnosis not present

## 2019-11-17 DIAGNOSIS — M79675 Pain in left toe(s): Secondary | ICD-10-CM | POA: Diagnosis not present

## 2019-11-17 DIAGNOSIS — M79674 Pain in right toe(s): Secondary | ICD-10-CM

## 2019-11-17 DIAGNOSIS — D689 Coagulation defect, unspecified: Secondary | ICD-10-CM

## 2019-11-17 DIAGNOSIS — B351 Tinea unguium: Secondary | ICD-10-CM

## 2019-11-17 DIAGNOSIS — G629 Polyneuropathy, unspecified: Secondary | ICD-10-CM | POA: Diagnosis not present

## 2019-11-17 NOTE — Progress Notes (Signed)
Complaint:  Visit Type: Patient returns to my office for continued preventative foot care services.  Patient states" my nails have grown long and thick and become painful to walk and wear shoes"  He presents to the office in a wheelchair and is accompanied by his grandson.  Patient was last seen in this office by a doctor in 2017.   Patient has history of going to the [place] and his grandson says he was given a night brace to use to sleep.   Patient has been diagnosed with venous stasis and is taking gabapentin  and coumadin. The patient presents for preventative foot care services.  Podiatric Exam: Vascular: dorsalis pedis and posterior tibial pulses are not  palpable bilateral due to swelling.. Capillary return is immediate. Temperature gradient is WNL. Skin turgor WNL Venous stasis  B/L Sensorium: Diminished  Semmes Weinstein monofilament test. Diminished  tactile sensation bilaterally. Nail Exam: Pt has thick disfigured discolored nails with subungual debris noted bilateral entire nail hallux through fifth toenails Ulcer Exam: There is no evidence of ulcer or pre-ulcerative changes or infection. Orthopedic Exam: Muscle tone and strength are WNL. No limitations in general ROM. No crepitus or effusions noted. Foot type and digits show no abnormalities. Bony prominences are unremarkable. Skin: No Porokeratosis. No infection or ulcers  Diagnosis:  Onychomycosis, , Pain in right toe, pain in left toes  Treatment & Plan Procedures and Treatment: Consent by patient was obtained for treatment procedures.   Debridement of mycotic and hypertrophic toenails, 1 through 5 bilateral and clearing of subungual debris. No ulceration, no infection noted.  Return Visit-Office Procedure: Patient instructed to return to the office for a follow up visit 4 months for continued evaluation and treatment.    Gardiner Barefoot DPM

## 2020-01-14 ENCOUNTER — Inpatient Hospital Stay (HOSPITAL_COMMUNITY)
Admission: EM | Admit: 2020-01-14 | Discharge: 2020-02-07 | DRG: 871 | Disposition: E | Payer: MEDICARE | Attending: Internal Medicine | Admitting: Internal Medicine

## 2020-01-14 ENCOUNTER — Other Ambulatory Visit: Payer: Self-pay

## 2020-01-14 ENCOUNTER — Inpatient Hospital Stay (HOSPITAL_COMMUNITY): Payer: MEDICARE

## 2020-01-14 ENCOUNTER — Emergency Department (HOSPITAL_COMMUNITY): Payer: MEDICARE

## 2020-01-14 ENCOUNTER — Encounter (HOSPITAL_COMMUNITY): Payer: Self-pay | Admitting: Emergency Medicine

## 2020-01-14 DIAGNOSIS — N179 Acute kidney failure, unspecified: Secondary | ICD-10-CM | POA: Diagnosis present

## 2020-01-14 DIAGNOSIS — R531 Weakness: Secondary | ICD-10-CM

## 2020-01-14 DIAGNOSIS — Z808 Family history of malignant neoplasm of other organs or systems: Secondary | ICD-10-CM

## 2020-01-14 DIAGNOSIS — E8809 Other disorders of plasma-protein metabolism, not elsewhere classified: Secondary | ICD-10-CM | POA: Diagnosis present

## 2020-01-14 DIAGNOSIS — G9340 Encephalopathy, unspecified: Secondary | ICD-10-CM | POA: Diagnosis present

## 2020-01-14 DIAGNOSIS — A419 Sepsis, unspecified organism: Secondary | ICD-10-CM | POA: Diagnosis present

## 2020-01-14 DIAGNOSIS — I130 Hypertensive heart and chronic kidney disease with heart failure and stage 1 through stage 4 chronic kidney disease, or unspecified chronic kidney disease: Secondary | ICD-10-CM | POA: Diagnosis present

## 2020-01-14 DIAGNOSIS — N1831 Chronic kidney disease, stage 3a: Secondary | ICD-10-CM | POA: Diagnosis present

## 2020-01-14 DIAGNOSIS — K219 Gastro-esophageal reflux disease without esophagitis: Secondary | ICD-10-CM | POA: Diagnosis present

## 2020-01-14 DIAGNOSIS — R600 Localized edema: Secondary | ICD-10-CM

## 2020-01-14 DIAGNOSIS — I5020 Unspecified systolic (congestive) heart failure: Secondary | ICD-10-CM | POA: Diagnosis present

## 2020-01-14 DIAGNOSIS — I5043 Acute on chronic combined systolic (congestive) and diastolic (congestive) heart failure: Secondary | ICD-10-CM | POA: Diagnosis present

## 2020-01-14 DIAGNOSIS — Z79899 Other long term (current) drug therapy: Secondary | ICD-10-CM

## 2020-01-14 DIAGNOSIS — L89150 Pressure ulcer of sacral region, unstageable: Secondary | ICD-10-CM | POA: Diagnosis present

## 2020-01-14 DIAGNOSIS — M4628 Osteomyelitis of vertebra, sacral and sacrococcygeal region: Secondary | ICD-10-CM | POA: Diagnosis present

## 2020-01-14 DIAGNOSIS — B9620 Unspecified Escherichia coli [E. coli] as the cause of diseases classified elsewhere: Secondary | ICD-10-CM | POA: Diagnosis present

## 2020-01-14 DIAGNOSIS — Z7901 Long term (current) use of anticoagulants: Secondary | ICD-10-CM | POA: Diagnosis not present

## 2020-01-14 DIAGNOSIS — Z7982 Long term (current) use of aspirin: Secondary | ICD-10-CM | POA: Diagnosis not present

## 2020-01-14 DIAGNOSIS — D649 Anemia, unspecified: Secondary | ICD-10-CM | POA: Diagnosis present

## 2020-01-14 DIAGNOSIS — Z8249 Family history of ischemic heart disease and other diseases of the circulatory system: Secondary | ICD-10-CM

## 2020-01-14 DIAGNOSIS — Z79890 Hormone replacement therapy (postmenopausal): Secondary | ICD-10-CM | POA: Diagnosis not present

## 2020-01-14 DIAGNOSIS — Z6828 Body mass index (BMI) 28.0-28.9, adult: Secondary | ICD-10-CM

## 2020-01-14 DIAGNOSIS — M869 Osteomyelitis, unspecified: Secondary | ICD-10-CM | POA: Diagnosis present

## 2020-01-14 DIAGNOSIS — I480 Paroxysmal atrial fibrillation: Secondary | ICD-10-CM | POA: Diagnosis present

## 2020-01-14 DIAGNOSIS — Z66 Do not resuscitate: Secondary | ICD-10-CM | POA: Diagnosis present

## 2020-01-14 DIAGNOSIS — I739 Peripheral vascular disease, unspecified: Secondary | ICD-10-CM | POA: Diagnosis present

## 2020-01-14 DIAGNOSIS — R4182 Altered mental status, unspecified: Secondary | ICD-10-CM

## 2020-01-14 DIAGNOSIS — Z7189 Other specified counseling: Secondary | ICD-10-CM | POA: Diagnosis not present

## 2020-01-14 DIAGNOSIS — M1990 Unspecified osteoarthritis, unspecified site: Secondary | ICD-10-CM | POA: Diagnosis present

## 2020-01-14 DIAGNOSIS — L97529 Non-pressure chronic ulcer of other part of left foot with unspecified severity: Secondary | ICD-10-CM | POA: Diagnosis present

## 2020-01-14 DIAGNOSIS — E039 Hypothyroidism, unspecified: Secondary | ICD-10-CM | POA: Diagnosis present

## 2020-01-14 DIAGNOSIS — Z20822 Contact with and (suspected) exposure to covid-19: Secondary | ICD-10-CM | POA: Diagnosis present

## 2020-01-14 DIAGNOSIS — M609 Myositis, unspecified: Secondary | ICD-10-CM | POA: Diagnosis present

## 2020-01-14 DIAGNOSIS — L89159 Pressure ulcer of sacral region, unspecified stage: Secondary | ICD-10-CM

## 2020-01-14 DIAGNOSIS — E43 Unspecified severe protein-calorie malnutrition: Secondary | ICD-10-CM | POA: Diagnosis present

## 2020-01-14 DIAGNOSIS — N390 Urinary tract infection, site not specified: Secondary | ICD-10-CM | POA: Diagnosis present

## 2020-01-14 DIAGNOSIS — Z515 Encounter for palliative care: Secondary | ICD-10-CM | POA: Diagnosis not present

## 2020-01-14 DIAGNOSIS — A4151 Sepsis due to Escherichia coli [E. coli]: Principal | ICD-10-CM | POA: Diagnosis present

## 2020-01-14 DIAGNOSIS — I5023 Acute on chronic systolic (congestive) heart failure: Secondary | ICD-10-CM | POA: Diagnosis not present

## 2020-01-14 DIAGNOSIS — L89899 Pressure ulcer of other site, unspecified stage: Secondary | ICD-10-CM

## 2020-01-14 DIAGNOSIS — R7982 Elevated C-reactive protein (CRP): Secondary | ICD-10-CM | POA: Diagnosis not present

## 2020-01-14 DIAGNOSIS — G92 Toxic encephalopathy: Secondary | ICD-10-CM | POA: Diagnosis present

## 2020-01-14 DIAGNOSIS — N189 Chronic kidney disease, unspecified: Secondary | ICD-10-CM | POA: Diagnosis not present

## 2020-01-14 LAB — URINALYSIS, ROUTINE W REFLEX MICROSCOPIC
Bilirubin Urine: NEGATIVE
Glucose, UA: NEGATIVE mg/dL
Ketones, ur: 5 mg/dL — AB
Nitrite: NEGATIVE
Protein, ur: 30 mg/dL — AB
Specific Gravity, Urine: 1.02 (ref 1.005–1.030)
WBC, UA: >50 WBC/hpf — ABNORMAL HIGH (ref 0–5)
pH: 5 (ref 5.0–8.0)

## 2020-01-14 LAB — CBC
HCT: 33.9 % — ABNORMAL LOW (ref 39.0–52.0)
Hemoglobin: 10.4 g/dL — ABNORMAL LOW (ref 13.0–17.0)
MCH: 26.9 pg (ref 26.0–34.0)
MCHC: 30.7 g/dL (ref 30.0–36.0)
MCV: 87.6 fL (ref 80.0–100.0)
Platelets: 296 10*3/uL (ref 150–400)
RBC: 3.87 MIL/uL — ABNORMAL LOW (ref 4.22–5.81)
RDW: 15.9 % — ABNORMAL HIGH (ref 11.5–15.5)
WBC: 15.5 10*3/uL — ABNORMAL HIGH (ref 4.0–10.5)
nRBC: 0 % (ref 0.0–0.2)

## 2020-01-14 LAB — PROTIME-INR
INR: 1.3 — ABNORMAL HIGH (ref 0.8–1.2)
Prothrombin Time: 15.9 seconds — ABNORMAL HIGH (ref 11.4–15.2)

## 2020-01-14 LAB — LACTIC ACID, PLASMA
Lactic Acid, Venous: 2.1 mmol/L (ref 0.5–1.9)
Lactic Acid, Venous: 2.4 mmol/L (ref 0.5–1.9)

## 2020-01-14 LAB — COMPREHENSIVE METABOLIC PANEL
ALT: 20 U/L (ref 0–44)
AST: 45 U/L — ABNORMAL HIGH (ref 15–41)
Albumin: 1.8 g/dL — ABNORMAL LOW (ref 3.5–5.0)
Alkaline Phosphatase: 124 U/L (ref 38–126)
Anion gap: 13 (ref 5–15)
BUN: 32 mg/dL — ABNORMAL HIGH (ref 8–23)
CO2: 17 mmol/L — ABNORMAL LOW (ref 22–32)
Calcium: 7.9 mg/dL — ABNORMAL LOW (ref 8.9–10.3)
Chloride: 108 mmol/L (ref 98–111)
Creatinine, Ser: 2.13 mg/dL — ABNORMAL HIGH (ref 0.61–1.24)
GFR calc Af Amer: 29 mL/min — ABNORMAL LOW (ref >60)
GFR calc non Af Amer: 25 mL/min — ABNORMAL LOW (ref >60)
Glucose, Bld: 108 mg/dL — ABNORMAL HIGH (ref 70–99)
Potassium: 5 mmol/L (ref 3.5–5.1)
Sodium: 138 mmol/L (ref 135–145)
Total Bilirubin: 1.3 mg/dL — ABNORMAL HIGH (ref 0.3–1.2)
Total Protein: 6.5 g/dL (ref 6.5–8.1)

## 2020-01-14 LAB — CBG MONITORING, ED: Glucose-Capillary: 95 mg/dL (ref 70–99)

## 2020-01-14 LAB — SARS CORONAVIRUS 2 (TAT 6-24 HRS): SARS Coronavirus 2: NEGATIVE

## 2020-01-14 LAB — APTT: aPTT: 37 seconds — ABNORMAL HIGH (ref 24–36)

## 2020-01-14 MED ORDER — ENSURE ENLIVE PO LIQD
237.0000 mL | Freq: Two times a day (BID) | ORAL | Status: DC
  Administered 2020-01-15 – 2020-01-17 (×6): 237 mL via ORAL
  Filled 2020-01-14: qty 237

## 2020-01-14 MED ORDER — POLYVINYL ALCOHOL 1.4 % OP SOLN
OPHTHALMIC | Status: DC | PRN
  Filled 2020-01-14: qty 15

## 2020-01-14 MED ORDER — CARVEDILOL 3.125 MG PO TABS: 3.1250 mg | ORAL_TABLET | Freq: Two times a day (BID) | ORAL | Status: DC

## 2020-01-14 MED ORDER — HEPARIN SODIUM (PORCINE) 5000 UNIT/ML IJ SOLN
5000.0000 [IU] | Freq: Three times a day (TID) | INTRAMUSCULAR | Status: DC
  Administered 2020-01-14 – 2020-01-17 (×9): 5000 [IU] via SUBCUTANEOUS
  Filled 2020-01-14 (×8): qty 1

## 2020-01-14 MED ORDER — LACTATED RINGERS IV BOLUS (SEPSIS)
1000.0000 mL | Freq: Once | INTRAVENOUS | Status: AC
  Administered 2020-01-14: 12:00:00 1000 mL via INTRAVENOUS

## 2020-01-14 MED ORDER — ALBUTEROL SULFATE (2.5 MG/3ML) 0.083% IN NEBU: 2.5000 mg | INHALATION_SOLUTION | Freq: Four times a day (QID) | RESPIRATORY_TRACT | Status: DC | PRN

## 2020-01-14 MED ORDER — SODIUM CHLORIDE 0.9% FLUSH: 3.0000 mL | Freq: Once | INTRAVENOUS | Status: DC

## 2020-01-14 MED ORDER — ASPIRIN 325 MG PO TABS
325.0000 mg | ORAL_TABLET | Freq: Every day | ORAL | Status: DC
  Administered 2020-01-15 – 2020-01-16 (×2): 325 mg via ORAL
  Filled 2020-01-14 (×3): qty 1

## 2020-01-14 MED ORDER — ACETAMINOPHEN 325 MG PO TABS: 650.0000 mg | ORAL_TABLET | Freq: Four times a day (QID) | ORAL | Status: DC | PRN

## 2020-01-14 MED ORDER — VANCOMYCIN HCL 1500 MG/300ML IV SOLN
1500.0000 mg | Freq: Once | INTRAVENOUS | Status: AC
  Administered 2020-01-14: 1500 mg via INTRAVENOUS
  Filled 2020-01-14 (×2): qty 300

## 2020-01-14 MED ORDER — LATANOPROST 0.005 % OP SOLN
1.0000 [drp] | Freq: Every day | OPHTHALMIC | Status: DC
  Administered 2020-01-15 – 2020-01-19 (×5): 1 [drp] via OPHTHALMIC
  Filled 2020-01-14: qty 2.5

## 2020-01-14 MED ORDER — DORZOLAMIDE HCL 2 % OP SOLN
1.0000 [drp] | Freq: Two times a day (BID) | OPHTHALMIC | Status: DC
  Administered 2020-01-15 – 2020-01-20 (×11): 1 [drp] via OPHTHALMIC
  Filled 2020-01-14: qty 10

## 2020-01-14 MED ORDER — LEVOTHYROXINE SODIUM 88 MCG PO TABS
88.0000 ug | ORAL_TABLET | Freq: Every day | ORAL | Status: DC
  Administered 2020-01-15 – 2020-01-17 (×3): 88 ug via ORAL
  Filled 2020-01-14 (×3): qty 1

## 2020-01-14 MED ORDER — LACTATED RINGERS IV BOLUS (SEPSIS)
1000.0000 mL | Freq: Once | INTRAVENOUS | Status: AC
  Administered 2020-01-14: 1000 mL via INTRAVENOUS

## 2020-01-14 MED ORDER — SODIUM CHLORIDE 0.9% FLUSH
3.0000 mL | Freq: Two times a day (BID) | INTRAVENOUS | Status: DC
  Administered 2020-01-14 – 2020-01-17 (×6): 3 mL via INTRAVENOUS

## 2020-01-14 MED ORDER — VANCOMYCIN HCL 1250 MG/250ML IV SOLN
1250.0000 mg | INTRAVENOUS | Status: DC
  Administered 2020-01-16: 13:00:00 1250 mg via INTRAVENOUS
  Filled 2020-01-14: qty 250

## 2020-01-14 MED ORDER — LACTATED RINGERS IV BOLUS (SEPSIS)
1000.0000 mL | Freq: Once | INTRAVENOUS | Status: AC
  Administered 2020-01-14: 14:00:00 1000 mL via INTRAVENOUS

## 2020-01-14 MED ORDER — SODIUM CHLORIDE 0.9 % IV SOLN
2.0000 g | Freq: Once | INTRAVENOUS | Status: AC
  Administered 2020-01-14: 11:00:00 2 g via INTRAVENOUS
  Filled 2020-01-14: qty 20

## 2020-01-14 MED ORDER — GABAPENTIN 100 MG PO CAPS
200.0000 mg | ORAL_CAPSULE | Freq: Every day | ORAL | Status: DC
  Administered 2020-01-15 – 2020-01-16 (×2): 200 mg via ORAL
  Filled 2020-01-14 (×2): qty 2

## 2020-01-14 MED ORDER — ALBUMIN HUMAN 25 % IV SOLN
12.5000 g | Freq: Once | INTRAVENOUS | Status: DC
  Filled 2020-01-14 (×2): qty 50

## 2020-01-14 MED ORDER — ACETAMINOPHEN 650 MG RE SUPP: 650.0000 mg | Freq: Four times a day (QID) | RECTAL | Status: DC | PRN

## 2020-01-14 MED ORDER — FUROSEMIDE 10 MG/ML IJ SOLN
20.0000 mg | Freq: Once | INTRAMUSCULAR | Status: AC
  Administered 2020-01-14: 18:00:00 20 mg via INTRAVENOUS
  Filled 2020-01-14: qty 2

## 2020-01-14 MED ORDER — SODIUM CHLORIDE 0.9 % IV SOLN
2.0000 g | INTRAVENOUS | Status: DC
  Administered 2020-01-15 – 2020-01-17 (×3): 2 g via INTRAVENOUS
  Filled 2020-01-14 (×3): qty 20

## 2020-01-14 NOTE — Sepsis Progress Note (Signed)
Notified bedside nurse of need to draw repeat lactic acid after completion of fluid resusitation. 

## 2020-01-14 NOTE — ED Triage Notes (Signed)
Arrived via EMS from home who lives with son. Son called EMS for altered mental status for 2 days.

## 2020-01-14 NOTE — Consult Note (Signed)
WOC Nurse Consult Note: Patient receiving care in [facility] ED40.  Consult completed remotely after review of record and images of wounds. Reason for Consult: "sacral pressure sore care" Wound type: Unstageable PI to sacrum and left great toe, medial aspect. Pressure Injury POA: Yes Measurement: To be provided by the bedside RN in the flowsheet section Wound bed: See photos, black eschar present to both wounds Drainage (amount, consistency, odor) none observed in photos Periwound: intact Dressing procedure/placement/frequency: Saline moistened gauze with ABD pads to sacral wound, change twice daily.  This should help soften the eschar and slough.  Iodine to left great toe wound twice daily to keep the eschar stable.  I have added a mattress with a low air loss feature, turning instructions, and Prevalon boots.  PLEASE NOTE:  If the decision is to be aggressive with care and treatment, please consider consulting surgery for the sacral wound for possible surgical debridement.  The patient was to be evaluated at the [facility] tomorrow for this purpose.  Monitor the wound area(s) for worsening of condition such as: Signs/symptoms of infection,  Increase in size,  Development of or worsening of odor, Development of pain, or increased pain at the affected locations.  Notify the medical team if any of these develop.  Thank you for the consult. [place] nurse will not follow at this time.  Please re-consult the [place] team if needed.  Val Riles, RN, MSN, CWOCN, CNS-BC, pager [phone]

## 2020-01-14 NOTE — ED Provider Notes (Signed)
[facility] EMERGENCY DEPARTMENT Provider Note   CSN: [id number] Arrival date & time: 02/03/2020  3810     History Chief Complaint  Patient presents with  . Fatigue  . Altered Mental Status    Mitchell Farrell. is a 86 y.o. male.  HPI He presents for evaluation of a period of altered mental status.  His grandson, who lives with him, went to check on him this morning and found the patient with decreased responsiveness, foaming at the mouth, and "breathing heavily."  Patient was responsive, and when the grandson rolled him on his back, was able to converse but was confused.  The confusion waxed and waned and the heavy breathing continued, so his grandson called EMS.  EMS evaluated him and decided to transport him.  It is reported that his blood sugar was normal in the field.  The patient is due to see a Psychologist, sport and exercise at the [facility] in [place], [place], tomorrow to evaluate his sacral pressure sore.  Currently it is being treated with cleansing's, during diaper changes, and being followed by home health services.  Patient is typically very interactive, and likes to spend his days, while sitting in a wheelchair, doing "word searches."  Today he is not as responsive or bright as usual.  No other recent illnesses.  Grandson describes pressure sore on the patient's sacral area, is present for 1 month, and worsening, now with a foul odor for several days.  Patient has intermittent lower extremity edema, which gets better when he spends more time in bed, but tends to return as he returns to wheelchair during the days.  Patient is essentially total care, and requires transfer from bed to wheelchair, and assistance with feeding, and clothing.  There are no other known modifying factors.  Note: All history from grandson, patient cooperative but does not report answers to questions.    Past Medical History:  Diagnosis Date  . AF (atrial fibrillation) ([place])   . Arthritis   . Chronic  kidney disease    baseline Cr ~1.6  . GERD (gastroesophageal reflux disease)   . Hip fracture ([place]) 11/11  . Left leg weakness    longstanding and of unclear origin, presumed CVA  . Thyroid disease     Patient Active Problem List   Diagnosis Date Noted  . Pain due to onychomycosis of toenails of both feet 11/17/2019  . PVD (peripheral vascular disease) ([place]) 11/17/2019  . Coagulation disorder ([place]) 11/17/2019  . Neuropathy 11/17/2019  . Memory change 09/24/2016  . PTSD (post-traumatic stress disorder) 09/24/2016  . B12 deficiency 09/24/2016  . GERD (gastroesophageal reflux disease) 12/07/2015  . Cough 12/07/2015  . Diarrhea 12/07/2015  . Leg edema   . Edema 04/01/2015  . Acute CVA (cerebrovascular accident) ([place]) 02/18/2014  . Systolic CHF ([place]) 17/51/0258  . PAD (peripheral artery disease) ([place]) 02/06/2013  . LFT elevation 09/23/2012  . Back pain 03/22/2012  . Foot pain 12/22/2011  . Skin breakdown 12/22/2011  . Itching 09/29/2011  . Numbness and tingling in left hand 07/04/2011  . Dry mouth 07/04/2011  . Abdominal wall pain 07/04/2011  . Esophagitis 06/05/2011  . Acute renal failure superimposed on stage 3 chronic kidney disease ([place]) 06/05/2011  . Colon cancer screening 03/26/2011  . Prostate cancer screening 03/26/2011  . Advance directive discussed with patient 03/26/2011  . OA (osteoarthritis) 01/03/2011  . Left leg weakness 01/03/2011  . Hypothyroidism 01/03/2011  . Warfarin anticoagulation 01/03/2011    Past Surgical History:  Procedure Laterality Date  . EYE SURGERY    . HIP SURGERY         Family History  Problem Relation Age of Onset  . Hypertension Mother   . Throat cancer Father     Social History   Tobacco Use  . Smoking status: Never Smoker  . Smokeless tobacco: Never Used  Substance Use Topics  . Alcohol use: No    Alcohol/week: 0.0 standard drinks  . Drug use: No    Home Medications Prior to Admission medications   Medication  Sig Start Date End Date Taking? Authorizing Provider  acetaminophen (TYLENOL) 500 MG tablet Take 1 tablet (500 mg total) by mouth every 6 (six) hours as needed. Patient taking differently: Take 500 mg by mouth every 12 (twelve) hours.  01/31/16  Yes Tonia Ghent, MD  aspirin 325 MG tablet Take 325 mg by mouth daily.   Yes [provider]  carvedilol (COREG) 3.125 MG tablet Take 1 tablet (3.125 mg total) by mouth 2 (two) times daily with a meal. 03/15/17  Yes Tonia Ghent, MD  dorzolamide (TRUSOPT) 2 % ophthalmic solution Place 1 drop into both eyes 2 (two) times daily. 09/27/19  Yes [provider]  furosemide (LASIX) 20 MG tablet Take 1 tablet (20 mg total) by mouth daily as needed for fluid. 05/17/17  Yes Tonia Ghent, MD  gabapentin (NEURONTIN) 100 MG capsule Take 1-2 capsules (100-200 mg total) by mouth at bedtime. 03/15/17  Yes Tonia Ghent, MD  latanoprost (XALATAN) 0.005 % ophthalmic solution Place 1 drop into both eyes at bedtime.   Yes [provider]  levothyroxine (SYNTHROID, LEVOTHROID) 88 MCG tablet Take 1 tablet (88 mcg total) by mouth daily. 03/15/17 01/21/2020 Yes Tonia Ghent, MD  omeprazole (PRILOSEC) 20 MG capsule TAKE ONE CAPSULE BY MOUTH DAILY Patient taking differently: Take 20 mg by mouth daily.  03/13/17  Yes Tonia Ghent, MD  Polyethyl Glycol-Propyl Glycol (SYSTANE OP) Place 1 drop into both eyes as needed (dry eyes).    Yes [provider]  benzonatate (TESSALON) 200 MG capsule Take 1 capsule (200 mg total) by mouth 2 (two) times daily as needed for cough. Patient not taking: Reported on 02/06/2020 03/15/17   Tonia Ghent, MD  cyanocobalamin (,VITAMIN B-12,) 1000 MCG/ML injection Inject 1 mL (1,000 mcg total) into the muscle every 30 (thirty) days. Patient not taking: Reported on 02/04/2020 03/29/17   Tonia Ghent, MD  loratadine (CLARITIN) 10 MG tablet Take 1 tablet (10 mg total) by mouth daily as needed for allergies. Patient  not taking: Reported on 01/30/2020 03/15/17   Tonia Ghent, MD  warfarin (COUMADIN) 2.5 MG tablet TAKE AS DIRECTED Patient not taking: Reported on 01/12/2020 05/17/17   Tonia Ghent, MD    Allergies    Patient has no known allergies.  Review of Systems   Review of Systems  All other systems reviewed and are negative.   Physical Exam Updated Vital Signs BP 107/76   Pulse (!) 36   Temp 99.4 F (37.4 C) (Rectal)   Resp 20   Ht 5' 9"  (1.753 m)   Wt 88 kg   SpO2 97%   BMI 28.65 kg/m   Physical Exam Vitals and nursing note reviewed.  Constitutional:      General: He is not in acute distress.    Appearance: He is well-developed. He is ill-appearing. He is not toxic-appearing or diaphoretic.  HENT:  Head: Normocephalic and atraumatic.     Right Ear: External ear normal.     Left Ear: External ear normal.  Eyes:     Conjunctiva/sclera: Conjunctivae normal.     Pupils: Pupils are equal, round, and reactive to light.  Neck:     Trachea: Phonation normal.  Cardiovascular:     Rate and Rhythm: Normal rate and regular rhythm.     Heart sounds: Normal heart sounds.  Pulmonary:     Effort: Pulmonary effort is normal. No respiratory distress.     Breath sounds: Normal breath sounds. No stridor.  Abdominal:     General: There is no distension.     Palpations: Abdomen is soft.     Tenderness: There is no abdominal tenderness.  Musculoskeletal:        General: No swelling. Normal range of motion.     Cervical back: Normal range of motion and neck supple.     Right lower leg: Edema present.     Left lower leg: Edema present.     Comments: No large joint deformity.  Skin:    General: Skin is warm and dry.     Comments: Large sacral region pressure sore characterized by necrotic appearing tissue, with mild erythema around the edges, without active drainage or bleeding.  This area is nontender to palpation.  Also, small pressure sore, left medial first MTP region.  Superficial  skin break down, without active drainage or bleeding.  Neurological:     Mental Status: He is alert.     Cranial Nerves: No cranial nerve deficit.     Motor: No abnormal muscle tone.     Coordination: Coordination normal.  Psychiatric:        Mood and Affect: Mood normal.        Behavior: Behavior normal.     ED Results / Procedures / Treatments   Labs (all labs ordered are listed, but only abnormal results are displayed) Labs Reviewed  COMPREHENSIVE METABOLIC PANEL - Abnormal; Notable for the following components:      Result Value   CO2 17 (*)    Glucose, Bld 108 (*)    BUN 32 (*)    Creatinine, Ser 2.13 (*)    Calcium 7.9 (*)    Albumin 1.8 (*)    AST 45 (*)    Total Bilirubin 1.3 (*)    GFR calc non Af Amer 25 (*)    GFR calc Af Amer 29 (*)    All other components within normal limits  CBC - Abnormal; Notable for the following components:   WBC 15.5 (*)    RBC 3.87 (*)    Hemoglobin 10.4 (*)    HCT 33.9 (*)    RDW 15.9 (*)    All other components within normal limits  LACTIC ACID, PLASMA - Abnormal; Notable for the following components:   Lactic Acid, Venous 2.1 (*)    All other components within normal limits  APTT - Abnormal; Notable for the following components:   aPTT 37 (*)    All other components within normal limits  PROTIME-INR - Abnormal; Notable for the following components:   Prothrombin Time 15.9 (*)    INR 1.3 (*)    All other components within normal limits  CULTURE, BLOOD (ROUTINE X 2)  CULTURE, BLOOD (ROUTINE X 2)  URINE CULTURE  SARS CORONAVIRUS 2 (TAT 6-24 HRS)  LACTIC ACID, PLASMA  URINALYSIS, ROUTINE W REFLEX MICROSCOPIC  CBG MONITORING, ED  EKG EKG Interpretation  Date/Time:  Wednesday January 14 2020 09:17:08 EDT Ventricular Rate:  98 PR Interval:    QRS Duration: 107 QT Interval:  380 QTC Calculation: 486 R Axis:   -42 Text Interpretation: Sinus tachycardia Ventricular premature complex Inferior infarct, old Consider anterior  infarct Since last tracing rate faster and artifact resolved Confirmed by Daleen Bo [id number]) on 01/12/2020 9:49:45 AM   Radiology DG Chest Port 1 View  Result Date: 01/12/2020 CLINICAL DATA:  Altered mental status. EXAM: PORTABLE CHEST 1 VIEW COMPARISON:  Chest x-ray dated December 06, 2015. FINDINGS: Stable cardiomediastinal silhouette with normal heart size. Progressive peripheral predominant diffuse interstitial thickening, or predominant peripherally. No consolidation, pneumothorax, or large pleural effusion. No acute osseous abnormality. IMPRESSION: Progressive peripheral predominant diffuse interstitial thickening, which could reflect mild interstitial edema versus atypical pneumonia, including viral pneumonia. Electronically Signed   By: Titus Dubin M.D.   On: 01/26/2020 10:46    Procedures .Critical Care Performed by: Daleen Bo, MD Authorized by: Daleen Bo, MD   Critical care provider statement:    Critical care time (minutes):  75   Critical care start time:  01/15/2020 9:45 AM   Critical care end time:  01/24/2020 1:42 PM   Critical care time was exclusive of:  Separately billable procedures and treating other patients   Critical care was necessary to treat or prevent imminent or life-threatening deterioration of the following conditions:  Sepsis   Critical care was time spent personally by me on the following activities:  Blood draw for specimens, development of treatment plan with patient or surrogate, discussions with consultants, evaluation of patient's response to treatment, examination of patient, obtaining history from patient or surrogate, ordering and performing treatments and interventions, ordering and review of laboratory studies, pulse oximetry, re-evaluation of patient's condition, review of old charts and ordering and review of radiographic studies   (including critical care time)  Medications Ordered in ED Medications  sodium chloride flush (NS) 0.9 %  injection 3 mL (has no administration in time range)  vancomycin (VANCOREADY) IVPB 1250 mg/250 mL (has no administration in time range)  lactated ringers bolus 1,000 mL (0 mLs Intravenous Stopped 01/11/2020 1144)    And  lactated ringers bolus 1,000 mL (0 mLs Intravenous Stopped 01/23/2020 1331)    And  lactated ringers bolus 1,000 mL (1,000 mLs Intravenous New Bag/Given 01/16/2020 1333)  cefTRIAXone (ROCEPHIN) 2 g in sodium chloride 0.9 % 100 mL IVPB (0 g Intravenous Stopped 01/19/2020 1145)  vancomycin (VANCOREADY) IVPB 1500 mg/300 mL (0 mg Intravenous Stopped 01/21/2020 1358)    ED Course  I have reviewed the triage vital signs and the nursing notes.  Pertinent labs & imaging results that were available during my care of the patient were reviewed by me and considered in my medical decision making (see chart for details).  Clinical Course as of Jan 13 1525  Wed Jan 14, 2020  1323 Mild elevation  Lactic acid, plasma(!!) [EW]  1323 Normal except white count high, hemoglobin low  CBC(!) [EW]  1323 Mild elevation of INR  Protime-INR(!) [EW]  1323 Mild elevation of PTT  APTT(!) [EW]  1323 Normal except CO2 low, glucose high, BUN high, creatinine high, calcium low, albumin low, AST high, total bilirubin high, GFR low  Comprehensive metabolic panel(!) [EW]  8182 Nonspecific infiltrates are present, CHF versus atypical pneumonia, interpreted by me  DG Chest [facility] 1 View [EW]    Clinical Course User Index [EW] Daleen Bo, MD   MDM  Rules/Calculators/A&P                       Patient Vitals for the past 24 hrs:  BP Temp Temp src Pulse Resp SpO2 Height Weight  01/17/2020 1345 107/76 -- -- -- 20 97 % -- --  01/23/2020 1330 (!) 131/97 -- -- -- 19 98 % -- --  02/03/2020 1315 114/76 -- -- -- 20 96 % -- --  01/16/2020 1300 93/66 -- -- -- 19 97 % -- --  02/04/2020 1200 100/83 -- -- -- (!) 23 99 % -- --  01/26/2020 1145 108/60 -- -- -- (!) 26 100 % -- --  01/25/2020 1130 105/65 -- -- -- (!) 26 99 % -- --  02/06/2020  1115 (!) 146/66 -- -- -- (!) 27 100 % -- --  01/31/2020 1045 98/67 -- -- (!) 36 (!) 22 100 % -- --  01/23/2020 1040 96/62 -- -- 69 (!) 24 100 % -- --  01/31/2020 1030 (!) 93/56 99.4 F (37.4 C) Rectal 70 (!) 22 100 % -- --  01/21/2020 1015 112/70 -- -- 69 (!) 27 100 % -- --  01/17/2020 0945 104/62 -- -- 75 (!) 28 99 % -- --  02/01/2020 0920 -- -- -- -- -- -- 5' 9"  (1.753 m) 88 kg  01/15/2020 0919 110/64 (!) 97.4 F (36.3 C) Oral 83 (!) 32 99 % -- --  02/04/2020 0916 -- -- -- -- -- 100 % -- --    3:26 PM Reevaluation with update and discussion. After initial assessment and treatment, an updated evaluation reveals no overt change in clinical status. Daleen Bo   Medical Decision Making:  This patient is presenting for evaluation of problems with sacral decubitus, which does require a range of treatment options, and his a complaint that involves a high risk of morbidity and mortality. The differential diagnoses include soft tissue infection, chronic ulcer, osteomyelitis, sepsis, metabolic instability. I decided  to review old records, and in summary chronically ill elderly male, who is debilitated and bedbound.  He is failing outpatient treatment for a decubitus ulcer. I obtained additional historical information from his grandson who is his caretaker. Clinical Laboratory Tests Ordered, included CBC, c-Met, lactate, urinalysis-results reviewed. Radiologic Tests Ordered, included portable chest x-ray-results reviewed. I independently Visualized: Radiologic images, which show nonspecific infiltrates; Cardiac Monitor Tracing which shows normal sinus rhythm;  I discussed the  results with Dr. Tamala Julian, hospitalist.  Wound care consultation, recommended ongoing therapy with frequent turning, boot on left foot, air mattress, wound care every shift, iodine dressing to left foot wound, moistened gauze over sacral wound.   Critical Interventions-empiric antibiotics, and IV fluids for possible sepsis, from soft tissue  source/decubitus ulcer.  Added IV Zithromax, for atypical pulmonary infection  After These Interventions, the Patient was reevaluated and was found with persistent tachypnea.  Creatinine increased from baseline, albumin lower than baseline indicating volume depletion, renal insufficiency, malnutrition.  Patient needs additional evaluation for determination of specific extent of illness relative to pulmonary infection, versus skin soft tissue infection versus combined process.  He needs ongoing wound care management and will require hospitalization for improvement of stability.  No clear cause was found for altered mental status, and is likely multifactorial  CRITICAL CARE-yes Performed by: Daleen Bo   Nursing Notes Reviewed/ Care Coordinated Applicable Imaging Reviewed Interpretation of Laboratory Data incorporated into ED treatment  1:42 PM-Consult complete with hospitalist. Patient case explained and discussed.  He agrees to admit  patient for further evaluation and treatment. Call ended at 3:25 PM  Plan: Admit  Final Clinical Impression(s) / ED Diagnoses Final diagnoses:  Pressure injury of skin of sacral region, unspecified injury stage  Pressure injury of skin of left foot, unspecified injury stage  Altered mental status, unspecified altered mental status type  Sepsis, due to unspecified organism, unspecified whether acute organ dysfunction present [facility])    Rx / DC Orders ED Discharge Orders    None       Daleen Bo, MD 01/31/2020 1526

## 2020-01-14 NOTE — ED Notes (Signed)
Checked patient cbg it was 59 notified RN Marya Amsler patient is resting with call bell in reach and family at bedside

## 2020-01-14 NOTE — H&P (Addendum)
History and Physical    [employer] GEX:[id number] DOB: Feb 16, 1919 DOA: 01/30/2020  Referring MD/NP/PA: Christ Kick, MD PCP: Annetta Maw, MD  Patient coming from: Home via EMS  Chief Complaint: Altered with worsening wound  I have personally briefly reviewed patient's old medical records in [place]   HPI: [place] Far[place]. is a 86 y.o. male with medical history significant of proximal atrial fibrillation, CKD, hypothyroidism, and GERD presented for being altered with worsening wound of his buttocks.  Patient lives at home with his grandson who helps care for him and provides most of his history.  He has additional support with home care from [facility].  Over the last month patient had developed a sacral pressure ulcer that they have been trying to treat at home.  He received his second Covid shot last week and even before he had seem to be declining.  When waking up in the mornings patient was disoriented which was unusual for him.  The sacral wound had become black in appearance since the beginning of this week.  He was scheduled to follow-up at the [facility] hospital in [place] tomorrow.  ED Course: Upon admission into the emergency department patient was noted to be afebrile, pulse 88-77, respirations 18- 28, and all other vital signs within normal limits.  Labs significant for WBC 15.5, hemoglobin 10.4, BUN 32, creatinine 2.13, albumin 1.8 and lactic acid 2.1.  UA positive for small leukocytes, many bacteria, and greater than 50 WBCs.  Chest x-ray taken before IV fluids revealed concern for possible atypical pneumonia versus edema.  Patient was given 3L IV fluids, vancomycin, and Rocephin.  Wound care was consulted.  TRH called to admit.   Review of Systems  Unable to perform ROS: Mental status change  Constitutional: Negative for fever.  Respiratory: Positive for shortness of breath.   Cardiovascular: Positive for leg swelling.  Psychiatric/Behavioral: Positive for memory  loss.    Past Medical History:  Diagnosis Date  . AF (atrial fibrillation) ([place])   . Arthritis   . Chronic kidney disease    baseline Cr ~1.6  . GERD (gastroesophageal reflux disease)   . Hip fracture ([place]) 11/11  . Left leg weakness    longstanding and of unclear origin, presumed CVA  . Thyroid disease     Past Surgical History:  Procedure Laterality Date  . EYE SURGERY    . HIP SURGERY       reports that he has never smoked. He has never used smokeless tobacco. He reports that he does not drink alcohol or use drugs.  No Known Allergies  Family History  Problem Relation Age of Onset  . Hypertension Mother   . Throat cancer Father     Prior to Admission medications   Medication Sig Start Date End Date Taking? Authorizing Provider  acetaminophen (TYLENOL) 500 MG tablet Take 1 tablet (500 mg total) by mouth every 6 (six) hours as needed. Patient taking differently: Take 500 mg by mouth every 12 (twelve) hours.  01/31/16  Yes Tonia Ghent, MD  aspirin 325 MG tablet Take 325 mg by mouth daily.   Yes [provider]  carvedilol (COREG) 3.125 MG tablet Take 1 tablet (3.125 mg total) by mouth 2 (two) times daily with a meal. 03/15/17  Yes Tonia Ghent, MD  dorzolamide (TRUSOPT) 2 % ophthalmic solution Place 1 drop into both eyes 2 (two) times daily. 09/27/19  Yes [provider]  furosemide (LASIX) 20 MG tablet Take 1  tablet (20 mg total) by mouth daily as needed for fluid. 05/17/17  Yes Tonia Ghent, MD  gabapentin (NEURONTIN) 100 MG capsule Take 1-2 capsules (100-200 mg total) by mouth at bedtime. 03/15/17  Yes Tonia Ghent, MD  latanoprost (XALATAN) 0.005 % ophthalmic solution Place 1 drop into both eyes at bedtime.   Yes [provider]  levothyroxine (SYNTHROID, LEVOTHROID) 88 MCG tablet Take 1 tablet (88 mcg total) by mouth daily. 03/15/17 01/18/2020 Yes Tonia Ghent, MD  omeprazole (PRILOSEC) 20 MG capsule TAKE ONE CAPSULE BY MOUTH  DAILY Patient taking differently: Take 20 mg by mouth daily.  03/13/17  Yes Tonia Ghent, MD  Polyethyl Glycol-Propyl Glycol (SYSTANE OP) Place 1 drop into both eyes as needed (dry eyes).    Yes [provider]  benzonatate (TESSALON) 200 MG capsule Take 1 capsule (200 mg total) by mouth 2 (two) times daily as needed for cough. Patient not taking: Reported on 01/13/2020 03/15/17   Tonia Ghent, MD  cyanocobalamin (,VITAMIN B-12,) 1000 MCG/ML injection Inject 1 mL (1,000 mcg total) into the muscle every 30 (thirty) days. Patient not taking: Reported on 01/24/2020 03/29/17   Tonia Ghent, MD  loratadine (CLARITIN) 10 MG tablet Take 1 tablet (10 mg total) by mouth daily as needed for allergies. Patient not taking: Reported on 01/25/2020 03/15/17   Tonia Ghent, MD  warfarin (COUMADIN) 2.5 MG tablet TAKE AS DIRECTED Patient not taking: Reported on 01/27/2020 05/17/17   Tonia Ghent, MD    Physical Exam:  Constitutional: Elderly male who appears to be in no acute distress at this time Vitals:   02/01/2020 1300 01/26/2020 1315 01/15/2020 1330 01/23/2020 1345  BP: 93/66 114/76 (!) 131/97 107/76  Pulse:      Resp: 19 20 19 20   Temp:      TempSrc:      SpO2: 97% 96% 98% 97%  Weight:      Height:       Eyes: PERRL, lids and conjunctivae normal ENMT: Mucous membranes are moist. Posterior pharynx clear of any exudate or lesions.  Neck: normal, supple, no masses, no thyromegaly Respiratory: Tachypneic with decreased overall aeration.  Currently on room air with O2 saturations maintained..  Cardiovascular: Irregular rhythm, no murmurs / rubs / gallops.  2-3+ pitting bilateral lower extremity edema. 2+ pedal pulses.  Abdomen: no tenderness, no masses palpated. No hepatosplenomegaly. Bowel sounds positive.  Musculoskeletal: no clubbing / cyanosis. No joint deformity upper and lower extremities. Good ROM, no contractures. Normal muscle tone.  Skin: Sacral ulcer present with erythema around the  edges and dark necrotic appearing tissue in the middle.  Lateral ulcer of the left first digit.     Neurologic: CN 2-12 grossly intact. Sensation intact, DTR normal. Strength 4/5 Psychiatric: Alert and oriented to self and place.    Labs on Admission: I have personally reviewed following labs and imaging studies  CBC: Recent Labs  Lab 01/31/2020 0930  WBC 15.5*  HGB 10.4*  HCT 33.9*  MCV 87.6  PLT 500   Basic Metabolic Panel: Recent Labs  Lab 01/21/2020 0930  NA 138  K 5.0  CL 108  CO2 17*  GLUCOSE 108*  BUN 32*  CREATININE 2.13*  CALCIUM 7.9*   GFR: Estimated Creatinine Clearance: 21.3 mL/min (A) (by C-G formula based on SCr of 2.13 mg/dL (H)). Liver Function Tests: Recent Labs  Lab 01/13/2020 0930  AST 45*  ALT 20  ALKPHOS 124  BILITOT 1.3*  PROT 6.5  ALBUMIN 1.8*   No results for input(s): LIPASE, AMYLASE in the last 168 hours. No results for input(s): AMMONIA in the last 168 hours. Coagulation Profile: Recent Labs  Lab 01/30/2020 1058  INR 1.3*   Cardiac Enzymes: No results for input(s): CKTOTAL, CKMB, CKMBINDEX, TROPONINI in the last 168 hours. BNP (last 3 results) No results for input(s): PROBNP in the last 8760 hours. HbA1C: No results for input(s): HGBA1C in the last 72 hours. CBG: Recent Labs  Lab 01/19/2020 0936  GLUCAP 95   Lipid Profile: No results for input(s): CHOL, HDL, LDLCALC, TRIG, CHOLHDL, LDLDIRECT in the last 72 hours. Thyroid Function Tests: No results for input(s): TSH, T4TOTAL, FREET4, T3FREE, THYROIDAB in the last 72 hours. Anemia Panel: No results for input(s): VITAMINB12, FOLATE, FERRITIN, TIBC, IRON, RETICCTPCT in the last 72 hours. Urine analysis:    Component Value Date/Time   COLORURINE YELLOW 12/06/2015 2123   APPEARANCEUR CLEAR 12/06/2015 2123   LABSPEC 1.019 12/06/2015 2123   PHURINE 6.0 12/06/2015 2123   GLUCOSEU NEGATIVE 12/06/2015 2123   HGBUR NEGATIVE 12/06/2015 2123   [place] NEGATIVE 12/06/2015 2123    [place] NEGATIVE 12/06/2015 2123   PROTEINUR 30 (A) 12/06/2015 2123   UROBILINOGEN 1.0 02/18/2014 0040   NITRITE NEGATIVE 12/06/2015 2123   LEUKOCYTESUR NEGATIVE 12/06/2015 2123   Sepsis Labs: No results found for this or any previous visit (from the past 240 hour(s)).   Radiological Exams on Admission: DG Chest Port 1 View  Result Date: 02/04/2020 CLINICAL DATA:  Altered mental status. EXAM: PORTABLE CHEST 1 VIEW COMPARISON:  Chest x-ray dated December 06, 2015. FINDINGS: Stable cardiomediastinal silhouette with normal heart size. Progressive peripheral predominant diffuse interstitial thickening, or predominant peripherally. No consolidation, pneumothorax, or large pleural effusion. No acute osseous abnormality. IMPRESSION: Progressive peripheral predominant diffuse interstitial thickening, which could reflect mild interstitial edema versus atypical pneumonia, including viral pneumonia. Electronically Signed   By: Titus Dubin M.D.   On: 02/05/2020 10:46    EKG: Independently reviewed.  Sinus rhythm at 98 bpm with PVCs.  Assessment/Plan Sepsis secondary to urinary tract infection: Acute.  Patient presented after being found acutely altered.  WBC elevated at 15.5 and lactic acid elevated up to 2.1.  Urinalysis positive for signs of infection.  Sepsis symptoms also could be related with the decubitus ulcer present of the sacrum. -Admit to a medical telemetry bed -Follow-up blood and urine cultures -Continue empiric antibiotics vancomycin and Rocephin -Check WBC in a.m. -Continue to trend lactic acid levels  Decubitus ulcer and left foot ulcer/wound infection: Decubitus ulcer appears to be a possible source of infection as well..  Wound care was consulted. -Low-air-loss mattress replacement -Add on ESR and CRP  -Continue antibiotics as seen above -Follow-up MRI of the sacrum -Appreciate wound care consult. Continue recommendations provided -Consult Surgery if need in am based off  MRI  Acute encephalopathy: Suspect secondary to infection. -Neuro checks  Pause: While in the emergency department patient was noted to have some pauses on telemetry that did not appear to last more than 2 seconds.. -Holding BB -Follow-up telemetry overnight  Acute kidney injury superimposed on chronic kidney disease: Patient's baseline creatinine previously noted to be around 1.5-1.7 in 2018, patient presents with creatinine elevated up to 2.13 with BUN 32. -Recheck creatinine in a.m.  Systolic CHF: Acute on chronic patient does have a significant amount of edema present, but question of part of this is secondary to third spacing.  He does have a history of  congestive heart failure EF noted to be around 30 to 35% with grade 1 diastolic function and 11/6689. -Strict intake and output -Daily weights -Check BNP  Lower extremity edema: Patient with at least 2-3+ pitting edema of the bilateral lower extremities.   -Elevate lower extremities -Give albumin and Lasix IV 20 mg x1 dose due to soft blood pressure -Reassess in a.m. and determine if additional IV diuresis is needed -Continue to try to increase nutritional status  Paroxysmal atrial fibrillation: Patient previously had been on Coumadin, but grandson notes that this was stopped by his PCP approximately 6 months ago and he was placed on full dose aspirin instead due to significant bruising. -Continue aspirin  Normocytic anemia: Hemoglobin 10.4 g/dL.  No reports of bleeding.  -Continue to monitor  Essential hypertension: Blood pressures were noted to be 86/43 while in patient's room.  Home blood pressure medications include Coreg twice daily. -Hold Coreg  Hypothyroidism: Home medications include levothyroxine 88 mcg daily. -Check TSH -Continue levothyroxine  Hypoalbuminemia: Acute.  Albumin noted to be 1.8. -Ensure in between meals twice daily -Check prealbumin in a.m. -Nutrition consult  DNR: Present on admission  DVT  prophylaxis: Heparin Code Status: DNR Family Communication: Discussed plan of care with the patient's grandson present at bedside Disposition Plan: To be determined Consults called: None Admission status: inpatient  Norval Morton MD [facility] Pager [phone]   If 7PM-7AM, please contact night-coverage [email or web address] Password [email or web address]  01/12/2020, 3:24 PM

## 2020-01-14 NOTE — ED Notes (Signed)
Help get patient undress on the monitor did ekg shown to Dr Eulis Foster patient is resting with call bell in reach and nurse at bedside

## 2020-01-14 NOTE — Progress Notes (Signed)
Pharmacy Antibiotic Note  [place] Far[place]. is a 86 y.o. male admitted on 01/25/2020 with sepsis and cellulitis.  Pharmacy has been consulted for vancomycin dosing. Pt is afebrile but WBC is elevated at 15.5. Scr is elevated above baseline at 2.13.   Plan: Vancomycin 1500mg  IV x 1 then 1250mg  IV Q48H F/u renal fxn, C&S, clinical status and peak/trough at [facility] F/u continuation of gram neg coverage  Height: 5\' 9"  (175.3 cm) Weight: 88 kg (194 lb 0.1 oz) IBW/kg (Calculated) : 70.7  Temp (24hrs), Avg:98.4 F (36.9 C), Min:97.4 F (36.3 C), Max:99.4 F (37.4 C)  Recent Labs  Lab 01/19/2020 0930  WBC 15.5*  CREATININE 2.13*    Estimated Creatinine Clearance: 21.3 mL/min (A) (by C-G formula based on SCr of 2.13 mg/dL (H)).    No Known Allergies  Antimicrobials this admission: Vanc 4/7>> CTX x 1 4/7  Dose adjustments this admission: N/A  Microbiology results: Pending  Thank you for allowing pharmacy to be a part of this patient's care.  [place], [place] 02/06/2020 10:34 AM

## 2020-01-14 NOTE — ED Notes (Signed)
Help roll patient to get a rectal temp, changed patient and put a clean brief on patient is resting with call bell in reach

## 2020-01-15 DIAGNOSIS — E8809 Other disorders of plasma-protein metabolism, not elsewhere classified: Secondary | ICD-10-CM

## 2020-01-15 DIAGNOSIS — G9340 Encephalopathy, unspecified: Secondary | ICD-10-CM | POA: Diagnosis present

## 2020-01-15 DIAGNOSIS — L89150 Pressure ulcer of sacral region, unstageable: Secondary | ICD-10-CM

## 2020-01-15 DIAGNOSIS — R531 Weakness: Secondary | ICD-10-CM

## 2020-01-15 LAB — CBC
HCT: 31.2 % — ABNORMAL LOW (ref 39.0–52.0)
Hemoglobin: 9.7 g/dL — ABNORMAL LOW (ref 13.0–17.0)
MCH: 26.9 pg (ref 26.0–34.0)
MCHC: 31.1 g/dL (ref 30.0–36.0)
MCV: 86.4 fL (ref 80.0–100.0)
Platelets: 272 10*3/uL (ref 150–400)
RBC: 3.61 MIL/uL — ABNORMAL LOW (ref 4.22–5.81)
RDW: 15.9 % — ABNORMAL HIGH (ref 11.5–15.5)
WBC: 19.6 10*3/uL — ABNORMAL HIGH (ref 4.0–10.5)
nRBC: 0 % (ref 0.0–0.2)

## 2020-01-15 LAB — BASIC METABOLIC PANEL
Anion gap: 14 (ref 5–15)
BUN: 37 mg/dL — ABNORMAL HIGH (ref 8–23)
CO2: 19 mmol/L — ABNORMAL LOW (ref 22–32)
Calcium: 7.9 mg/dL — ABNORMAL LOW (ref 8.9–10.3)
Chloride: 107 mmol/L (ref 98–111)
Creatinine, Ser: 2 mg/dL — ABNORMAL HIGH (ref 0.61–1.24)
GFR calc Af Amer: 31 mL/min — ABNORMAL LOW (ref >60)
GFR calc non Af Amer: 27 mL/min — ABNORMAL LOW (ref >60)
Glucose, Bld: 107 mg/dL — ABNORMAL HIGH (ref 70–99)
Potassium: 4.4 mmol/L (ref 3.5–5.1)
Sodium: 140 mmol/L (ref 135–145)

## 2020-01-15 LAB — PREALBUMIN: Prealbumin: <5 mg/dL — ABNORMAL LOW (ref 18–38)

## 2020-01-15 LAB — C-REACTIVE PROTEIN: CRP: 20.6 mg/dL — ABNORMAL HIGH (ref <1.0)

## 2020-01-15 LAB — LACTIC ACID, PLASMA: Lactic Acid, Venous: 2.9 mmol/L (ref 0.5–1.9)

## 2020-01-15 LAB — BRAIN NATRIURETIC PEPTIDE: B Natriuretic Peptide: 1126.6 pg/mL — ABNORMAL HIGH (ref 0.0–100.0)

## 2020-01-15 LAB — SEDIMENTATION RATE: Sed Rate: 69 mm/hr — ABNORMAL HIGH (ref 0–16)

## 2020-01-15 LAB — TSH: TSH: 2.507 u[IU]/mL (ref 0.350–4.500)

## 2020-01-15 MED ORDER — ADULT MULTIVITAMIN W/MINERALS CH
1.0000 | ORAL_TABLET | Freq: Every day | ORAL | Status: DC
  Administered 2020-01-15 – 2020-01-16 (×2): 1 via ORAL
  Filled 2020-01-15 (×3): qty 1

## 2020-01-15 MED ORDER — ALBUMIN HUMAN 25 % IV SOLN
12.5000 g | Freq: Once | INTRAVENOUS | Status: AC
  Administered 2020-01-15: 14:00:00 12.5 g via INTRAVENOUS
  Filled 2020-01-15: qty 50

## 2020-01-15 NOTE — Progress Notes (Signed)
Progress Note    [employer]  [id number] DOB: 13-Jun-1919  DOA: 01/17/2020 PCP: Annetta Maw, MD    Brief Narrative:   Chief complaint: generalized weakness/ams  Medical records reviewed and are as summarized below:  Mitchell Farrell. is a very pleasant WWII veteran (served in [place]) 86 y.o. male with medical history significant of proximal atrial fibrillation, CKD, hypothyroidism, and GERD presented 01/13/2020 for being altered with worsening wound of his buttocks.  Patient lives at home with his grandson who helps care for him. Grandson reports worsening sacral wound, decreased oral intake and increased lethargy for 7 days prior to presentation. The sacral wound had become black in appearance since the beginning of this week.  He was scheduled to follow-up at the [facility] hospital in [place]. Work up in the ED reveals sepsis likely related UTI, possible pna and osteomyelitis, myositis, acute kidney injury superimposed CKD.    Assessment/Plan:   Principal Problem:   Sepsis ([place]) Active Problems:   Acute kidney injury superimposed on chronic kidney disease (HCC)   Unstageable pressure ulcer of sacral region ([place])   Acute lower UTI   Acute encephalopathy   Systolic CHF (HCC)   Leg edema   Normocytic anemia   Generalized weakness   Hypothyroidism   DNR (do not resuscitate)   Hypoalbuminemia   #1. Sepsis. Likely multifactorial I.e. uti, possible pna and osteomyelitis/myositis. Lactic acid elevated,leukocytosis, hypotension, tachypnea and acute kidney injury superimposed on ckd, acute encephalopathy. Received IV fluids and rocephin. Remains afebrile with BP low end of normal.  -continue rocephin and vancomycin -obtain one more lactic acid -palliative care consult.  -wound care per WOC -spoke at length with grandson and daughter who agree no surgical consult, continue antibiotics and palliative care consult to consider goals of care/comfort care only.  -will minimize lab  sticks for now  #2. Pressure ulcer sacral area/osteomyelitis/myositis. OP work up in process. MRI as noted above.  Last 7 days black tissue. Sed rate and CRP elevated.  Evaluated by [place] who opine unstageable PI to sacrum and left great toe recomending bid dressing changes and surgery consult if family interested in aggressive therapy. They are not. See #1.  -antibiotics as noted -air mattress -dressing changes per WOC  #3. UTI.  -rocephin -follow urine culture  #4.acute kidney injury superimposed on CKD III. Creatinine trending up today. He dis receive lasix IV in ED for elevated BNP and xray concerning for edema. He was also provided with IV fluids for #1. Hx chf with ef 30% -monitor -hold nephrotoxins as able -goals of care   #5. Acute encephalopathy. More alert this am. [name] reports usually interactive with fair appetite. Able to make wants and needs known. -see above  #6. Systolic HF. EF 30%. Appears to be third spacing LE. BNP elevated.  Albumin low. Given IV lasix in ED -albumin IV that was ordered last night -intake and output  #7. PAF. EKG with ST at 98. Coumadin stopped 6 months ago due to bruising. -continue asa  #8. Hypertension. Low end of normal.  -monitor -hold home antihypertensive meds  #9. Hypoalbuminemia.  -ablumin   Family Communication/Anticipated D/C date and plan/Code Status   DVT prophylaxis: heparin ordered. Code Status: dnr.  Family Communication: spoke at length to grandson and daughter who interested in talking to palliative care regarding goals of care and consideration of comfort care only.  Disposition Plan: to be determined   Medical Consultants:    None.   Anti-Infectives:  Vancomycin 4/7>>  Rocephin 4/7>>  Subjective:   Eyes close awakens to verbal stimuli. Denies pain/discomfort  Objective:    Vitals:   01/09/2020 2108 01/11/2020 2217 01/15/20 0534 01/15/20 0724  BP: 104/65 (!) 102/55 (!) 109/56 (!) 109/59  Pulse: 60  66 63 60  Resp: 18 18 20 14   Temp:   (!) 97.4 F (36.3 C) (!) 97.5 F (36.4 C)  TempSrc:   Oral Oral  SpO2: 98% 94% 99% 100%  Weight:      Height:        Intake/Output Summary (Last 24 hours) at 01/15/2020 1221 Last data filed at 01/15/2020 0900 Gross per 24 hour  Intake 2540 ml  Output -  Net 2540 ml   Filed Weights   01/08/2020 0920  Weight: 88 kg    Exam: Gen: cachetic no acute distress CV: rrr no mgr 1+ LE edema Resp: resp somewhat shallow, no increased work of breathing, bs distant. No wheeze Abd: flat soft +BS non-tender to palp MS: Joints without swelling/erythema non-tender to palp Skin: large deep sacral pressure ulcer with eschar, no drainage or odor,left great to with PI as well.  Neuro: oriented to self only, lethargic, attempts to follow commands.   Data Reviewed:   I have personally reviewed following labs and imaging studies:  Labs: Labs show the following:   Basic Metabolic Panel: Recent Labs  Lab 01/23/2020 0930 01/15/20 0005  NA 138 140  K 5.0 4.4  CL 108 107  CO2 17* 19*  GLUCOSE 108* 107*  BUN 32* 37*  CREATININE 2.13* 2.00*  CALCIUM 7.9* 7.9*   GFR Estimated Creatinine Clearance: 22.6 mL/min (A) (by C-G formula based on SCr of 2 mg/dL (H)). Liver Function Tests: Recent Labs  Lab 02/03/2020 0930  AST 45*  ALT 20  ALKPHOS 124  BILITOT 1.3*  PROT 6.5  ALBUMIN 1.8*   No results for input(s): LIPASE, AMYLASE in the last 168 hours. No results for input(s): AMMONIA in the last 168 hours. Coagulation profile Recent Labs  Lab 01/16/2020 1058  INR 1.3*    CBC: Recent Labs  Lab 02/01/2020 0930 01/15/20 0005  WBC 15.5* 19.6*  HGB 10.4* 9.7*  HCT 33.9* 31.2*  MCV 87.6 86.4  PLT 296 272   Cardiac Enzymes: No results for input(s): CKTOTAL, CKMB, CKMBINDEX, TROPONINI in the last 168 hours. BNP (last 3 results) No results for input(s): PROBNP in the last 8760 hours. CBG: Recent Labs  Lab 01/29/2020 0936  GLUCAP 95   D-Dimer: No  results for input(s): DDIMER in the last 72 hours. Hgb A1c: No results for input(s): HGBA1C in the last 72 hours. Lipid Profile: No results for input(s): CHOL, HDL, LDLCALC, TRIG, CHOLHDL, LDLDIRECT in the last 72 hours. Thyroid function studies: Recent Labs    01/15/20 0005  TSH 2.507   Anemia work up: No results for input(s): VITAMINB12, FOLATE, FERRITIN, TIBC, IRON, RETICCTPCT in the last 72 hours. Sepsis Labs: Recent Labs  Lab 01/19/2020 0930 01/29/2020 1058 01/25/2020 1535 01/15/20 0005  WBC 15.5*  --   --  19.6*  LATICACIDVEN  --  2.1* 2.4*  --     Microbiology Recent Results (from the past 240 hour(s))  Blood Culture (routine x 2)     Status: None (Preliminary result)   Collection Time: 01/13/2020 10:58 AM   Specimen: BLOOD  Result Value Ref Range Status   Specimen Description BLOOD SITE NOT SPECIFIED  Final   Special Requests   Final  BOTTLES DRAWN AEROBIC ONLY Blood Culture adequate volume   Culture   Final    NO GROWTH < 24 HOURS Performed at [facility], [place] [address], [place], [place] [address]    Report Status PENDING  Incomplete  Blood Culture (routine x 2)     Status: None (Preliminary result)   Collection Time: 01/12/2020 11:45 AM   Specimen: BLOOD RIGHT HAND  Result Value Ref Range Status   Specimen Description BLOOD RIGHT HAND  Final   Special Requests   Final    BOTTLES DRAWN AEROBIC ONLY Blood Culture results may not be optimal due to an inadequate volume of blood received in culture bottles   Culture   Final    NO GROWTH < 24 HOURS Performed at [facility], [place] [address] [place], [place] [address]    Report Status PENDING  Incomplete  SARS CORONAVIRUS 2 (TAT 6-24 HRS) Nasopharyngeal Nasopharyngeal Swab     Status: None   Collection Time: 01/27/2020  1:59 PM   Specimen: Nasopharyngeal Swab  Result Value Ref Range Status   SARS Coronavirus 2 NEGATIVE NEGATIVE Final    Comment: (NOTE) SARS-CoV-2 target nucleic acids are NOT DETECTED.  The SARS-CoV-2 RNA is generally detectable in upper and lower respiratory specimens during the acute phase of infection. Negative results do not preclude SARS-CoV-2 infection, do not rule out co-infections with other pathogens, and should not be used as the sole basis for treatment or other patient management decisions. Negative results must be combined with clinical observations, patient history, and epidemiological information. The expected result is Negative. Fact Sheet for Patients: SugarRoll.be Fact Sheet for Healthcare Providers: https://www.woods-mathews.com/ This test is not yet approved or cleared by the [place] FDA and  has been authorized for detection and/or diagnosis of SARS-CoV-2 by FDA under an Emergency Use Authorization (EUA). This EUA will remain  in effect (meaning this test can be used) for the duration of the COVID-19 declaration under Section 56 4(b)(1) of the Act, 21 U.S.C. section 360bbb-3(b)(1), unless the authorization is terminated or revoked sooner. Performed at [facility], [place] [address], [place], [place] [address]     Procedures and diagnostic studies:  MR SACRUM SI JOINTS WO CONTRAST  Result Date: 01/15/2020 CLINICAL DATA:  Sacral decubitus ulcer. EXAM: MRI PELVIS WITHOUT CONTRAST TECHNIQUE: Multiplanar multisequence MR imaging of the pelvis was performed. No intravenous contrast was administered. COMPARISON:  None. FINDINGS: A sacral decubitus ulcer is noted in the midline and to the right of the sacrum. No abscess is identified. Associated myositis involving the gluteus maximus muscles but no findings for pyomyositis. There is also diffuse myositis involving the left gluteus minimus and medius muscles. Abnormal T2 and STIR signal intensity in the coccyx segments consistent with osteomyelitis. No significant intrapelvic abnormalities are identified. No intrapelvic or presacral abscess. Both hips are  normally located. Right hip hardware is noted. The SI joints are intact. No findings for septic arthritis. IMPRESSION: 1. Sacral decubitus ulcer in the midline and to the right of the sacrum. 2. Osteomyelitis involving the coccyx segments. 3. Myositis involving the left gluteus minimus and medius muscles and both gluteus maximus muscles. 4. No findings for pyomyositis, septic arthritis or intrapelvic abscess. Electronically Signed   By: Marijo Sanes M.D.   On: 01/15/2020 07:14   DG Chest Port 1 View  Result Date: 01/18/2020 CLINICAL DATA:  Altered mental status. EXAM: PORTABLE CHEST 1 VIEW COMPARISON:  Chest x-ray dated December 06, 2015. FINDINGS:  Stable cardiomediastinal silhouette with normal heart size. Progressive peripheral predominant diffuse interstitial thickening, or predominant peripherally. No consolidation, pneumothorax, or large pleural effusion. No acute osseous abnormality. IMPRESSION: Progressive peripheral predominant diffuse interstitial thickening, which could reflect mild interstitial edema versus atypical pneumonia, including viral pneumonia. Electronically Signed   By: Titus Dubin M.D.   On: 01/24/2020 10:46    Medications:   . aspirin  325 mg Oral Daily  . dorzolamide  1 drop Both Eyes BID  . feeding supplement (ENSURE ENLIVE)  237 mL Oral BID BM  . gabapentin  200 mg Oral QHS  . heparin  5,000 Units Subcutaneous Q8H  . latanoprost  1 drop Both Eyes QHS  . levothyroxine  88 mcg Oral Q0600  . multivitamin with minerals  1 tablet Oral Daily  . sodium chloride flush  3 mL Intravenous Once  . sodium chloride flush  3 mL Intravenous Q12H   Continuous Infusions: . albumin human    . cefTRIAXone (ROCEPHIN)  IV    . [START ON 01/16/2020] vancomycin       LOS: 1 day   Radene Gunning NP  [facility]   How to contact the [facility] Attending or Consulting provider [place] or covering provider during after hours [place], for this patient?  1. Check the care team in [facility]  and look for a) attending/consulting [facility] provider listed and b) the [facility] team listed 2. Log into [email or web address] and use [place]'s universal password to access. If you do not have the password, please contact the hospital operator. 3. Locate the [facility] provider you are looking for under [facility] and page to a number that you can be directly reached. 4. If you still have difficulty reaching the provider, please page the [facility] (Director on Call) for the Hospitalists listed on amion for assistance.  01/15/2020, 12:21 PM

## 2020-01-15 NOTE — Progress Notes (Signed)
Pressure ulcer to coccyx is unstable

## 2020-01-15 NOTE — Progress Notes (Signed)
Initial Nutrition Assessment  DOCUMENTATION CODES:   Severe malnutrition in context of chronic illness  INTERVENTION:   -Continue Ensure Enlive po BID, each supplement provides 350 kcal and 20 grams of protein -MVI with minerals daily -Magic cup TID with meals, each supplement provides 290 kcal and 9 grams of protein -Downgrade diet to dysphagia 1 (pureed) for ease of intake -Feeding assistance with meals  NUTRITION DIAGNOSIS:   Severe Malnutrition related to chronic illness(CKD) as evidenced by moderate fat depletion, severe fat depletion, moderate muscle depletion, severe muscle depletion.  GOAL:   Patient will meet greater than or equal to 90% of their needs  MONITOR:   PO intake, Supplement acceptance, Diet advancement, Labs, Weight trends, Skin, I & O's  REASON FOR ASSESSMENT:   Consult Assessment of nutrition requirement/status  ASSESSMENT:   Mitchell Buchanon. is a 86 y.o. male with medical history significant of proximal atrial fibrillation, CKD, hypothyroidism, and GERD presented for being altered with worsening wound of his buttocks.  Patient lives at home with his grandson who helps care for him and provides most of his history.  He has additional support with home care from [facility].  Over the last month patient had developed a sacral pressure ulcer that they have been trying to treat at home.  He received his second Covid shot last week and even before he had seem to be declining.  When waking up in the mornings patient was disoriented which was unusual for him.  The sacral wound had become black in appearance since the beginning of this week.  He was scheduled to follow-up at the [facility] hospital in [place] tomorrow.  Pt admitted with sepsis secondary to UTI.   Reviewed I/O's: +3.4 L x 24 hours  Spoke with pt at bedside, who responded "oh yeah" to most questions answered. No family present to assist in providing additional history.  Per H&P, pt lives at home with  his grandson, who cares for him.   Noted pt with no teeth. He reports he has dentures and eats with them at home. Breakfast tray has been unattempted.   Reviewed wt hx; wt has been stable over the past 3 months.  Palliative has been consulted; pt family considering comfort measures.   Labs reviewed.   NUTRITION - FOCUSED PHYSICAL EXAM:    Most Recent Value  Orbital Region  Severe depletion  Upper Arm Region  Moderate depletion  Thoracic and Lumbar Region  Severe depletion  Buccal Region  Severe depletion  Temple Region  Severe depletion  Clavicle Bone Region  Moderate depletion  Clavicle and Acromion Bone Region  Moderate depletion  Scapular Bone Region  Moderate depletion  Dorsal Hand  Severe depletion  Patellar Region  Severe depletion  Anterior Thigh Region  Severe depletion  Posterior Calf Region  Severe depletion  Edema (RD Assessment)  None  Hair  Reviewed  Eyes  Reviewed  Mouth  Reviewed  Skin  Reviewed  Nails  Reviewed       Diet Order:   Diet Order            DIET - DYS 1 Room service appropriate? Yes with Assist; Fluid consistency: Thin  Diet effective now              EDUCATION NEEDS:   No education needs have been identified at this time  Skin:  Skin Assessment: Skin Integrity Issues: Skin Integrity Issues:: Unstageable Unstageable: coccyx  Last BM:  12/13/19  Height:   Ht Readings  from Last 1 Encounters:  02/06/2020 5\' 9"  (1.753 m)    Weight:   Wt Readings from Last 1 Encounters:  01/19/2020 88 kg    Ideal Body Weight:  72.7 kg  BMI:  Body mass index is 28.65 kg/m.  Estimated Nutritional Needs:   Kcal:  2000-22200  Protein:  90-105 grams  Fluid:  > 2 L    Loistine Chance, RD, LDN, [place] Registered Dietitian II Certified Diabetes Care and Education Specialist Please refer to [facility] for RD and/or RD on-call/weekend/after hours pager

## 2020-01-16 DIAGNOSIS — Z515 Encounter for palliative care: Secondary | ICD-10-CM

## 2020-01-16 DIAGNOSIS — E43 Unspecified severe protein-calorie malnutrition: Secondary | ICD-10-CM | POA: Insufficient documentation

## 2020-01-16 DIAGNOSIS — Z7189 Other specified counseling: Secondary | ICD-10-CM

## 2020-01-16 DIAGNOSIS — Z66 Do not resuscitate: Secondary | ICD-10-CM

## 2020-01-16 LAB — BASIC METABOLIC PANEL
Anion gap: 13 (ref 5–15)
BUN: 34 mg/dL — ABNORMAL HIGH (ref 8–23)
CO2: 19 mmol/L — ABNORMAL LOW (ref 22–32)
Calcium: 7.7 mg/dL — ABNORMAL LOW (ref 8.9–10.3)
Chloride: 110 mmol/L (ref 98–111)
Creatinine, Ser: 1.65 mg/dL — ABNORMAL HIGH (ref 0.61–1.24)
GFR calc Af Amer: 39 mL/min — ABNORMAL LOW (ref >60)
GFR calc non Af Amer: 34 mL/min — ABNORMAL LOW (ref >60)
Glucose, Bld: 102 mg/dL — ABNORMAL HIGH (ref 70–99)
Potassium: 4 mmol/L (ref 3.5–5.1)
Sodium: 142 mmol/L (ref 135–145)

## 2020-01-16 LAB — CBC
HCT: 29.3 % — ABNORMAL LOW (ref 39.0–52.0)
Hemoglobin: 9.2 g/dL — ABNORMAL LOW (ref 13.0–17.0)
MCH: 26.7 pg (ref 26.0–34.0)
MCHC: 31.4 g/dL (ref 30.0–36.0)
MCV: 85.2 fL (ref 80.0–100.0)
Platelets: 289 10*3/uL (ref 150–400)
RBC: 3.44 MIL/uL — ABNORMAL LOW (ref 4.22–5.81)
RDW: 15.9 % — ABNORMAL HIGH (ref 11.5–15.5)
WBC: 15 10*3/uL — ABNORMAL HIGH (ref 4.0–10.5)
nRBC: 0.1 % (ref 0.0–0.2)

## 2020-01-16 NOTE — Consult Note (Signed)
Consultation Note Date: 01/16/2020   Patient Name: Mitchell Farrell.  DOB: July 13, 1919  MRN: [id number]  Age / Sex: 86 y.o., male  PCP: Annetta Maw, MD Referring Physician: Geradine Girt, DO  Reason for Consultation: Establishing goals of care  HPI/Patient Profile:  Per hospitalist note --> Mitchell Farrell. is a very pleasant WWII veteran (served in [place] 86 y.o. male with medical history significant ofproximalatrial fibrillation, CKD, hypothyroidism,andGERDpresented 01/09/2020 for being altered with worsening wound of his buttocks. Patient lives at home with his grandson who helps care for him. Grandson reports worsening sacral wound, decreased oral intake and increased lethargy for 7 days prior to presentation. The sacral wound had become black in appearance since the beginning of this week. He was scheduled to follow-up at the [facility] hospital in [place]. Work up in the ED reveals sepsis likely related UTI, possible pna and osteomyelitis, myositis, acute kidney injury superimposed CKD.    Asked to assist in goals of care conversations in the setting of sacral decubiti.   Clinical Assessment and Goals of Care: I have reviewed medical records including EPIC notes, labs and imaging, received report from bedside RN, assessed the patient.    I met with Mitchell Farrell (grandson) to further discuss diagnosis prognosis, GOC, EOL wishes, disposition and options.   I introduced Palliative Medicine as specialized medical care for people living with serious illness. It focuses on providing relief from the symptoms and stress of a serious illness. The goal is to improve quality of life for both the patient and the family.  I asked Mitchell Farrell to tell me a little about Mitchell Farrell. He shared that he is from [place]. He is a Scientist, research (physical sciences). He use to live in [place]. He worked as at [employer] for thirty  years. He had five children two of whom are deceased. He enjoys watching television, mostly sports when at home. He is of the [employer] and use to enjoy watching the [employer] channel. Mitchell Farrell loves crossword puzzles and completed them frequently leading up to about a week ago.  In terms of Mitchell Farrell's mobility. Up until about six months ago he was able to stand up and pivot to the chair. Since that time he has become more debilitated. Mitchell Farrell shares with me that he is [name]' primary caregiver. He normally gets him up in the mornings into the chair and assists him in getting dressed and setting up his meals. Mitchell Farrell has a CG come in to help him three hours daily five days a week though [employer].   A detailed discussion was had today regarding advanced directives per Mitchell Farrell, his mother, Mitchell Farrell is the HCPOA this is all on file with the [place] .  Concepts specific to code status, artifical feeding and hydration, continued IV antibiotics and rehospitalization was had.  At the present time the patient is a DNR, confirmed with family. They would want mIVF and antibiotics for a trial period of time. If there was a correctable problem the would be open to consideration  of artificial nutrition.   Mitchell Farrell is well service connected with the [place]. Per my conversation with his grandson his goal would be for the patient to transition to a [place] SNF. If he improved and got stronger he could go back home. If he worsening then the conversation would be towards hospice. Family is open to have OP Palliative care follow along.   Discussed the importance of continued conversation with family and their  medical providers regarding overall plan of care and treatment options, ensuring decisions are within the context of the patients values and GOCs.  Decision Maker: Mitchell Farrell [phone]  SUMMARY OF RECOMMENDATIONS   DNAR/DNI  Treat what is treatable  TOC --> OP Palliative Care  TOC --> Placement at a Farrell:  DNR  Palliative Prophylaxis:   Aspiration, Bowel Regimen, Delirium Protocol, Eye Care, Frequent Pain Assessment, Oral Care, Palliative Wound Care and Turn Reposition  Additional Recommendations (Limitations, Scope, Preferences):  Treat what is treatable  Psycho-social/Spiritual:   Desire for further Chaplaincy support: YES  Additional Recommendations: Caregiving  Support/Resources and Education on Hospice  Prognosis:   < 6 months  Discharge Planning: To Be Determined      Primary Diagnoses: Present on Admission: . Sepsis ([place]) . Acute kidney injury superimposed on chronic kidney disease ([place]) . Unstageable pressure ulcer of sacral region ([place]) . Leg edema . Normocytic anemia . DNR (do not resuscitate) . Systolic CHF ([place]) . Hypoalbuminemia . Hypothyroidism . Acute lower UTI . Acute encephalopathy . PAF (paroxysmal atrial fibrillation) ([place])   I have reviewed the medical record, interviewed the patient and family, and examined the patient. The following aspects are pertinent.  Past Medical History:  Diagnosis Date  . AF (atrial fibrillation) ([place])   . Arthritis   . Chronic kidney disease    baseline Cr ~1.6  . GERD (gastroesophageal reflux disease)   . Hip fracture ([place]) 11/11  . Left leg weakness    longstanding and of unclear origin, presumed CVA  . Thyroid disease    Social History   Socioeconomic History  . Marital status: Single    Spouse name: Not on file  . Number of children: Not on file  . Years of education: Not on file  . Highest education level: Not on file  Occupational History  . Not on file  Tobacco Use  . Smoking status: Never Smoker  . Smokeless tobacco: Never Used  Substance and Sexual Activity  . Alcohol use: No    Alcohol/week: 0.0 standard drinks  . Drug use: No  . Sexual activity: Not on file  Other Topics Concern  . Not on file  Social History Narrative   [employer] fan   Wife is demented and in NH in [place]   Family rotates shifts to stay with patient in 1 story home, has a ramp for wheelchair   Uses wheelchair for any transport that isn't short/easy.     Social Determinants of Health   Financial Resource Strain:   . Difficulty of Paying Living Expenses:   Food Insecurity:   . Worried About Charity fundraiser in the Last Year:   . Arboriculturist in the Last Year:   Transportation Needs:   . Film/video editor (Medical):   Marland Kitchen Lack of Transportation (Non-Medical):   Physical Activity:   . Days of Exercise per Week:   . Minutes of Exercise per Session:   Stress:   .  Feeling of Stress :   Social Connections:   . Frequency of Communication with Friends and Family:   . Frequency of Social Gatherings with Friends and Family:   . Attends Religious Services:   . Active Member of Clubs or Organizations:   . Attends Archivist Meetings:   Marland Kitchen Marital Status:    Family History  Problem Relation Age of Onset  . Hypertension Mother   . Throat cancer Father    Scheduled Meds: . aspirin  325 mg Oral Daily  . dorzolamide  1 drop Both Eyes BID  . feeding supplement (ENSURE ENLIVE)  237 mL Oral BID BM  . gabapentin  200 mg Oral QHS  . heparin  5,000 Units Subcutaneous Q8H  . latanoprost  1 drop Both Eyes QHS  . levothyroxine  88 mcg Oral Q0600  . multivitamin with minerals  1 tablet Oral Daily  . sodium chloride flush  3 mL Intravenous Once  . sodium chloride flush  3 mL Intravenous Q12H   Continuous Infusions: . cefTRIAXone (ROCEPHIN)  IV 2 g (01/16/20 1016)  . vancomycin     PRN Meds:.acetaminophen **OR** acetaminophen, albuterol, polyvinyl alcohol Medications Prior to Admission:  Prior to Admission medications   Medication Sig Start Date End Date Taking? Authorizing Provider  acetaminophen (TYLENOL) 500 MG tablet Take 1 tablet (500 mg total) by mouth every 6 (six) hours as needed. Patient taking differently: Take 500 mg  by mouth every 12 (twelve) hours.  01/31/16  Yes Tonia Ghent, MD  aspirin 325 MG tablet Take 325 mg by mouth daily.   Yes [provider]  carvedilol (COREG) 3.125 MG tablet Take 1 tablet (3.125 mg total) by mouth 2 (two) times daily with a meal. 03/15/17  Yes Tonia Ghent, MD  dorzolamide (TRUSOPT) 2 % ophthalmic solution Place 1 drop into both eyes 2 (two) times daily. 09/27/19  Yes [provider]  furosemide (LASIX) 20 MG tablet Take 1 tablet (20 mg total) by mouth daily as needed for fluid. 05/17/17  Yes Tonia Ghent, MD  gabapentin (NEURONTIN) 100 MG capsule Take 1-2 capsules (100-200 mg total) by mouth at bedtime. 03/15/17  Yes Tonia Ghent, MD  latanoprost (XALATAN) 0.005 % ophthalmic solution Place 1 drop into both eyes at bedtime.   Yes [provider]  levothyroxine (SYNTHROID, LEVOTHROID) 88 MCG tablet Take 1 tablet (88 mcg total) by mouth daily. 03/15/17 01/13/2020 Yes Tonia Ghent, MD  omeprazole (PRILOSEC) 20 MG capsule TAKE ONE CAPSULE BY MOUTH DAILY Patient taking differently: Take 20 mg by mouth daily.  03/13/17  Yes Tonia Ghent, MD  Polyethyl Glycol-Propyl Glycol (SYSTANE OP) Place 1 drop into both eyes as needed (dry eyes).    Yes [provider]  benzonatate (TESSALON) 200 MG capsule Take 1 capsule (200 mg total) by mouth 2 (two) times daily as needed for cough. Patient not taking: Reported on 01/29/2020 03/15/17   Tonia Ghent, MD  cyanocobalamin (,VITAMIN B-12,) 1000 MCG/ML injection Inject 1 mL (1,000 mcg total) into the muscle every 30 (thirty) days. Patient not taking: Reported on 02/03/2020 03/29/17   Tonia Ghent, MD  loratadine (CLARITIN) 10 MG tablet Take 1 tablet (10 mg total) by mouth daily as needed for allergies. Patient not taking: Reported on 01/12/2020 03/15/17   Tonia Ghent, MD  warfarin (COUMADIN) 2.5 MG tablet TAKE AS DIRECTED Patient not taking: Reported on 01/19/2020 05/17/17   Tonia Ghent, MD   No  Known  Allergies Review of Systems  Unable to perform ROS: Mental status change   Physical Exam Vitals and nursing note reviewed.  Constitutional:      Appearance: He is ill-appearing.  HENT:     Head: Normocephalic.     Nose: Nose normal.     Mouth/Throat:     Mouth: Mucous membranes are dry.  Eyes:     Pupils: Pupils are equal, round, and reactive to light.  Cardiovascular:     Rate and Rhythm: Rhythm irregular.     Pulses: Normal pulses.  Pulmonary:     Effort: Pulmonary effort is normal.  Abdominal:     Palpations: Abdomen is soft.  Musculoskeletal:        General: Swelling present.     Cervical back: Normal range of motion.  Skin:    General: Skin is dry.     Capillary Refill: Capillary refill takes less than 2 seconds.  Neurological:     Mental Status: He is disoriented.   Vital Signs: BP 104/85 (BP Location: Right Arm)   Pulse 74   Temp 97.7 F (36.5 C) (Oral)   Resp 18   Ht 5' 9"  (1.753 m)   Wt 88 kg   SpO2 100%   BMI 28.65 kg/m  Pain Scale: 0-10   Pain Score: 0-No pain SpO2: SpO2: 100 % O2 Device:SpO2: 100 % O2 Flow Rate: .  IO: Intake/output summary:   Intake/Output Summary (Last 24 hours) at 01/16/2020 1138 Last data filed at 01/16/2020 0900 Gross per 24 hour  Intake 860 ml  Output 800 ml  Net 60 ml  LBM: Last BM Date: 12/13/19 Baseline Weight: Weight: 88 kg Most recent weight: Weight: 88 kg     Palliative Assessment/Data: 20%  Time In: 1000 Time Out:1115 Time Total: 75 Greater than 50%  of this time was spent counseling and coordinating care related to the above assessment and plan.  Signed by: Rosezella Rumpf, NP   Please contact Palliative Medicine Team phone at [phone] for questions and concerns.  For individual provider: See Shea Evans

## 2020-01-16 NOTE — Progress Notes (Signed)
Progress Note    [employer].  XT:[id number] DOB: Jun 22, 1919  DOA: 01/08/2020 PCP: Annetta Maw, MD    Brief Narrative:   Chief complaint: generalized/ams  Medical records reviewed and are as summarized below:  Mitchell Quan. 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 male with medical history significant ofproximalatrial fibrillation, CKD, hypothyroidism,andGERDpresented 02/05/2020 for being altered with worsening wound of his buttocks. Patient lives at home with his grandson who helps care for him. Grandson reported worsening sacral wound, decreased oral intake and increased lethargy for 7 days prior to presentation. The sacral wound became Swati Granberry in appearance since the beginning of this week. He was scheduled to follow-up at the [facility] hospital in [place]. Work up in the ED revealed sepsis likely related UTI, possible pna and osteomyelitis, myositis, acute kidney injury superimposed CKD.    Assessment/Plan:   Principal Problem:   Sepsis ([place]) Active Problems:   Acute kidney injury superimposed on chronic kidney disease (HCC)   Unstageable pressure ulcer of sacral region ([place])   Acute lower UTI   Acute encephalopathy   PAF (paroxysmal atrial fibrillation) (HCC)   Systolic CHF ([place])   Leg edema   Normocytic anemia   Generalized weakness   Hypothyroidism   DNR (do not resuscitate)   Hypoalbuminemia   Protein-calorie malnutrition, severe  #1. Sepsis. Likely multifactorial I.e. uti, possible pna and osteomyelitis/myositis.  Afebrile, leukocytosis improved,  BP stable, no tachypnea. Creatinine trending down and patient eating breakfast with assistance. . Received IV fluids and rocephin. .  -continue rocephin and vancomycin -wound care per [place]  #2. Pressure ulcer sacral area/osteomyelitis/myositis. OP work up in process. MRI as noted above. Evaluated by [facility] who opine unstageable PI to sacrum and left great toe recomending bid dressing changes and  surgery consult if family interested in aggressive therapy. They are not. See #1.  -antibiotics as noted -if no improvement 24-48 hours consult ID  -air mattress -dressing changes per WOC  #3. UTI. Culture with ecoli greater than 30000. -rocephin  #4.acute kidney injury superimposed on CKD III. Creatinine trending down today. He was also provided with IV fluids for #1. Hx chf with ef 30% -monitor -hold nephrotoxins as able -goals of care   #5. Acute encephalopathy. More alert this am. Eating pancakes with assistance this am.  -see above  #6. Systolic HF. EF 30%. -albumin IV x1 -intake and output  #7. PAF. EKG with ST at 98. Coumadin stopped 6 months ago due to bruising. -continue asa  #8. Hypertension. stable -monitor -hold home antihypertensive meds  #9. Hypoalbuminemia.  -ablumin     Family Communication/Anticipated D/C date and plan/Code Status   DVT prophylaxis: Lovenox ordered. Code Status: dnr Family Communication: patient Disposition Plan: snf once more alert and UTI improves   Medical Consultants:       Anti-Infectives:    None  Subjective:   Sitting up in bed eating breakfast with assistance. Denies pain/discomfort  Objective:    Vitals:   01/16/20 0114 01/16/20 0300 01/16/20 0724 01/16/20 0934  BP: (!) 100/56 (!) 110/56 129/67 104/85  Pulse: 67 (!) 58 69 74  Resp: 16 16 16 18   Temp: 97.8 F (36.6 C) 98.1 F (36.7 C) 97.7 F (36.5 C)   TempSrc: Oral Oral Oral   SpO2: 99% 100% 100% 100%  Weight:      Height:        Intake/Output Summary (Last 24 hours) at 01/16/2020 1124 Last data filed at 01/16/2020  0900 Gross per 24 hour  Intake 860 ml  Output 800 ml  Net 60 ml   Filed Weights   02/03/2020 0920  Weight: 88 kg    Exam: Gen: cachetic no acute distress CV: rrr no mgr 1+ LE edema Resp: resp somewhat shallow, no increased work of breathing, bs distant. No wheeze Abd: flat soft +BS non-tender to palp MS: Joints without  swelling/erythema non-tender to palp right foot with boot elevated off bed Skin: large deep sacral pressure ulcer with eschar, no drainage or odor,left great to with PI as well.  Neuro: oriented to self only, alert, attempts to follow commands.    Data Reviewed:   I have personally reviewed following labs and imaging studies:  Labs: Labs show the following:   Basic Metabolic Panel: Recent Labs  Lab 01/17/2020 0930 01/25/2020 0930 01/15/20 0005 01/16/20 0609  NA 138  --  140 142  K 5.0   < > 4.4 4.0  CL 108  --  107 110  CO2 17*  --  19* 19*  GLUCOSE 108*  --  107* 102*  BUN 32*  --  37* 34*  CREATININE 2.13*  --  2.00* 1.65*  CALCIUM 7.9*  --  7.9* 7.7*   < > = values in this interval not displayed.   GFR Estimated Creatinine Clearance: 27.4 mL/min (A) (by C-G formula based on SCr of 1.65 mg/dL (H)). Liver Function Tests: Recent Labs  Lab 01/24/2020 0930  AST 45*  ALT 20  ALKPHOS 124  BILITOT 1.3*  PROT 6.5  ALBUMIN 1.8*   No results for input(s): LIPASE, AMYLASE in the last 168 hours. No results for input(s): AMMONIA in the last 168 hours. Coagulation profile Recent Labs  Lab 01/10/2020 1058  INR 1.3*    CBC: Recent Labs  Lab 01/08/2020 0930 01/15/20 0005 01/16/20 0609  WBC 15.5* 19.6* 15.0*  HGB 10.4* 9.7* 9.2*  HCT 33.9* 31.2* 29.3*  MCV 87.6 86.4 85.2  PLT 296 272 289   Cardiac Enzymes: No results for input(s): CKTOTAL, CKMB, CKMBINDEX, TROPONINI in the last 168 hours. BNP (last 3 results) No results for input(s): PROBNP in the last 8760 hours. CBG: Recent Labs  Lab 01/21/2020 0936  GLUCAP 95   D-Dimer: No results for input(s): DDIMER in the last 72 hours. Hgb A1c: No results for input(s): HGBA1C in the last 72 hours. Lipid Profile: No results for input(s): CHOL, HDL, LDLCALC, TRIG, CHOLHDL, LDLDIRECT in the last 72 hours. Thyroid function studies: Recent Labs    01/15/20 0005  TSH 2.507   Anemia work up: No results for input(s):  VITAMINB12, FOLATE, FERRITIN, TIBC, IRON, RETICCTPCT in the last 72 hours. Sepsis Labs: Recent Labs  Lab 01/09/2020 0930 02/05/2020 1058 01/10/2020 1535 01/15/20 0005 01/15/20 1200 01/16/20 0609  WBC 15.5*  --   --  19.6*  --  15.0*  LATICACIDVEN  --  2.1* 2.4*  --  2.9*  --     Microbiology Recent Results (from the past 240 hour(s))  Blood Culture (routine x 2)     Status: None (Preliminary result)   Collection Time: 01/13/2020 10:58 AM   Specimen: BLOOD  Result Value Ref Range Status   Specimen Description BLOOD SITE NOT SPECIFIED  Final   Special Requests   Final    BOTTLES DRAWN AEROBIC ONLY Blood Culture adequate volume   Culture   Final    NO GROWTH 2 DAYS Performed at [facility], [address],  [place] [address]    Report Status PENDING  Incomplete  Blood Culture (routine x 2)     Status: None (Preliminary result)   Collection Time: 01/26/2020 11:45 AM   Specimen: BLOOD RIGHT HAND  Result Value Ref Range Status   Specimen Description BLOOD RIGHT HAND  Final   Special Requests   Final    BOTTLES DRAWN AEROBIC ONLY Blood Culture results may not be optimal due to an inadequate volume of blood received in culture bottles   Culture   Final    NO GROWTH 2 DAYS Performed at [facility], [place] [address], [place], [place] [address]    Report Status PENDING  Incomplete  SARS CORONAVIRUS 2 (TAT 6-24 HRS) Nasopharyngeal Nasopharyngeal Swab     Status: None   Collection Time: 01/23/2020  1:59 PM   Specimen: Nasopharyngeal Swab  Result Value Ref Range Status   SARS Coronavirus 2 NEGATIVE NEGATIVE Final    Comment: (NOTE) SARS-CoV-2 target nucleic acids are NOT DETECTED. The SARS-CoV-2 RNA is generally detectable in upper and lower respiratory specimens during the acute phase of infection. Negative results do not preclude SARS-CoV-2 infection, do not rule out co-infections with other pathogens, and should not be used as the sole basis for treatment or other  patient management decisions. Negative results must be combined with clinical observations, patient history, and epidemiological information. The expected result is Negative. Fact Sheet for Patients: SugarRoll.be Fact Sheet for Healthcare Providers: https://www.woods-mathews.com/ This test is not yet approved or cleared by the [place] FDA and  has been authorized for detection and/or diagnosis of SARS-CoV-2 by FDA under an Emergency Use Authorization (EUA). This EUA will remain  in effect (meaning this test can be used) for the duration of the COVID-19 declaration under Section 56 4(b)(1) of the Act, 21 U.S.C. section 360bbb-3(b)(1), unless the authorization is terminated or revoked sooner. Performed at [facility], [place] [address] [place], [place] [address]   Urine culture     Status: Abnormal (Preliminary result)   Collection Time: 01/28/2020  3:58 PM   Specimen: In/Out Cath Urine  Result Value Ref Range Status   Specimen Description IN/OUT CATH URINE  Final   Special Requests   Final    NONE Performed at [facility], [place] [address] [place], [place] [address]    Culture 30,000 COLONIES/mL ESCHERICHIA COLI (A)  Final   Report Status PENDING  Incomplete    Procedures and diagnostic studies:  MR SACRUM SI JOINTS WO CONTRAST  Result Date: 01/15/2020 CLINICAL DATA:  Sacral decubitus ulcer. EXAM: MRI PELVIS WITHOUT CONTRAST TECHNIQUE: Multiplanar multisequence MR imaging of the pelvis was performed. No intravenous contrast was administered. COMPARISON:  None. FINDINGS: A sacral decubitus ulcer is noted in the midline and to the right of the sacrum. No abscess is identified. Associated myositis involving the gluteus maximus muscles but no findings for pyomyositis. There is also diffuse myositis involving the left gluteus minimus and medius muscles. Abnormal T2 and STIR signal intensity in the coccyx segments consistent with  osteomyelitis. No significant intrapelvic abnormalities are identified. No intrapelvic or presacral abscess. Both hips are normally located. Right hip hardware is noted. The SI joints are intact. No findings for septic arthritis. IMPRESSION: 1. Sacral decubitus ulcer in the midline and to the right of the sacrum. 2. Osteomyelitis involving the coccyx segments. 3. Myositis involving the left gluteus minimus and medius muscles and both gluteus maximus muscles. 4. No findings for pyomyositis, septic arthritis  or intrapelvic abscess. Electronically Signed   By: Marijo Sanes M.D.   On: 01/15/2020 07:14    Medications:    aspirin  325 mg Oral Daily   dorzolamide  1 drop Both Eyes BID   feeding supplement (ENSURE ENLIVE)  237 mL Oral BID BM   gabapentin  200 mg Oral QHS   heparin  5,000 Units Subcutaneous Q8H   latanoprost  1 drop Both Eyes QHS   levothyroxine  88 mcg Oral Q0600   multivitamin with minerals  1 tablet Oral Daily   sodium chloride flush  3 mL Intravenous Once   sodium chloride flush  3 mL Intravenous Q12H   Continuous Infusions:  cefTRIAXone (ROCEPHIN)  IV 2 g (01/16/20 1016)   vancomycin       LOS: 2 days   Radene Gunning NP [facility]   How to contact the [facility] Attending or Consulting provider [place] or covering provider during after hours [place], for this patient?  1. Check the care team in [facility] and look for a) attending/consulting TR[facility] provider listed and b) the [facility] team listed 2. Log into [email or web address] and use [place]'s universal password to access. If you do not have the password, please contact the hospital operator. 3. Locate the [facility] provider you are looking for under [facility] and page to a number that you can be directly reached. 4. If you still have difficulty reaching the provider, please page the [facility] (Director on Call) for the Hospitalists listed on amion for assistance.  01/16/2020, 11:24 AM

## 2020-01-16 NOTE — Progress Notes (Signed)
0925 Telemetry monitoring called, pt had 11 beats V-tach. I checked the pt, he is talking to his son in the room. Denies chest pain. V/S checked. Message sent to Dr Eliseo Squires.

## 2020-01-16 NOTE — Evaluation (Signed)
Physical Therapy Evaluation Patient Details Name: Mitchell Farrell. MRN: [id number] DOB: June 10, 1919 Today's Date: 01/16/2020   History of Present Illness  Pt is a 86 yo male presenting due to worsening sacral wound with increased lethargy. Upon admission, dx with likely UTI and sepsis as well as AKI on CKD. PMH includes: proximal atrial fibrillation, CKD, hypothyroidism, and GERD.    Clinical Impression  Pt in bed upon arrival of PT, agreeable to evaluation at this time. Prior to admission the pt was living with his grandson, reliant on grandson or aide for all transfers and ADLs, primarily using WC for mobility. The pt now presents with limitations in functional mobility, strength, and stability due to above dx, which are all further complicated by poor command following, and will continue to benefit from skilled PT to address these deficits. Currently unclear how far pt is from his baseline mobility, but the pt may be able to benefit from skilled PT to lessen his dependence on caretakers for all bed mobility and transfers. The pt would benefit greatly from SNF-level rehab prior to d/c home despite grandson willingness to provide 24/7 care.      Follow Up Recommendations SNF;Supervision/Assistance - 24 hour    Equipment Recommendations  Other (comment)(defer to post acute, pt may need pressure relief WC)    Recommendations for Other Services       Precautions / Restrictions Precautions Precautions: Fall Restrictions Weight Bearing Restrictions: No      Mobility  Bed Mobility Overal bed mobility: Needs Assistance Bed Mobility: Rolling;Sidelying to Sit;Sit to Supine Rolling: Max assist Sidelying to sit: Max assist     Sit to sidelying: Max assist General bed mobility comments: pt did initiate some arm movement when asked to roll in bed, was unable to complete any movement without assist, maxA to transition to sitting EOB but pt with strong posterior lean, seemed uncomfortable, and  returned to supine position. maxA of 2 to reposition in bed  Transfers Overall transfer level: (unsafe at this time)                  Ambulation/Gait                Stairs            Wheelchair Mobility    Modified Rankin (Stroke Patients Only)       Balance Overall balance assessment: Needs assistance Sitting-balance support: Bilateral upper extremity supported;Feet supported Sitting balance-Leahy Scale: Poor Sitting balance - Comments: modA of 1 to maintain sitting, strong posterior lean (fearful of falling?) Postural control: Posterior lean   Standing balance-Leahy Scale: Zero                               Pertinent Vitals/Pain Pain Assessment: No/denies pain    Home Living Family/patient expects to be discharged to:: Skilled nursing facility(pt was unable to answer any questions during today's session, was living with grandson [name] prior to admission. Per palliative note, plan is to d/c to [facility] SN[facility].) Living Arrangements: Children             Home Equipment: Gilford Rile - 2 wheels;Bedside commode;Grab bars - tub/shower;Hand held shower head;Tub bench;Wheelchair - manual Additional Comments: Per phone call with grandson [name], ideal d/c plan would involve d/c to [facility] SNF for rehab and strengthening prior to return home.    Prior Function Level of Independence: Needs assistance   Gait / Transfers Assistance Needed: Per  grandson, pt mobilizes only in [facility], has a Paediatric nurse" to help him stand but has not been using that lately, grandson completes transfers  ADL's / Homemaking Assistance Needed: grandson completes all IADLs, either grandson or aide (4 days/wk, 3-5 hrs/day) assist with ADLs        Hand Dominance   Dominant Hand: Right    Extremity/Trunk Assessment   Upper Extremity Assessment Upper Extremity Assessment: Generalized weakness    Lower Extremity Assessment Lower Extremity Assessment: Generalized weakness    Cervical  / Trunk Assessment Cervical / Trunk Assessment: Kyphotic  Communication   Communication: HOH;Expressive difficulties(pt unable to answer questions today, may have been due to [facility] or cog deficits)  Cognition Arousal/Alertness: Lethargic Behavior During Therapy: Flat affect Overall Cognitive Status: Impaired/Different from baseline                                 General Comments: Pt was not able to answer any question posed during therapy session today, he did respond by opening his eyes to his name, but did not answer questions regarding pain, living situation, desire to move, assist needed. Per son, this is abnormal compared to how he was functioning a week ago      General Comments      Exercises     Assessment/Plan    PT Assessment Patient needs continued PT services  PT Problem List Decreased strength;Decreased mobility;Decreased range of motion;Decreased coordination;Decreased activity tolerance;Decreased cognition;Decreased balance;Decreased skin integrity       PT Treatment Interventions Therapeutic exercise;Gait training;Balance training;Functional mobility training;Patient/family education;Therapeutic activities    PT Goals (Current goals can be found in the Care Plan section)  Acute Rehab PT Goals Patient Stated Goal: unable to participate, [facility] then home PT Goal Formulation: With patient/family Time For Goal Achievement: 01/30/20 Potential to Achieve Goals: Fair    Frequency Min 2X/week   Barriers to discharge        Co-evaluation               AM-PAC PT "6 Clicks" Mobility  Outcome Measure Help needed turning from your back to your side while in a flat bed without using bedrails?: A Lot Help needed moving from lying on your back to sitting on the side of a flat bed without using bedrails?: A Lot Help needed moving to and from a bed to a chair (including a wheelchair)?: Total Help needed standing up from a chair using your  arms (e.g., wheelchair or bedside chair)?: Total Help needed to walk in hospital room?: Total Help needed climbing 3-5 steps with a railing? : Total 6 Click Score: 8    End of Session   Activity Tolerance: Patient limited by lethargy;Other (comment)(limited by poor command following, unsafe to progress mobility) Patient left: in bed;with nursing/sitter in room;with call bell/phone within reach Nurse Communication: Mobility status PT Visit Diagnosis: Other abnormalities of gait and mobility (R26.89);Muscle weakness (generalized) (M62.81);Adult, failure to thrive (R62.7)    Time: [id number] PT Time Calculation (min) (ACUTE ONLY): 34 min   Charges:   PT Evaluation $PT Eval Moderate Complexity: 1 Mod PT Treatments $Therapeutic Activity: 8-22 mins        Karma Ganja, PT, DPT   Acute Rehabilitation Department Pager #: [phone]  Otho Bellows 01/16/2020, 1:50 PM

## 2020-01-17 DIAGNOSIS — R531 Weakness: Secondary | ICD-10-CM

## 2020-01-17 DIAGNOSIS — N189 Chronic kidney disease, unspecified: Secondary | ICD-10-CM

## 2020-01-17 DIAGNOSIS — A419 Sepsis, unspecified organism: Secondary | ICD-10-CM

## 2020-01-17 DIAGNOSIS — Z515 Encounter for palliative care: Secondary | ICD-10-CM

## 2020-01-17 DIAGNOSIS — N179 Acute kidney failure, unspecified: Secondary | ICD-10-CM

## 2020-01-17 LAB — BASIC METABOLIC PANEL
Anion gap: 10 (ref 5–15)
BUN: 31 mg/dL — ABNORMAL HIGH (ref 8–23)
CO2: 20 mmol/L — ABNORMAL LOW (ref 22–32)
Calcium: 7.6 mg/dL — ABNORMAL LOW (ref 8.9–10.3)
Chloride: 114 mmol/L — ABNORMAL HIGH (ref 98–111)
Creatinine, Ser: 1.57 mg/dL — ABNORMAL HIGH (ref 0.61–1.24)
GFR calc Af Amer: 42 mL/min — ABNORMAL LOW (ref >60)
GFR calc non Af Amer: 36 mL/min — ABNORMAL LOW (ref >60)
Glucose, Bld: 113 mg/dL — ABNORMAL HIGH (ref 70–99)
Potassium: 3.6 mmol/L (ref 3.5–5.1)
Sodium: 144 mmol/L (ref 135–145)

## 2020-01-17 LAB — CBC
HCT: 27.8 % — ABNORMAL LOW (ref 39.0–52.0)
Hemoglobin: 8.6 g/dL — ABNORMAL LOW (ref 13.0–17.0)
MCH: 26.5 pg (ref 26.0–34.0)
MCHC: 30.9 g/dL (ref 30.0–36.0)
MCV: 85.8 fL (ref 80.0–100.0)
Platelets: 273 10*3/uL (ref 150–400)
RBC: 3.24 MIL/uL — ABNORMAL LOW (ref 4.22–5.81)
RDW: 15.9 % — ABNORMAL HIGH (ref 11.5–15.5)
WBC: 13.9 10*3/uL — ABNORMAL HIGH (ref 4.0–10.5)
nRBC: 0 % (ref 0.0–0.2)

## 2020-01-17 LAB — URINE CULTURE: Culture: 30000 — AB

## 2020-01-17 MED ORDER — CARVEDILOL 3.125 MG PO TABS: 3.1250 mg | ORAL_TABLET | Freq: Two times a day (BID) | ORAL | Status: DC

## 2020-01-17 MED ORDER — LORAZEPAM 2 MG/ML IJ SOLN
0.5000 mg | INTRAMUSCULAR | Status: DC | PRN
  Filled 2020-01-17: qty 1

## 2020-01-17 MED ORDER — BISACODYL 10 MG RE SUPP: 10.0000 mg | Freq: Every day | RECTAL | Status: DC | PRN

## 2020-01-17 MED ORDER — GLYCOPYRROLATE 0.2 MG/ML IJ SOLN
0.4000 mg | Freq: Three times a day (TID) | INTRAMUSCULAR | Status: DC
  Administered 2020-01-17 – 2020-01-18 (×4): 0.4 mg via INTRAVENOUS
  Administered 2020-01-19: 21:00:00 0.2 mg via INTRAVENOUS
  Administered 2020-01-19 – 2020-01-20 (×4): 0.4 mg via INTRAVENOUS
  Filled 2020-01-17 (×11): qty 2

## 2020-01-17 MED ORDER — BIOTENE DRY MOUTH MT LIQD: 15.0000 mL | OROMUCOSAL | Status: DC | PRN

## 2020-01-17 MED ORDER — HYDROMORPHONE HCL 1 MG/ML IJ SOLN
0.5000 mg | INTRAMUSCULAR | Status: AC | PRN
  Administered 2020-01-20: 1 mg via INTRAVENOUS
  Administered 2020-01-20 (×2): 0.5 mg via INTRAVENOUS
  Filled 2020-01-17 (×3): qty 1

## 2020-01-17 MED ORDER — VANCOMYCIN HCL 1500 MG/300ML IV SOLN: 1500.0000 mg | INTRAVENOUS | Status: DC

## 2020-01-17 MED ORDER — ONDANSETRON HCL 4 MG/2ML IJ SOLN: 4.0000 mg | INTRAMUSCULAR | Status: DC | PRN

## 2020-01-17 NOTE — [place] Notes (Signed)
   Palliative Medicine Inpatient Follow Up Note   HPI: Per hospitalist note --> Mitchell Farrellis a very pleasant WWII veteran (served in [employer] y.o.malewith medical history significant ofproximalatrial fibrillation, CKD, hypothyroidism,andGERDpresented04/18/2021for being altered with worsening wound of his buttocks. Patient lives at home with his grandson who helps care for him. Grandson reports worsening sacral wound, decreased oral intake and increased lethargy for 7 days prior to presentation.The sacral wound had become black in appearance since the beginning of this week. He was scheduled to follow-up at the [facility] hospital in [place]. Work up in the ED reveals sepsis likely related UTI, possible pna and osteomyelitis, myositis, acute kidney injury superimposed CKD.  Asked to assist in goals of care conversations in the setting of sacral decubiti.   Today's Discussion (01/17/2020): I reviewed the chart. I met with Mitchell Farrell at bedside, he was rather somnolent at that time. I introduced myself, he did not remember me from the day prior. He did not endorse any chest pain, nausea, or shortness of breath.   We will continue to follow to offer support to the patient and his family.  Vital Signs Vitals:   01/17/20 0512 01/17/20 0745  BP: 112/60 (!) 159/101  Pulse: 78 86  Resp:  16  Temp: 98.4 F (36.9 C) 99.6 F (37.6 C)  SpO2: 95% 98%    Intake/Output Summary (Last 24 hours) at 01/17/2020 1137 Last data filed at 01/17/2020 0600 Gross per 24 hour  Intake 490 ml  Output 401 ml  Net 89 ml   Last Weight  Most recent update: 01/18/2020  9:21 AM   Weight  88 kg (194 lb 0.1 oz)           Physical Exam Vitals and nursing note reviewed.  Constitutional:      Appearance: He is ill-appearing.  HENT:     Head: Normocephalic.     Nose: Nose normal.     Mouth/Throat:     Mouth: Mucous membranes are dry.  Eyes:     Pupils: Pupils are equal, round, and reactive to light.   Cardiovascular:     Rate and Rhythm: Rhythm irregular.     Pulses: Normal pulses.  Pulmonary:     Effort: Pulmonary effort is normal.  Abdominal:     Palpations: Abdomen is soft.  Musculoskeletal:        General: Swelling present.     Cervical back: Normal range of motion.  Skin:    General: Skin is dry.     Capillary Refill: Capillary refill takes less than 2 seconds.  Neurological:     Mental Status: He is disoriented.   SUMMARY OF RECOMMENDATIONS   DNAR/DNI  Treat what is treatable  OP Palliative Care FU  Placement at a [facility] facility, [place] Worker Clayburn Pert [phone]  Chaplain Consult  Time Spent: 15 Greater than 50% of the time was spent in counseling and coordination of care ______________________________________________________________________________________ [place] Team Team Cell Phone: [phone] Please utilize secure chat with additional questions, if there is no response within 30 minutes please call the above phone number  Palliative Medicine Team providers are available by phone from 7am to 7pm daily and can be reached through the team cell phone.  Should this patient require assistance outside of these hours, please call the patient's attending physician.

## 2020-01-17 NOTE — NC FL2 (Signed)
[place] LEVEL OF CARE SCREENING TOOL     IDENTIFICATION  Patient Name: Mitchell Farrell. Birthdate: 05-Nov-1918 Sex: male Admission Date (Current Location): 01/09/2020  [facility] and [place] Number:  Herbalist and Address:  [facility] [place] [address], [place] [address] [place], [place] [address]      Provider Number: [id number]  Attending Physician Name and Address:  Geradine Girt, DO  Relative Name and Phone Number:  [name], daughter Henrietta Dine) [phone]    Current Level of Care: Hospital Recommended Level of Care: [place] Prior Approval Number:    Date Approved/Denied:   PASRR Number: [id number]  Discharge Plan: SNF    Current Diagnoses: Patient Active Problem List   Diagnosis Date Noted  . Protein-calorie malnutrition, severe 01/16/2020  . Palliative care by specialist   . Goals of care, counseling/discussion   . Acute encephalopathy 01/15/2020  . Generalized weakness 01/15/2020  . Sepsis ([place]) 01/13/2020  . Unstageable pressure ulcer of sacral region ([place]) 02/06/2020  . Normocytic anemia 01/08/2020  . DNR (do not resuscitate) 02/06/2020  . Hypoalbuminemia 01/09/2020  . Acute lower UTI 01/13/2020  . Pain due to onychomycosis of toenails of both feet 11/17/2019  . PVD (peripheral vascular disease) ([place]) 11/17/2019  . Coagulation disorder ([place]) 11/17/2019  . Neuropathy 11/17/2019  . Memory change 09/24/2016  . PTSD (post-traumatic stress disorder) 09/24/2016  . B12 deficiency 09/24/2016  . GERD (gastroesophageal reflux disease) 12/07/2015  . Cough 12/07/2015  . Diarrhea 12/07/2015  . Leg edema   . Edema 04/01/2015  . Acute CVA (cerebrovascular accident) ([place]) 02/18/2014  . Systolic CHF ([place]) [id number]  . PAD (peripheral artery disease) ([place]) 02/06/2013  . LFT elevation 09/23/2012  . Back pain 03/22/2012  . Foot pain 12/22/2011  . Skin breakdown 12/22/2011  . Itching 09/29/2011  . Numbness and tingling in  left hand 07/04/2011  . Dry mouth 07/04/2011  . Abdominal wall pain 07/04/2011  . Esophagitis 06/05/2011  . Acute kidney injury superimposed on chronic kidney disease ([place]) 06/05/2011  . Colon cancer screening 03/26/2011  . Prostate cancer screening 03/26/2011  . Advance directive discussed with patient 03/26/2011  . PAF (paroxysmal atrial fibrillation) ([place]) 01/03/2011  . OA (osteoarthritis) 01/03/2011  . Left leg weakness 01/03/2011  . Hypothyroidism 01/03/2011  . Warfarin anticoagulation 01/03/2011    Orientation RESPIRATION BLADDER Height & Weight     Self  O2(Nasal cannula 1L) Incontinent Weight: 194 lb 0.1 oz (88 kg) Height:  5\' 9"  (175.3 cm)  BEHAVIORAL SYMPTOMS/MOOD NEUROLOGICAL BOWEL NUTRITION STATUS      Incontinent Diet(Please see DC Summary)  AMBULATORY STATUS COMMUNICATION OF NEEDS Skin   Extensive Assist Verbally Other (Comment), PU Stage and Appropriate Care(Wound on buttocks, arm, heel; STage II and III on heel; wound on toe and coccyx (gauze dressing twice daily);unstageable PI to sacrum)                       Personal Care Assistance Level of Assistance  Bathing, Feeding, Dressing Bathing Assistance: Maximum assistance Feeding assistance: Maximum assistance Dressing Assistance: Maximum assistance     Functional Limitations Info  Sight, Hearing, Speech Sight Info: Adequate Hearing Info: Adequate Speech Info: Adequate    SPECIAL CARE FACTORS FREQUENCY  PT (By licensed PT), OT (By licensed OT)     PT Frequency: 4x/week OT Frequency: 3x/week            Contractures Contractures Info: Not present    Additional  Factors Info  Code Status, Allergies Code Status Info: DNR Allergies Info: NKA           Current Medications (01/17/2020):  This is the current hospital active medication list Current Facility-Administered Medications  Medication Dose Route Frequency Provider Last Rate Last Admin  . acetaminophen (TYLENOL) tablet 650 mg  650 mg  Oral Q6H PRN Norval Morton, MD       Or  . acetaminophen (TYLENOL) suppository 650 mg  650 mg Rectal Q6H PRN Smith, Rondell A, MD      . albuterol (PROVENTIL) (2.5 MG/3ML) 0.083% nebulizer solution 2.5 mg  2.5 mg Nebulization Q6H PRN Tamala Julian, Rondell A, MD      . aspirin tablet 325 mg  325 mg Oral Daily Tamala Julian, Rondell A, MD   325 mg at 01/16/20 1006  . cefTRIAXone (ROCEPHIN) 2 g in sodium chloride 0.9 % 100 mL IVPB  2 g Intravenous Q24H Smith, Rondell A, MD 200 mL/hr at 01/17/20 1059 2 g at 01/17/20 1059  . dorzolamide (TRUSOPT) 2 % ophthalmic solution 1 drop  1 drop Both Eyes BID Fuller Plan A, MD   1 drop at 01/17/20 1000  . feeding supplement (ENSURE ENLIVE) (ENSURE ENLIVE) liquid 237 mL  237 mL Oral BID BM Smith, Rondell A, MD   237 mL at 01/17/20 1000  . gabapentin (NEURONTIN) capsule 200 mg  200 mg Oral QHS Smith, Rondell A, MD   200 mg at 01/16/20 2205  . heparin injection 5,000 Units  5,000 Units Subcutaneous Q8H Fuller Plan A, MD   5,000 Units at 01/17/20 0618  . latanoprost (XALATAN) 0.005 % ophthalmic solution 1 drop  1 drop Both Eyes QHS Smith, Rondell A, MD   1 drop at 01/16/20 2206  . levothyroxine (SYNTHROID) tablet 88 mcg  88 mcg Oral Q0600 Norval Morton, MD   88 mcg at 01/17/20 0617  . multivitamin with minerals tablet 1 tablet  1 tablet Oral Daily Eulogio Bear U, DO   1 tablet at 01/16/20 1006  . polyvinyl alcohol (LIQUIFILM TEARS) 1.4 % ophthalmic solution   Both Eyes PRN Tamala Julian, Rondell A, MD      . sodium chloride flush (NS) 0.9 % injection 3 mL  3 mL Intravenous Once Smith, Rondell A, MD      . sodium chloride flush (NS) 0.9 % injection 3 mL  3 mL Intravenous Q12H Smith, Rondell A, MD   3 mL at 01/17/20 1000  . [START ON 01/18/2020] vancomycin (VANCOREADY) IVPB 1500 mg/300 mL  1,500 mg Intravenous Q48H Eulogio Bear U, DO         Discharge Medications: Please see discharge summary for a list of discharge medications.  Relevant Imaging Results:  Relevant Lab  Results:   Additional Information SS# [id number]                        COVID negative on 4/7.   May need a few weeks of IV Vancomycin/Rocephin.   Palliative care to follow at SNF.  Benard Halsted, LCSW

## 2020-01-17 NOTE — TOC Initial Note (Addendum)
Transition of Care (TOC) - Initial/Assessment Note    Patient Details  Name: Mitchell Farrell. MRN: [id number] Date of Birth: 14-Jun-1919  Transition of Care [facility]) CM/SW Contact:    Benard Halsted, LCSW Phone Number: 01/17/2020, 1:19 PM  Clinical Narrative:                 CSW received consult for possible SNF placement at time of discharge. CSW spoke with patient's daughter via phone regarding PT recommendation of SNF placement at time of discharge. Patient's daughter reported that patient's grandson is currently unable to care for patient at their home given patient's current physical needs and fall risk. Patient's daughter expressed understanding of PT recommendation and is agreeable to SNF placement at time of discharge. Patient's daughter first reports preference for a [place] SNF but states that she believes they are farther away. She is in agreement to see if any local SNFs can accept patient under his Medicare benefit. She would have a preference for [facility] or [facility] as she has known people that have gone there for rehab. CSW discussed insurance authorization process and provided Medicare SNF ratings list. CSW faxed referral to the [place] for review as well but they will not look at the referral until Monday. Patient's daughter expressed being hopeful for rehab and for patient to feel better soon as he did not wake up when she visited this morning. No further questions reported at this time. CSW to continue to follow and assist with discharge planning needs.   Expected Discharge Plan: Skilled Nursing Facility Barriers to Discharge: Continued Medical Work up   Patient Goals and CMS Choice Patient states their goals for this hospitalization and ongoing recovery are:: Rehab CMS Medicare.gov Compare Post Acute Care list provided to:: Patient Represenative (must comment)(Daughter) Choice offered to / list presented to : Adult Children, HC POA / Guardian  Expected Discharge Plan and Services Expected  Discharge Plan: [place] In-house Referral: Clinical Social Work   Post Acute Care Choice: [place] Living arrangements for the past 2 months: [place]                                      Prior Living Arrangements/Services Living arrangements for the past 2 months: Single Family Home Lives with:: Relatives(Grandson) Patient language and need for interpreter reviewed:: Yes Do you feel safe going back to the place where you live?: Yes      Need for Family Participation in Patient Care: Yes (Comment) Care giver support system in place?: Yes (comment)   Criminal Activity/Legal Involvement Pertinent to Current Situation/Hospitalization: No - Comment as needed  Activities of Daily Living      Permission Sought/Granted Permission sought to share information with : Facility Sport and exercise psychologist, Family Supports Permission granted to share information with : No  Share Information with NAME: [name]  Permission granted to share info w AGENCY: SNFs  Permission granted to share info w Relationship: Daughter (POA)  Permission granted to share info w Contact Information: [phone]  Emotional Assessment Appearance:: Appears stated age Attitude/Demeanor/Rapport: Unable to Assess Affect (typically observed): Unable to Assess Orientation: : Oriented to Self Alcohol / Substance Use: Not Applicable Psych Involvement: No (comment)  Admission diagnosis:  Sepsis ([place]) [A41.9] Altered mental status, unspecified altered mental status type [R41.82] Pressure injury of skin of left foot, unspecified injury stage [L89.899] Pressure injury of skin of sacral region,  unspecified injury stage [L89.159] Sepsis, due to unspecified organism, unspecified whether acute organ dysfunction present [facility]) [A41.9] Patient Active Problem List   Diagnosis Date Noted  . Protein-calorie malnutrition, severe 01/16/2020  . Palliative care by specialist   . Goals  of care, counseling/discussion   . Acute encephalopathy 01/15/2020  . Generalized weakness 01/15/2020  . Sepsis ([place]) 01/19/2020  . Unstageable pressure ulcer of sacral region ([place]) 01/25/2020  . Normocytic anemia 01/13/2020  . DNR (do not resuscitate) 01/17/2020  . Hypoalbuminemia 02/02/2020  . Acute lower UTI 01/19/2020  . Pain due to onychomycosis of toenails of both feet 11/17/2019  . PVD (peripheral vascular disease) ([place]) 11/17/2019  . Coagulation disorder ([place]) 11/17/2019  . Neuropathy 11/17/2019  . Memory change 09/24/2016  . PTSD (post-traumatic stress disorder) 09/24/2016  . B12 deficiency 09/24/2016  . GERD (gastroesophageal reflux disease) 12/07/2015  . Cough 12/07/2015  . Diarrhea 12/07/2015  . Leg edema   . Edema 04/01/2015  . Acute CVA (cerebrovascular accident) ([place]) 02/18/2014  . Systolic CHF ([place]) [id number]  . PAD (peripheral artery disease) ([place]) 02/06/2013  . LFT elevation 09/23/2012  . Back pain 03/22/2012  . Foot pain 12/22/2011  . Skin breakdown 12/22/2011  . Itching 09/29/2011  . Numbness and tingling in left hand 07/04/2011  . Dry mouth 07/04/2011  . Abdominal wall pain 07/04/2011  . Esophagitis 06/05/2011  . Acute kidney injury superimposed on chronic kidney disease ([place]) 06/05/2011  . Colon cancer screening 03/26/2011  . Prostate cancer screening 03/26/2011  . Advance directive discussed with patient 03/26/2011  . PAF (paroxysmal atrial fibrillation) ([place]) 01/03/2011  . OA (osteoarthritis) 01/03/2011  . Left leg weakness 01/03/2011  . Hypothyroidism 01/03/2011  . Warfarin anticoagulation 01/03/2011   PCP:  Annetta Maw, MD Pharmacy:   [facility], [place] [id number], [place] [address] Phone: [phone] Fax: [phone]  [id number] [place], [place] [place] [address] Phone: [phone] Fax:  [place], [place] - [phone] [place] [address] Phone: [phone] Fax: [phone]     Social Determinants of Health ([place]) Interventions    Readmission Risk Interventions Readmission Risk Prevention Plan 01/17/2020  Transportation Screening Complete  PCP or Specialist Appt within 5-7 Days Complete  Home Care Screening Complete  Medication Review (RN CM) Complete  Some recent data might be hidden

## 2020-01-17 NOTE — Progress Notes (Addendum)
Progress Note    [employer]  [id number] DOB: Sep 18, 1919  DOA: 01/27/2020 PCP: Annetta Maw, MD    Brief Narrative:   Chief complaint: generalized/ams  Medical records reviewed and are as summarized below:  Mitchell Quan. 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 male with medical history significant ofproximalatrial fibrillation, CKD, hypothyroidism,andGERDpresented 01/13/2020 for being altered with worsening wound of his buttocks. Patient lives at home with his grandson who helps care for him. Grandson reported worsening sacral wound, decreased oral intake and increased lethargy for 7 days prior to presentation. The sacral wound became black in appearance since the beginning of this week. He was scheduled to follow-up at the [facility] hospital in [place]. Work up in the ED revealed sepsis likely related UTI, possible pna and osteomyelitis, myositis, acute kidney injury superimposed CKD.    Assessment/Plan:   Principal Problem:   Sepsis ([place]) Active Problems:   PAF (paroxysmal atrial fibrillation) (HCC)   Hypothyroidism   Acute kidney injury superimposed on chronic kidney disease (HCC)   Systolic CHF (HCC)   Leg edema   Unstageable pressure ulcer of sacral region ([place])   Normocytic anemia   DNR (do not resuscitate)   Hypoalbuminemia   Acute lower UTI   Acute encephalopathy   Generalized weakness   Protein-calorie malnutrition, severe   Palliative care by specialist   Goals of care, counseling/discussion  #1. Sepsis. Likely multifactorial I.e. uti, possible pna and osteomyelitis/myositis.  Afebrile, leukocytosis improved,  BP stable, no tachypnea.  -. Received IV fluids and rocephin. .  -continue rocephin and vancomycin -wound care per WOC: Family does not want aggressive care like surgery but does want to do a trial of IV antibiotics and rehabilitation  #2. Pressure ulcer sacral area/osteomyelitis/myositis.  - MRI as noted above.  Evaluated by [place] who opine unstageable PI to sacrum and left great toe recomending bid dressing changes and surgery consult if family interested in aggressive therapy.  Again family not interested in aggressive care.  Have asked wound care if there is anything on a more conservative level we can do to help patient -antibiotics as noted -air mattress -dressing changes per WOC  #3. UTI. Culture with ecoli greater than 30000. -rocephin  #4.acute kidney injury superimposed on CKD III. Creatinine trending down today. He was also provided with IV fluids for #1. Hx chf with ef 30% -monitor -hold nephrotoxins as able   #5. Systolic HF. EF 30%. -albumin IV x1 -intake and output  #6. PAF. EKG with ST at 98. Coumadin stopped 6 months ago due to bruising. -continue asa  #7. Hypertension. stable -monitor -hold home antihypertensive meds    My concern is that patient will not do well healing as he is a) not taking in much p.o. intake and b) not able to be off of the sacrum-i.e. ambulate. He also seems to be more sedate and less interactive today  Family Communication/Anticipated D/C date and plan/Code Status   DVT prophylaxis: Lovenox ordered. Code Status: dnr Family Communication: Daughter and had to leave message, also called grandson-- no answer Disposition Plan: Will reach out again to family as I think patient may need to transition more towards hospice as I again have concerns about his ability to heal and it appears over the last several days he has actually declined on antibiotics   Medical Consultants:    Palliative care   Subjective:   Patient is looking uncomfortable, speech difficult to understand  Objective:  Vitals:   01/16/20 1338 01/16/20 2056 01/17/20 0512 01/17/20 0745  BP:  104/77 112/60 (!) 159/101  Pulse: 90 (!) 107 78 86  Resp:  16  16  Temp:  99.5 F (37.5 C) 98.4 F (36.9 C) 99.6 F (37.6 C)  TempSrc:  Axillary Oral Oral  SpO2:  100% 95%  98%  Weight:      Height:        Intake/Output Summary (Last 24 hours) at 01/17/2020 1526 Last data filed at 01/17/2020 1000 Gross per 24 hour  Intake 123 ml  Output 401 ml  Net -278 ml   Filed Weights   01/12/2020 0920  Weight: 88 kg    Exam: Thin, ill appearing rrr Diminished breath sounds as not able to follow commands and take deep breaths Positive bowel sounds, soft, nontender Patient has Prevalon boot Patient speech is difficult to understand   Data Reviewed:   I have personally reviewed following labs and imaging studies:  Labs: Labs show the following:   Basic Metabolic Panel: Recent Labs  Lab 01/10/2020 0930 01/13/2020 0930 01/15/20 0005 01/15/20 0005 01/16/20 0609 01/17/20 1255  NA 138  --  140  --  142 144  K 5.0   < > 4.4   < > 4.0 3.6  CL 108  --  107  --  110 114*  CO2 17*  --  19*  --  19* 20*  GLUCOSE 108*  --  107*  --  102* 113*  BUN 32*  --  37*  --  34* 31*  CREATININE 2.13*  --  2.00*  --  1.65* 1.57*  CALCIUM 7.9*  --  7.9*  --  7.7* 7.6*   < > = values in this interval not displayed.   GFR Estimated Creatinine Clearance: 28.8 mL/min (A) (by C-G formula based on SCr of 1.57 mg/dL (H)). Liver Function Tests: Recent Labs  Lab 01/10/2020 0930  AST 45*  ALT 20  ALKPHOS 124  BILITOT 1.3*  PROT 6.5  ALBUMIN 1.8*   No results for input(s): LIPASE, AMYLASE in the last 168 hours. No results for input(s): AMMONIA in the last 168 hours. Coagulation profile Recent Labs  Lab 01/17/2020 1058  INR 1.3*    CBC: Recent Labs  Lab 02/04/2020 0930 01/15/20 0005 01/16/20 0609 01/17/20 1255  WBC 15.5* 19.6* 15.0* 13.9*  HGB 10.4* 9.7* 9.2* 8.6*  HCT 33.9* 31.2* 29.3* 27.8*  MCV 87.6 86.4 85.2 85.8  PLT 296 272 289 273   Cardiac Enzymes: No results for input(s): CKTOTAL, CKMB, CKMBINDEX, TROPONINI in the last 168 hours. BNP (last 3 results) No results for input(s): PROBNP in the last 8760 hours. CBG: Recent Labs  Lab 02/05/2020 0936    GLUCAP 95   D-Dimer: No results for input(s): DDIMER in the last 72 hours. Hgb A1c: No results for input(s): HGBA1C in the last 72 hours. Lipid Profile: No results for input(s): CHOL, HDL, LDLCALC, TRIG, CHOLHDL, LDLDIRECT in the last 72 hours. Thyroid function studies: Recent Labs    01/15/20 0005  TSH 2.507   Anemia work up: No results for input(s): VITAMINB12, FOLATE, FERRITIN, TIBC, IRON, RETICCTPCT in the last 72 hours. Sepsis Labs: Recent Labs  Lab 01/30/2020 0930 01/21/2020 1058 01/30/2020 1535 01/15/20 0005 01/15/20 1200 01/16/20 0609 01/17/20 1255  WBC 15.5*  --   --  19.6*  --  15.0* 13.9*  LATICACIDVEN  --  2.1* 2.4*  --  2.9*  --   --  Microbiology Recent Results (from the past 240 hour(s))  Blood Culture (routine x 2)     Status: None (Preliminary result)   Collection Time: 01/31/2020 10:58 AM   Specimen: BLOOD  Result Value Ref Range Status   Specimen Description BLOOD SITE NOT SPECIFIED  Final   Special Requests   Final    BOTTLES DRAWN AEROBIC ONLY Blood Culture adequate volume   Culture   Final    NO GROWTH 3 DAYS Performed at [facility], [address] [place], [place] [address]    Report Status PENDING  Incomplete  Blood Culture (routine x 2)     Status: None (Preliminary result)   Collection Time: 01/13/2020 11:45 AM   Specimen: BLOOD RIGHT HAND  Result Value Ref Range Status   Specimen Description BLOOD RIGHT HAND  Final   Special Requests   Final    BOTTLES DRAWN AEROBIC ONLY Blood Culture results may not be optimal due to an inadequate volume of blood received in culture bottles   Culture   Final    NO GROWTH 3 DAYS Performed at [facility], [place] [address] [place], [place] [address]    Report Status PENDING  Incomplete  SARS CORONAVIRUS 2 (TAT 6-24 HRS) Nasopharyngeal Nasopharyngeal Swab     Status: None   Collection Time: 01/10/2020  1:59 PM   Specimen: Nasopharyngeal Swab  Result Value Ref Range Status   SARS Coronavirus 2  NEGATIVE NEGATIVE Final    Comment: (NOTE) SARS-CoV-2 target nucleic acids are NOT DETECTED. The SARS-CoV-2 RNA is generally detectable in upper and lower respiratory specimens during the acute phase of infection. Negative results do not preclude SARS-CoV-2 infection, do not rule out co-infections with other pathogens, and should not be used as the sole basis for treatment or other patient management decisions. Negative results must be combined with clinical observations, patient history, and epidemiological information. The expected result is Negative. Fact Sheet for Patients: SugarRoll.be Fact Sheet for Healthcare Providers: https://www.woods-mathews.com/ This test is not yet approved or cleared by the [place] FDA and  has been authorized for detection and/or diagnosis of SARS-CoV-2 by FDA under an Emergency Use Authorization (EUA). This EUA will remain  in effect (meaning this test can be used) for the duration of the COVID-19 declaration under Section 56 4(b)(1) of the Act, 21 U.S.C. section 360bbb-3(b)(1), unless the authorization is terminated or revoked sooner. Performed at [facility], [place] [address] [place], [place] [address]   Urine culture     Status: Abnormal   Collection Time: 02/01/2020  3:58 PM   Specimen: In/Out Cath Urine  Result Value Ref Range Status   Specimen Description IN/OUT CATH URINE  Final   Special Requests NONE  Final   Culture (A)  Final    30,000 COLONIES/mL ESCHERICHIA COLI >=100,000 COLONIES/mL AEROCOCCUS SPECIES Standardized susceptibility testing for this organism is not available. Performed at [facility], [place] [address] [place], [place] [address]    Report Status 01/17/2020 FINAL  Final   Organism ID, Bacteria ESCHERICHIA COLI (A)  Final      Susceptibility   Escherichia coli - MIC*    AMPICILLIN >=32 RESISTANT Resistant     CEFAZOLIN 8 SENSITIVE Sensitive     CEFTRIAXONE <=0.25  SENSITIVE Sensitive     CIPROFLOXACIN <=0.25 SENSITIVE Sensitive     GENTAMICIN <=1 SENSITIVE Sensitive     IMIPENEM <=0.25 SENSITIVE Sensitive     NITROFURANTOIN <=16 SENSITIVE Sensitive  TRIMETH/SULFA <=20 SENSITIVE Sensitive     AMPICILLIN/SULBACTAM >=32 RESISTANT Resistant     PIP/TAZO <=4 SENSITIVE Sensitive     * 30,000 COLONIES/mL ESCHERICHIA COLI    Procedures and diagnostic studies:  No results found.  Medications:   . aspirin  325 mg Oral Daily  . dorzolamide  1 drop Both Eyes BID  . feeding supplement (ENSURE ENLIVE)  237 mL Oral BID BM  . gabapentin  200 mg Oral QHS  . heparin  5,000 Units Subcutaneous Q8H  . latanoprost  1 drop Both Eyes QHS  . levothyroxine  88 mcg Oral Q0600  . multivitamin with minerals  1 tablet Oral Daily  . sodium chloride flush  3 mL Intravenous Once  . sodium chloride flush  3 mL Intravenous Q12H   Continuous Infusions: . cefTRIAXone (ROCEPHIN)  IV 2 g (01/17/20 1059)  . [START ON 01/18/2020] vancomycin       LOS: 3 days   Geradine Girt DO [facility]   How to contact the [facility] Attending or Consulting provider [place] or covering provider during after hours [place], for this patient?  1. Check the care team in [facility] and look for a) attending/consulting [facility] provider listed and b) the [facility] team listed 2. Log into [email or web address] and use [place]'s universal password to access. If you do not have the password, please contact the hospital operator. 3. Locate the [facility] provider you are looking for under [facility] and page to a number that you can be directly reached. 4. If you still have difficulty reaching the provider, please page the [facility] (Director on Call) for the Hospitalists listed on amion for assistance.  01/17/2020, 3:26 PM

## 2020-01-17 NOTE — Progress Notes (Addendum)
    Palliative Medicine Inpatient Follow Up Note  Goals of Care Consult Note:  Met with [name] (daughter) and [name] (grandson) at bedside. I shared with them the concern that Mitchell Farrell is not improving of antibiotic therapy. We discussed osteomylitis and how severe it can be for patients once diagnosed. They stated that they were hopeful for improvements but have noticed that today he has not woken up or displayed interest in doing anything at all. I shared with them that it might be prudent to consider a more comfort oriented path for him at this point in time.   We talked about transition to comfort measures in house and what that would entail inclusive of medications to control pain, dyspnea, agitation, nausea, itching, and hiccups.  Reviewed that we would stop all uneccessary measures such as blood draws, needle sticks, and frequent vital signs.  Both family members agreed that this would be the best next step. We discussed visitation policy in the setting of comfort measures which they understood.   Utilized reflective listening throughout our time together.   Bedside RN, Sherrie informed of the change in care level.   Dr. Eliseo Squires informed in change in terms of care level.   Questions answered.   SUMMARY OF RECOMMENDATIONS DNAR/DNI  Comfort Measures  Code Status/Advance Care Planning:  DNR  Symptom Management: Dyspnea: Pain: - Dilaudid 0.5-754m IV Q1H PRN Fever: - Tylenol 6559mPO/PR Q6H PRN Agitation: Anxiety: - Lorazepam 0.5-54m854mV Q1H PRN Nausea: - Zofran 4mg40m Q6H PRN Secretions: - Glycopyrrolate 0.4mg 32mTID   Dry Eyes: - Artificial Tears PRN Xerostomia: - Biotene 15ml 56m- BID oral care Urinary Retention:  - Bladder scan QShift if > 250ml p18m a foley catheter Constipation: - Bisacodyl 10mg PR37m  QDay Spiritual:     - Chaplain consult Emotional Support:  - Therapeutic Listening Start: 1700 End: 1745 Tim5456ent: 45 Greater than 50% of the time was spent in counseling and coordination of care ______________________________________________________________________________________ [place] Cell Phone: [phone] secure chat with additional questions, if there is no response within 30 minutes please call the above phone number  Palliative Medicine Team providers are available by phone from 7am to 7pm daily and can be reached through the team cell phone.  Should this patient require assistance outside of these hours, please call the patient's attending physician.

## 2020-01-17 NOTE — Plan of Care (Signed)
  Problem: Education: Goal: Knowledge of General Education information will improve Description Including pain rating scale, medication(s)/side effects and non-pharmacologic comfort measures Outcome: Progressing   

## 2020-01-17 NOTE — Progress Notes (Signed)
IV in Left hand infiltrated. Removed.

## 2020-01-17 NOTE — Progress Notes (Signed)
Pharmacy Antibiotic Note  Mitchell Farrell. is a 86 y.o. male admitted on 02/01/2020 with sepsis and cellulitis.  Pharmacy has been consulted for vancomycin dosing.   MRI revealed sacral osteomyelitis/myositis in addition to unstageable pressure injury to sacrum. Patient family declines aggressive surgical intervention in favor of IV antibiotics. Pt is afebrile with WBC elevated at 15. SCr is trending down to 1.65, approaching baseline ~1.5.  Plan: Increase vancomycin to 1500mg  IV q48h (goal AUC 400-550; expected AUC 470; SCr 1.65) Cefriaxone 2g q24h Monitor clinical picture, renal function, vanc levels  F/U C&S, abx de-escalation, LOT   Height: 5\' 9"  (175.3 cm) Weight: 88 kg (194 lb 0.1 oz) IBW/kg (Calculated) : 70.7  Temp (24hrs), Avg:99.1 F (37.3 C), Min:98.4 F (36.9 C), Max:99.6 F (37.6 C)  Recent Labs  Lab 01/09/2020 0930 01/24/2020 1058 01/16/2020 1535 01/15/20 0005 01/15/20 1200 01/16/20 0609  WBC 15.5*  --   --  19.6*  --  15.0*  CREATININE 2.13*  --   --  2.00*  --  1.65*  LATICACIDVEN  --  2.1* 2.4*  --  2.9*  --     Estimated Creatinine Clearance: 27.4 mL/min (A) (by C-G formula based on SCr of 1.65 mg/dL (H)).    No Known Allergies  Antimicrobials this admission: Vanc 4/7>> CTX 4/7>>  Microbiology results: 4/7 Blood x 2: ngtd 4/7 Ucx:  30K col/ml E. Coli; 100k aerococcus spp 4/7 COVID: negative  Mitchell Farrell, PharmD PGY2 Pharmacy Resident Phone [phone]  01/17/2020   9:59 AM

## 2020-01-18 ENCOUNTER — Other Ambulatory Visit: Payer: Self-pay

## 2020-01-18 NOTE — Plan of Care (Signed)

## 2020-01-18 NOTE — Progress Notes (Signed)
Bladder scan reveled 273ml- indwelling cath placed as ordered-

## 2020-01-18 NOTE — Progress Notes (Signed)
Palliative Medicine Inpatient Follow Up Note   HPI: Per hospitalist note --> Marney Doctor Jr.is a very pleasant WWII veteran (served in [employer] y.o.malewith medical history significant ofproximalatrial fibrillation, CKD, hypothyroidism,andGERDpresented4/7/21for being altered with worsening wound of his buttocks. Patient lives at home with his grandson who helps care for him. Grandson reports worsening sacral wound, decreased oral intake and increased lethargy for 7 days prior to presentation.The sacral wound had become black in appearance since the beginning of this week. He was scheduled to follow-up at the [facility] hospital in [place]. Work up in the ED reveals sepsis likely related UTI, possible pna and osteomyelitis, myositis, acute kidney injury superimposed CKD.  Asked to assist in goals of care conversations in the setting of sacral decubiti.   4/10 - Transitioned to comfort care  Today's Discussion (01/18/2020): I reviewed the chart. I met with [name] at bedside this morning. He said that Jahi has appeared comfortable. He asked me to verify that visitors could come in. I spoke to [place] and verified with her visitation policy. Offered to reach out to me if there are concerns or issues.  If Alfonso remains stable over the next 24 hours we will start the process of transition to a residential hospice home.   Vital Signs Vitals:   01/17/20 1925 01/18/20 0318  BP: (!) 106/55 109/72  Pulse: 60 (!) 101  Resp:    Temp: 98.6 F (37 C) 98.4 F (36.9 C)  SpO2: 100% 96%   No intake or output data in the 24 hours ending 01/18/20 1200 Last Weight  Most recent update: 01/11/2020  9:21 AM   Weight  88 kg (194 lb 0.1 oz)           Physical Exam Vitals and nursing note reviewed.  Constitutional:      Appearance: He is ill-appearing.  HENT:     Head: Normocephalic.     Nose: Nose normal.     Mouth/Throat:     Mouth: Mucous membranes are dry.  Eyes:     Pupils:  Pupils are equal, round, and reactive to light.  Cardiovascular:     Rate and Rhythm: Rhythm irregular.     Pulses: Normal pulses.  Pulmonary:     Effort: Pulmonary effort is normal.  Abdominal:     Palpations: Abdomen is soft.  Musculoskeletal:        General: Swelling present.     Cervical back: Normal range of motion.  Skin:    General: Skin is dry.     Capillary Refill: Capillary refill takes less than 2 seconds.  Neurological:     Mental Status: He is disoriented.   SUMMARY OF RECOMMENDATIONS DNAR/DNI  Comfort Measures  If stable in 24 hours plan to start transfer process for residential hospice  Code Status/Advance Care Planning:  DNR  Symptom Management: Dyspnea: Pain: - Dilaudid 0.5-25m IV Q1H PRN Fever: - Tylenol 6522mPO/PR Q6H PRN Agitation: Anxiety: - Lorazepam 0.5-62m5mV Q1H PRN Nausea: - Zofran 4mg14m Q6H PRN Secretions: - Glycopyrrolate 0.4mg 23mTID   Dry Eyes: - Artificial Tears PRN Xerostomia: - Biotene 15ml 6m- BID oral care Urinary Retention:                 - Bladder scan QShift if > 250ml p562m a foley catheter Constipation: - Bisacodyl 10mg PR67m QDay Spiritual:                    - Chaplain consult Emotional  Support:                 - Therapeutic Listening  Time Spent: 25 Greater than 50% of the time was spent in counseling and coordination of care ______________________________________________________________________________________ [place] Team Team Cell Phone: [phone] Please utilize secure chat with additional questions, if there is no response within 30 minutes please call the above phone number  Palliative Medicine Team providers are available by phone from 7am to 7pm daily and can be reached through the team cell phone.    Should this patient require assistance outside of these hours, please call the patient's attending physician.

## 2020-01-18 NOTE — Progress Notes (Signed)
Progress Note    [employer].  [id number] DOB: [birth year]  DOA: 01/17/2020 PCP: Annetta Maw, MD    Brief Narrative:   Chief complaint: generalized/ams  Medical records reviewed and are as summarized below:  Mitchell Quan. 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 male with medical history significant ofproximalatrial fibrillation, CKD, hypothyroidism,andGERDpresented 01/19/2020 for being altered with worsening wound of his buttocks. Patient lives at home with his grandson who helps care for him. Grandson reported worsening sacral wound, decreased oral intake and increased lethargy for 7 days prior to presentation. The sacral wound became black in appearance since the beginning of this week. He was scheduled to follow-up at the [facility] hospital in [place]. Work up in the ED revealed sepsis likely related UTI, possible pna and osteomyelitis, myositis, acute kidney injury superimposed CKD.    Assessment/Plan:   Principal Problem:   Sepsis ([place]) Active Problems:   PAF (paroxysmal atrial fibrillation) (HCC)   Hypothyroidism   Acute kidney injury superimposed on chronic kidney disease (HCC)   Systolic CHF (HCC)   Leg edema   Unstageable pressure ulcer of sacral region ([place])   Normocytic anemia   DNR (do not resuscitate)   Hypoalbuminemia   Acute lower UTI   Acute encephalopathy   Generalized weakness   Protein-calorie malnutrition, severe   Palliative care by specialist   Goals of care, counseling/discussion   Terminal care  #1. Sepsis. Likely multifactorial I.e. uti, possible pna and osteomyelitis/myositis.  Afebrile, leukocytosis improved,  BP stable, no tachypnea.  Minimal improvement with conservative care -Plan for transition to comfort care and hospice  #2. Pressure ulcer sacral area/osteomyelitis/myositis.  See above, comfort focused  #3. UTI. Culture with ecoli greater than 30000. See above, comfort focused  #4.acute kidney  injury superimposed on CKD III. Creatinine trending down today. He was also provided with IV fluids for #1. Hx chf with ef 30% -Transition towards comfort   #5. Systolic HF. EF 30%. -Transition to comfort  #6. PAF. EKG with ST at 98. Coumadin stopped 6 months ago due to bruising. -Transition to comfort  #7. Hypertension. stable -Transition to comfort    Patient has been transitioned to comfort care   Family Communication/Anticipated D/C date and plan/Code Status   DVT prophylaxis: Lovenox ordered. Code Status: dnr Family Communication: Grandson at bedside Disposition Plan: Comfort focused   Medical Consultants:    Palliative care   Subjective:   Patient in bed, minimally responsive  Objective:    Vitals:   01/17/20 0745 01/17/20 1539 01/17/20 1925 01/18/20 0318  BP: (!) 159/101 112/60 (!) 106/55 109/72  Pulse: 86 73 60 (!) 101  Resp: 16 18    Temp: 99.6 F (37.6 C) 98.3 F (36.8 C) 98.6 F (37 C) 98.4 F (36.9 C)  TempSrc: Oral Oral Oral Oral  SpO2: 98% 100% 100% 96%  Weight:      Height:        Intake/Output Summary (Last 24 hours) at 01/18/2020 1329 Last data filed at 01/18/2020 1300 Gross per 24 hour  Intake 100 ml  Output --  Net 100 ml   Filed Weights   01/10/2020 0920  Weight: 88 kg    Exam: In bed, will open eyes but nonverbal Appears comfortable Foley placed   Data Reviewed:   I have personally reviewed following labs and imaging studies:  Labs: Labs show the following:   Basic Metabolic Panel: Recent Labs  Lab 01/17/2020 0930 01/30/2020 0930 01/15/20  0005 01/15/20 0005 01/16/20 0609 01/17/20 1255  NA 138  --  140  --  142 144  K 5.0   < > 4.4   < > 4.0 3.6  CL 108  --  107  --  110 114*  CO2 17*  --  19*  --  19* 20*  GLUCOSE 108*  --  107*  --  102* 113*  BUN 32*  --  37*  --  34* 31*  CREATININE 2.13*  --  2.00*  --  1.65* 1.57*  CALCIUM 7.9*  --  7.9*  --  7.7* 7.6*   < > = values in this interval not displayed.    GFR Estimated Creatinine Clearance: 28.8 mL/min (A) (by C-G formula based on SCr of 1.57 mg/dL (H)). Liver Function Tests: Recent Labs  Lab 01/27/2020 0930  AST 45*  ALT 20  ALKPHOS 124  BILITOT 1.3*  PROT 6.5  ALBUMIN 1.8*   No results for input(s): LIPASE, AMYLASE in the last 168 hours. No results for input(s): AMMONIA in the last 168 hours. Coagulation profile Recent Labs  Lab 01/13/2020 1058  INR 1.3*    CBC: Recent Labs  Lab 01/08/2020 0930 01/15/20 0005 01/16/20 0609 01/17/20 1255  WBC 15.5* 19.6* 15.0* 13.9*  HGB 10.4* 9.7* 9.2* 8.6*  HCT 33.9* 31.2* 29.3* 27.8*  MCV 87.6 86.4 85.2 85.8  PLT 296 272 289 273   Cardiac Enzymes: No results for input(s): CKTOTAL, CKMB, CKMBINDEX, TROPONINI in the last 168 hours. BNP (last 3 results) No results for input(s): PROBNP in the last 8760 hours. CBG: Recent Labs  Lab 01/25/2020 0936  GLUCAP 95   D-Dimer: No results for input(s): DDIMER in the last 72 hours. Hgb A1c: No results for input(s): HGBA1C in the last 72 hours. Lipid Profile: No results for input(s): CHOL, HDL, LDLCALC, TRIG, CHOLHDL, LDLDIRECT in the last 72 hours. Thyroid function studies: No results for input(s): TSH, T4TOTAL, T3FREE, THYROIDAB in the last 72 hours.  Invalid input(s): FREET3 Anemia work up: No results for input(s): VITAMINB12, FOLATE, FERRITIN, TIBC, IRON, RETICCTPCT in the last 72 hours. Sepsis Labs: Recent Labs  Lab 01/22/2020 0930 01/12/2020 1058 01/23/2020 1535 01/15/20 0005 01/15/20 1200 01/16/20 0609 01/17/20 1255  WBC 15.5*  --   --  19.6*  --  15.0* 13.9*  LATICACIDVEN  --  2.1* 2.4*  --  2.9*  --   --     Microbiology Recent Results (from the past 240 hour(s))  Blood Culture (routine x 2)     Status: None (Preliminary result)   Collection Time: 01/31/2020 10:58 AM   Specimen: BLOOD  Result Value Ref Range Status   Specimen Description BLOOD SITE NOT SPECIFIED  Final   Special Requests   Final    BOTTLES DRAWN AEROBIC  ONLY Blood Culture adequate volume   Culture   Final    NO GROWTH 4 DAYS Performed at [facility], [place] [address] [place], [place] [address]    Report Status PENDING  Incomplete  Blood Culture (routine x 2)     Status: None (Preliminary result)   Collection Time: 01/21/2020 11:45 AM   Specimen: BLOOD RIGHT HAND  Result Value Ref Range Status   Specimen Description BLOOD RIGHT HAND  Final   Special Requests   Final    BOTTLES DRAWN AEROBIC ONLY Blood Culture results may not be optimal due to an inadequate volume of blood received in culture bottles   Culture  Final    NO GROWTH 4 DAYS Performed at [facility], [place] [address], [place], [place] [address]    Report Status PENDING  Incomplete  SARS CORONAVIRUS 2 (TAT 6-24 HRS) Nasopharyngeal Nasopharyngeal Swab     Status: None   Collection Time: 01/22/2020  1:59 PM   Specimen: Nasopharyngeal Swab  Result Value Ref Range Status   SARS Coronavirus 2 NEGATIVE NEGATIVE Final    Comment: (NOTE) SARS-CoV-2 target nucleic acids are NOT DETECTED. The SARS-CoV-2 RNA is generally detectable in upper and lower respiratory specimens during the acute phase of infection. Negative results do not preclude SARS-CoV-2 infection, do not rule out co-infections with other pathogens, and should not be used as the sole basis for treatment or other patient management decisions. Negative results must be combined with clinical observations, patient history, and epidemiological information. The expected result is Negative. Fact Sheet for Patients: SugarRoll.be Fact Sheet for Healthcare Providers: https://www.woods-mathews.com/ This test is not yet approved or cleared by the [place] FDA and  has been authorized for detection and/or diagnosis of SARS-CoV-2 by FDA under an Emergency Use Authorization (EUA). This EUA will remain  in effect (meaning this test can be used) for the duration of the COVID-19  declaration under Section 56 4(b)(1) of the Act, 21 U.S.C. section 360bbb-3(b)(1), unless the authorization is terminated or revoked sooner. Performed at [facility], [place] [address] [place], [place] [address]   Urine culture     Status: Abnormal   Collection Time: 01/12/2020  3:58 PM   Specimen: In/Out Cath Urine  Result Value Ref Range Status   Specimen Description IN/OUT CATH URINE  Final   Special Requests NONE  Final   Culture (A)  Final    30,000 COLONIES/mL ESCHERICHIA COLI >=100,000 COLONIES/mL AEROCOCCUS SPECIES Standardized susceptibility testing for this organism is not available. Performed at [facility], [place] [address] [place],  [address]    Report Status 01/17/2020 FINAL  Final   Organism ID, Bacteria ESCHERICHIA COLI (A)  Final      Susceptibility   Escherichia coli - MIC*    AMPICILLIN >=32 RESISTANT Resistant     CEFAZOLIN 8 SENSITIVE Sensitive     CEFTRIAXONE <=0.25 SENSITIVE Sensitive     CIPROFLOXACIN <=0.25 SENSITIVE Sensitive     GENTAMICIN <=1 SENSITIVE Sensitive     IMIPENEM <=0.25 SENSITIVE Sensitive     NITROFURANTOIN <=16 SENSITIVE Sensitive     TRIMETH/SULFA <=20 SENSITIVE Sensitive     AMPICILLIN/SULBACTAM >=32 RESISTANT Resistant     PIP/TAZO <=4 SENSITIVE Sensitive     * 30,000 COLONIES/mL ESCHERICHIA COLI    Procedures and diagnostic studies:  No results found.  Medications:   . dorzolamide  1 drop Both Eyes BID  . glycopyrrolate  0.4 mg Intravenous TID  . latanoprost  1 drop Both Eyes QHS   Continuous Infusions:    LOS: 4 days   Mitchell Girt DO [facility]   How to contact the [facility] Attending or Consulting provider [place] or covering provider during after hours [place], for this patient?  1. Check the care team in [facility] and look for a) attending/consulting [facility] provider listed and b) the [facility] team listed 2. Log into [email or web address] and use [place]'s universal password to access. If you do not have the  password, please contact the hospital operator. 3. Locate the [facility] provider you are looking for under [facility] and page to a number that you  can be directly reached. 4. If you still have difficulty reaching the provider, please page the [facility] (Director on Call) for the Hospitalists listed on amion for assistance.  01/18/2020, 1:29 PM

## 2020-01-18 NOTE — Plan of Care (Signed)
  Problem: Pain Managment: Goal: General experience of comfort will improve Outcome: Adequate for Discharge   

## 2020-01-19 LAB — CULTURE, BLOOD (ROUTINE X 2)
Culture: NO GROWTH
Culture: NO GROWTH
Special Requests: ADEQUATE

## 2020-01-19 MED ORDER — CHLORHEXIDINE GLUCONATE CLOTH 2 % EX PADS
6.0000 | MEDICATED_PAD | Freq: Every day | CUTANEOUS | Status: DC
  Administered 2020-01-19: 09:00:00 6 via TOPICAL

## 2020-01-19 NOTE — Plan of Care (Signed)
  Problem: Elimination: Goal: Will not experience complications related to bowel motility Outcome: Progressing   

## 2020-01-19 NOTE — Progress Notes (Signed)
Palliative Medicine RN Note: Daily check in. Note that pt was more awake with Dr Eliseo Squires, but he is now asleep. In line with comfort care assessment, I did not attempt to wake him, but he has no outward s/s pain or distress. Breathing is even and unlabored. Wound odor can be detected from the hallway.   Spoke with Dr Eliseo Squires on the phone to discuss discharge plan. Mr Lieber was likely experiencing his terminal surge this morning, and I am thankful his family was here to spend that time with him. While our plan moving forward remains strict comfort care, our team wants to ensure that he will meet residential hospice criteria. If he is going to use his VA benefits, he will need placement at their facility, assuming the family remains amenable to hospice.  I called his grandson [name] [phone]). He will be back at [facility] after 5:00, but I will not be available then. However, I will be back in the morning, and we will plan to meet in Mr Hadley's room late morning [name] has an appt and will not be in as early as he usually is). I described to [name] how comfortable Mr Winkelman looks and reminded him that, should that be different when he is here, the RN has medications she can give Mr Mcgilton for comfort.  Marjie Skiff Patrcia Schnepp, RN, BSN, [facility] Palliative Medicine Team 01/19/2020 3:16 PM Office [phone]

## 2020-01-19 NOTE — Plan of Care (Signed)
  Problem: Clinical Measurements: Goal: Respiratory complications will improve Outcome: Progressing Note: On room air   Problem: Nutrition: Goal: Adequate nutrition will be maintained Outcome: Progressing   Problem: Coping: Goal: Level of anxiety will decrease Outcome: Progressing   Problem: Elimination: Goal: Will not experience complications related to urinary retention Outcome: Progressing Note: Foley in place-end of life care & multiple pressure ulcers   Problem: Pain Managment: Goal: General experience of comfort will improve Outcome: Progressing Note: No pain noted   Problem: Safety: Goal: Ability to remain free from injury will improve Outcome: Progressing

## 2020-01-19 NOTE — Progress Notes (Signed)
Nutrition Brief Note  Chart reviewed. Pt now transitioning to comfort care.  No further nutrition interventions warranted at this time.  Please re-consult as needed.   Al Bracewell W, RD, LDN, CDCES Registered Dietitian II Certified Diabetes Care and Education Specialist Please refer to AMION for RD and/or RD on-call/weekend/after hours pager  

## 2020-01-19 NOTE — Progress Notes (Signed)
Progress Note    [employer]  [id number] DOB: [birth year]  DOA: 01/08/2020 PCP: Annetta Maw, MD    Brief Narrative:   Chief complaint: generalized/ams  Medical records reviewed and are as summarized below:  Mitchell Quan. 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 Patient lives at home with his grandson who helps care for him. Grandson reported worsening sacral wound, decreased oral intake and increased lethargy for 7 days prior to presentation. The sacral wound became black in appearance since the beginning of this week. He was scheduled to follow-up at the [facility] hospital in [place]. Work up in the ED revealed sepsis likely related UTI, possible pna and osteomyelitis, myositis, acute kidney injury superimposed CKD.    Patient's status took a turn for the worse of the week and he was moved to comfort care  Assessment/Plan:   Principal Problem:   Sepsis ([place]) Active Problems:   PAF (paroxysmal atrial fibrillation) ([place])   Hypothyroidism   Acute kidney injury superimposed on chronic kidney disease (HCC)   Systolic CHF ([place])   Leg edema   Unstageable pressure ulcer of sacral region ([place])   Normocytic anemia   DNR (do not resuscitate)   Hypoalbuminemia   Acute lower UTI   Acute encephalopathy   Generalized weakness   Protein-calorie malnutrition, severe   Palliative care by specialist   Goals of care, counseling/discussion   Terminal care  #1. Sepsis. Likely multifactorial I.e. uti, possible pna and osteomyelitis/myositis.  Afebrile, leukocytosis improved,  BP stable, no tachypnea.  Minimal improvement with conservative care - transition to comfort care and hospice  #2. Pressure ulcer sacral area/osteomyelitis/myositis.  See above, comfort focused  #3. UTI. Culture  with ecoli greater than 30000. See above, comfort focused  #4.acute kidney injury superimposed on CKD III. Creatinine trending down today. He was also provided with IV fluids for #1. Hx chf with ef 30% -Transition towards comfort   #5. Systolic HF. EF 30%. -Transition to comfort  #6. PAF. EKG with ST at 98. Coumadin stopped 6 months ago due to bruising. -Transition to comfort  #7. Hypertension. stable -Transition to comfort    Patient has been transitioned to comfort care   Family Communication/Anticipated D/C date and plan/Code Status   Code Status: dnr Family Communication: Grandson at bedside Disposition Plan: Comfort focused   Medical Consultants:    Palliative care   Subjective:   This a.m. patient more awake and interactive than he was yesterday but still not able to understand what he says Per grandson only eating a few bites  Objective:    Vitals:   01/18/20 1706 01/18/20 1933 01/19/20 0316 01/19/20 0821  BP: (!) 108/54 (!) 132/101 109/64 130/83  Pulse: (!) 44 (!) 109 63 (!) 101  Resp:    16  Temp: 98 F (36.7 C) 98.9 F (37.2 C) 98.7 F (37.1 C) (!) 97.4 F (36.3 C)  TempSrc:  Oral Oral Oral  SpO2: 96% 91% 95% 99%  Weight:      Height:        Intake/Output Summary (Last 24 hours) at 01/19/2020 1235 Last data filed at 01/19/2020 1233 Gross per 24 hour  Intake 460 ml  Output 2100 ml  Net -1640 ml   Filed Weights   02/02/2020 0920  Weight: 88 kg    Exam: Patient in bed watching the Price  is right Speaks but voice is difficult to understand Mildly tachycardic appears irregular    Data Reviewed:   I have personally reviewed following labs and imaging studies:  Labs: Labs show the following:   Basic Metabolic Panel: Recent Labs  Lab 01/23/2020 0930 01/18/2020 0930 01/15/20 0005 01/15/20 0005 01/16/20 0609 01/17/20 1255  NA 138  --  140  --  142 144  K 5.0   < > 4.4   < > 4.0 3.6  CL 108  --  107  --  110 114*  CO2 17*  --   19*  --  19* 20*  GLUCOSE 108*  --  107*  --  102* 113*  BUN 32*  --  37*  --  34* 31*  CREATININE 2.13*  --  2.00*  --  1.65* 1.57*  CALCIUM 7.9*  --  7.9*  --  7.7* 7.6*   < > = values in this interval not displayed.   GFR Estimated Creatinine Clearance: 28.8 mL/min (A) (by C-G formula based on SCr of 1.57 mg/dL (H)). Liver Function Tests: Recent Labs  Lab 01/10/2020 0930  AST 45*  ALT 20  ALKPHOS 124  BILITOT 1.3*  PROT 6.5  ALBUMIN 1.8*   No results for input(s): LIPASE, AMYLASE in the last 168 hours. No results for input(s): AMMONIA in the last 168 hours. Coagulation profile Recent Labs  Lab 01/13/2020 1058  INR 1.3*    CBC: Recent Labs  Lab 01/19/2020 0930 01/15/20 0005 01/16/20 0609 01/17/20 1255  WBC 15.5* 19.6* 15.0* 13.9*  HGB 10.4* 9.7* 9.2* 8.6*  HCT 33.9* 31.2* 29.3* 27.8*  MCV 87.6 86.4 85.2 85.8  PLT 296 272 289 273   Cardiac Enzymes: No results for input(s): CKTOTAL, CKMB, CKMBINDEX, TROPONINI in the last 168 hours. BNP (last 3 results) No results for input(s): PROBNP in the last 8760 hours. CBG: Recent Labs  Lab 02/02/2020 0936  GLUCAP 95   D-Dimer: No results for input(s): DDIMER in the last 72 hours. Hgb A1c: No results for input(s): HGBA1C in the last 72 hours. Lipid Profile: No results for input(s): CHOL, HDL, LDLCALC, TRIG, CHOLHDL, LDLDIRECT in the last 72 hours. Thyroid function studies: No results for input(s): TSH, T4TOTAL, T3FREE, THYROIDAB in the last 72 hours.  Invalid input(s): FREET3 Anemia work up: No results for input(s): VITAMINB12, FOLATE, FERRITIN, TIBC, IRON, RETICCTPCT in the last 72 hours. Sepsis Labs: Recent Labs  Lab 01/23/2020 0930 01/09/2020 1058 01/10/2020 1535 01/15/20 0005 01/15/20 1200 01/16/20 0609 01/17/20 1255  WBC 15.5*  --   --  19.6*  --  15.0* 13.9*  LATICACIDVEN  --  2.1* 2.4*  --  2.9*  --   --     Microbiology Recent Results (from the past 240 hour(s))  Blood Culture (routine x 2)     Status:  None   Collection Time: 01/09/2020 10:58 AM   Specimen: BLOOD  Result Value Ref Range Status   Specimen Description BLOOD SITE NOT SPECIFIED  Final   Special Requests   Final    BOTTLES DRAWN AEROBIC ONLY Blood Culture adequate volume   Culture   Final    NO GROWTH 5 DAYS Performed at [facility], [address], [place], [place] [address]    Report Status 01/19/2020 FINAL  Final  Blood Culture (routine x 2)     Status: None   Collection Time: 02/06/2020 11:45 AM   Specimen: BLOOD RIGHT HAND  Result Value Ref Range Status  Specimen Description BLOOD RIGHT HAND  Final   Special Requests   Final    BOTTLES DRAWN AEROBIC ONLY Blood Culture results may not be optimal due to an inadequate volume of blood received in culture bottles   Culture   Final    NO GROWTH 5 DAYS Performed at [facility], [place] [address], [place], [place] [address]    Report Status 01/19/2020 FINAL  Final  SARS CORONAVIRUS 2 (TAT 6-24 HRS) Nasopharyngeal Nasopharyngeal Swab     Status: None   Collection Time: 01/22/2020  1:59 PM   Specimen: Nasopharyngeal Swab  Result Value Ref Range Status   SARS Coronavirus 2 NEGATIVE NEGATIVE Final    Comment: (NOTE) SARS-CoV-2 target nucleic acids are NOT DETECTED. The SARS-CoV-2 RNA is generally detectable in upper and lower respiratory specimens during the acute phase of infection. Negative results do not preclude SARS-CoV-2 infection, do not rule out co-infections with other pathogens, and should not be used as the sole basis for treatment or other patient management decisions. Negative results must be combined with clinical observations, patient history, and epidemiological information. The expected result is Negative. Fact Sheet for Patients: SugarRoll.be Fact Sheet for Healthcare Providers: https://www.woods-mathews.com/ This test is not yet approved or cleared by the [place] FDA and  has been authorized for  detection and/or diagnosis of SARS-CoV-2 by FDA under an Emergency Use Authorization (EUA). This EUA will remain  in effect (meaning this test can be used) for the duration of the COVID-19 declaration under Section 56 4(b)(1) of the Act, 21 U.S.C. section 360bbb-3(b)(1), unless the authorization is terminated or revoked sooner. Performed at [facility], [place] [address], [place], [place] [address]   Urine culture     Status: Abnormal   Collection Time: 02/04/2020  3:58 PM   Specimen: In/Out Cath Urine  Result Value Ref Range Status   Specimen Description IN/OUT CATH URINE  Final   Special Requests NONE  Final   Culture (A)  Final    30,000 COLONIES/mL ESCHERICHIA COLI >=100,000 COLONIES/mL AEROCOCCUS SPECIES Standardized susceptibility testing for this organism is not available. Performed at [facility], [place] [address] [place], [place] [address]    Report Status 01/17/2020 FINAL  Final   Organism ID, Bacteria ESCHERICHIA COLI (A)  Final      Susceptibility   Escherichia coli - MIC*    AMPICILLIN >=32 RESISTANT Resistant     CEFAZOLIN 8 SENSITIVE Sensitive     CEFTRIAXONE <=0.25 SENSITIVE Sensitive     CIPROFLOXACIN <=0.25 SENSITIVE Sensitive     GENTAMICIN <=1 SENSITIVE Sensitive     IMIPENEM <=0.25 SENSITIVE Sensitive     NITROFURANTOIN <=16 SENSITIVE Sensitive     TRIMETH/SULFA <=20 SENSITIVE Sensitive     AMPICILLIN/SULBACTAM >=32 RESISTANT Resistant     PIP/TAZO <=4 SENSITIVE Sensitive     * 30,000 COLONIES/mL ESCHERICHIA COLI    Procedures and diagnostic studies:  No results found.  Medications:   . Chlorhexidine Gluconate Cloth  6 each Topical Q0600  . dorzolamide  1 drop Both Eyes BID  . glycopyrrolate  0.4 mg Intravenous TID  . latanoprost  1 drop Both Eyes QHS   Continuous Infusions:    LOS: 5 days   Geradine Girt DO [facility]   How to contact the [facility] Attending or Consulting provider [place] or covering provider during after  hours [place], for this patient?  1. Check the care team in [facility] and look  for a) attending/consulting [place] provider listed and b) the [facility] team listed 2. Log into [email or web address] and use [place]'s universal password to access. If you do not have the password, please contact the hospital operator. 3. Locate the [facility] provider you are looking for under [facility] and page to a number that you can be directly reached. 4. If you still have difficulty reaching the provider, please page the [facility] (Director on Call) for the Hospitalists listed on amion for assistance.  01/19/2020, 12:35 PM

## 2020-01-20 DIAGNOSIS — I5023 Acute on chronic systolic (congestive) heart failure: Secondary | ICD-10-CM

## 2020-01-20 DIAGNOSIS — L89159 Pressure ulcer of sacral region, unspecified stage: Secondary | ICD-10-CM

## 2020-01-20 DIAGNOSIS — G9340 Encephalopathy, unspecified: Secondary | ICD-10-CM

## 2020-01-20 DIAGNOSIS — I480 Paroxysmal atrial fibrillation: Secondary | ICD-10-CM

## 2020-01-20 DIAGNOSIS — N390 Urinary tract infection, site not specified: Secondary | ICD-10-CM

## 2020-01-20 MED ORDER — LORAZEPAM 2 MG/ML PO CONC
0.5000 mg | ORAL | Status: DC | PRN
  Administered 2020-01-20: 14:00:00 0.5 mg via ORAL
  Filled 2020-01-20: qty 1

## 2020-01-20 MED ORDER — HYDROMORPHONE BOLUS VIA INFUSION
0.5000 mg | INTRAVENOUS | Status: DC | PRN
  Filled 2020-01-20: qty 1

## 2020-01-20 MED ORDER — SCOPOLAMINE 1 MG/3DAYS TD PT72
1.0000 | MEDICATED_PATCH | TRANSDERMAL | Status: DC
  Administered 2020-01-20: 15:00:00 1.5 mg via TRANSDERMAL
  Filled 2020-01-20: qty 1

## 2020-01-20 MED ORDER — SODIUM CHLORIDE 0.9 % IV SOLN
0.5000 mg/h | INTRAVENOUS | Status: DC
  Administered 2020-01-20: 17:00:00 0.5 mg/h via INTRAVENOUS
  Filled 2020-01-20: qty 2.5

## 2020-01-20 MED ORDER — LORAZEPAM 2 MG/ML IJ SOLN
0.5000 mg | Freq: Four times a day (QID) | INTRAMUSCULAR | Status: DC
  Administered 2020-01-20: 0.5 mg via INTRAVENOUS
  Filled 2020-01-20: qty 1

## 2020-01-20 MED ORDER — LORAZEPAM 2 MG/ML IJ SOLN: 1.0000 mg | INTRAMUSCULAR | Status: DC | PRN

## 2020-01-20 MED ORDER — GLYCOPYRROLATE 0.2 MG/ML IJ SOLN
0.4000 mg | Freq: Three times a day (TID) | INTRAMUSCULAR | Status: DC
  Filled 2020-01-20 (×2): qty 2

## 2020-02-07 NOTE — Death Summary Note (Signed)
Death Summary  Mitchell Farrell. [id number] DOB: [birth year] DOA: 01/15/20  PCP: Annetta Maw, MD  Admit date: 01-15-2020 Date of Death: 2020-01-21 Time of Death: 18:40 Notification: Annetta Maw, MD notified of death of 01-21-2020   History of present illness:  Mitchell Farrell. is a very pleasant 86 y.o. male and WWII veteran who [place] in [place] and has a medical history of paroxysmal atrial fibrillation, chronic kidney disease stage III, chronic systolic CHF, hypothyroidism, and pressure ulcers, presented to the hospital on 01-15-20 with altered mental status and worsening in his chronic sacral decubitus ulcer.  He was found to have sepsis with AKI superimposed on his CKD IIIa, suspected secondary to urinary tract infection, was cultured, and started on broad-spectrum antibiotics empirically.  At time of admission, patient's family indicated that the patient would not want to be resuscitated in a code situation and would not want aggressive treatments but agreed with IV antibiotics and supportive care.   Unfortunately, patient's condition failed to improve and actually began to worsen despite antibiotics and fluids.  Palliative care was involved in discussions with the patient's family.  By January 18, 2020, patient's family asked that we transition our goal of care to maintaining patient comfort only.  The patient expired peacefully at 18:40 on 2020/01/21.    Final Diagnoses:  1.   Sepsis secondary to UTI  2.   Decubitus ulcer with osteomyelitis, present on arrival  3.   Acute kidney injury superimposed on CKD IIIa 4.   Atrial fibrillation, paroxysmal  5.   Chronic systolic CHF   6.   Acute toxic-metabolic encephalopathy    The results of significant diagnostics from this hospitalization (including imaging, microbiology, ancillary and laboratory) are listed below for reference.    Significant Diagnostic Studies: MR SACRUM SI JOINTS WO CONTRAST  Result Date:  01/15/2020 CLINICAL DATA:  Sacral decubitus ulcer. EXAM: MRI PELVIS WITHOUT CONTRAST TECHNIQUE: Multiplanar multisequence MR imaging of the pelvis was performed. No intravenous contrast was administered. COMPARISON:  None. FINDINGS: A sacral decubitus ulcer is noted in the midline and to the right of the sacrum. No abscess is identified. Associated myositis involving the gluteus maximus muscles but no findings for pyomyositis. There is also diffuse myositis involving the left gluteus minimus and medius muscles. Abnormal T2 and STIR signal intensity in the coccyx segments consistent with osteomyelitis. No significant intrapelvic abnormalities are identified. No intrapelvic or presacral abscess. Both hips are normally located. Right hip hardware is noted. The SI joints are intact. No findings for septic arthritis. IMPRESSION: 1. Sacral decubitus ulcer in the midline and to the right of the sacrum. 2. Osteomyelitis involving the coccyx segments. 3. Myositis involving the left gluteus minimus and medius muscles and both gluteus maximus muscles. 4. No findings for pyomyositis, septic arthritis or intrapelvic abscess. Electronically Signed   By: Marijo Sanes M.D.   On: 01/15/2020 07:14   DG Chest Port 1 View  Result Date: 2020-01-15 CLINICAL DATA:  Altered mental status. EXAM: PORTABLE CHEST 1 VIEW COMPARISON:  Chest x-ray dated December 06, 2015. FINDINGS: Stable cardiomediastinal silhouette with normal heart size. Progressive peripheral predominant diffuse interstitial thickening, or predominant peripherally. No consolidation, pneumothorax, or large pleural effusion. No acute osseous abnormality. IMPRESSION: Progressive peripheral predominant diffuse interstitial thickening, which could reflect mild interstitial edema versus atypical pneumonia, including viral pneumonia. Electronically Signed   By: Titus Dubin M.D.   On: 01-15-2020 10:46    Microbiology: Recent Results (from the past 240 hour(s))  Blood  Culture (  routine x 2)     Status: None   Collection Time: 01/09/2020 10:58 AM   Specimen: BLOOD  Result Value Ref Range Status   Specimen Description BLOOD SITE NOT SPECIFIED  Final   Special Requests   Final    BOTTLES DRAWN AEROBIC ONLY Blood Culture adequate volume   Culture   Final    NO GROWTH 5 DAYS Performed at [facility], [address], [place], [place] [address]    Report Status 01/19/2020 FINAL  Final  Blood Culture (routine x 2)     Status: None   Collection Time: 02/06/2020 11:45 AM   Specimen: BLOOD RIGHT HAND  Result Value Ref Range Status   Specimen Description BLOOD RIGHT HAND  Final   Special Requests   Final    BOTTLES DRAWN AEROBIC ONLY Blood Culture results may not be optimal due to an inadequate volume of blood received in culture bottles   Culture   Final    NO GROWTH 5 DAYS Performed at [facility], [place] [address], [place], [place] [address]    Report Status 01/19/2020 FINAL  Final  SARS CORONAVIRUS 2 (TAT 6-24 HRS) Nasopharyngeal Nasopharyngeal Swab     Status: None   Collection Time: 01/19/2020  1:59 PM   Specimen: Nasopharyngeal Swab  Result Value Ref Range Status   SARS Coronavirus 2 NEGATIVE NEGATIVE Final    Comment: (NOTE) SARS-CoV-2 target nucleic acids are NOT DETECTED. The SARS-CoV-2 RNA is generally detectable in upper and lower respiratory specimens during the acute phase of infection. Negative results do not preclude SARS-CoV-2 infection, do not rule out co-infections with other pathogens, and should not be used as the sole basis for treatment or other patient management decisions. Negative results must be combined with clinical observations, patient history, and epidemiological information. The expected result is Negative. Fact Sheet for Patients: SugarRoll.be Fact Sheet for Healthcare Providers: https://www.woods-mathews.com/ This test is not yet approved or cleared by the [place]  FDA and  has been authorized for detection and/or diagnosis of SARS-CoV-2 by FDA under an Emergency Use Authorization (EUA). This EUA will remain  in effect (meaning this test can be used) for the duration of the COVID-19 declaration under Section 56 4(b)(1) of the Act, 21 U.S.C. section 360bbb-3(b)(1), unless the authorization is terminated or revoked sooner. Performed at [facility], [place] [address], [place], [place] [address]   Urine culture     Status: Abnormal   Collection Time: 01/31/2020  3:58 PM   Specimen: In/Out Cath Urine  Result Value Ref Range Status   Specimen Description IN/OUT CATH URINE  Final   Special Requests NONE  Final   Culture (A)  Final    30,000 COLONIES/mL ESCHERICHIA COLI >=100,000 COLONIES/mL AEROCOCCUS SPECIES Standardized susceptibility testing for this organism is not available. Performed at [facility], [place] [address] [place],  [address]    Report Status 01/17/2020 FINAL  Final   Organism ID, Bacteria ESCHERICHIA COLI (A)  Final      Susceptibility   Escherichia coli - MIC*    AMPICILLIN >=32 RESISTANT Resistant     CEFAZOLIN 8 SENSITIVE Sensitive     CEFTRIAXONE <=0.25 SENSITIVE Sensitive     CIPROFLOXACIN <=0.25 SENSITIVE Sensitive     GENTAMICIN <=1 SENSITIVE Sensitive     IMIPENEM <=0.25 SENSITIVE Sensitive     NITROFURANTOIN <=16 SENSITIVE Sensitive     TRIMETH/SULFA <=20 SENSITIVE Sensitive     AMPICILLIN/SULBACTAM >=32 RESISTANT  Resistant     PIP/TAZO <=4 SENSITIVE Sensitive     * 30,000 COLONIES/mL ESCHERICHIA COLI     Labs: Basic Metabolic Panel: Recent Labs  Lab 01/19/2020 0930 01/08/2020 0930 01/15/20 0005 01/15/20 0005 01/16/20 0609 01/17/20 1255  NA 138  --  140  --  142 144  K 5.0   < > 4.4   < > 4.0 3.6  CL 108  --  107  --  110 114*  CO2 17*  --  19*  --  19* 20*  GLUCOSE 108*  --  107*  --  102* 113*  BUN 32*  --  37*  --  34* 31*  CREATININE 2.13*  --  2.00*  --  1.65* 1.57*  CALCIUM 7.9*  --   7.9*  --  7.7* 7.6*   < > = values in this interval not displayed.   Liver Function Tests: Recent Labs  Lab 01/21/2020 0930  AST 45*  ALT 20  ALKPHOS 124  BILITOT 1.3*  PROT 6.5  ALBUMIN 1.8*   No results for input(s): LIPASE, AMYLASE in the last 168 hours. No results for input(s): AMMONIA in the last 168 hours. CBC: Recent Labs  Lab 01/18/2020 0930 01/15/20 0005 01/16/20 0609 01/17/20 1255  WBC 15.5* 19.6* 15.0* 13.9*  HGB 10.4* 9.7* 9.2* 8.6*  HCT 33.9* 31.2* 29.3* 27.8*  MCV 87.6 86.4 85.2 85.8  PLT 296 272 289 273   Cardiac Enzymes: No results for input(s): CKTOTAL, CKMB, CKMBINDEX, TROPONINI in the last 168 hours. D-Dimer No results for input(s): DDIMER in the last 72 hours. BNP: Invalid input(s): POCBNP CBG: Recent Labs  Lab 01/15/2020 0936  GLUCAP 95   Anemia work up No results for input(s): VITAMINB12, FOLATE, FERRITIN, TIBC, IRON, RETICCTPCT in the last 72 hours. Urinalysis    Component Value Date/Time   COLORURINE AMBER (A) 01/24/2020 1612   APPEARANCEUR CLOUDY (A) 01/23/2020 1612   LABSPEC 1.020 01/28/2020 1612   PHURINE 5.0 01/27/2020 1612   GLUCOSEU NEGATIVE 01/16/2020 1612   HGBUR MODERATE (A) 01/13/2020 1612   BILIRUBINUR NEGATIVE 01/31/2020 1612   KETONESUR 5 (A) 01/30/2020 1612   PROTEINUR 30 (A) 01/23/2020 1612   UROBILINOGEN 1.0 02/18/2014 0040   NITRITE NEGATIVE 01/22/2020 1612   LEUKOCYTESUR SMALL (A) 01/13/2020 1612   Sepsis Labs Invalid input(s): PROCALCITONIN,  WBC,  LACTICIDVEN     SIGNED:  Vianne Bulls, MD  [facility] Feb 11, 2020, 8:06 PM Pager   If 7PM-7AM, please contact night-coverage [email or web address] Password [email or web address]

## 2020-02-07 NOTE — Progress Notes (Signed)
Palliative Medicine RN Note: Family at bedside (daughter, son in law, grandson). Upon my arrival, patient is groaning and breathing hard. I had RN Maygan give 1mg  prn Dilaudid then spoke with family.  We discussed location of care for the time Mr Sunder has remaining. Unfortunately, Mr Beer's symptoms have been rapidly evolving this morning. He is currently having Cheyne Stokes respirations and apnea, but is groaning and moaning when he is breathing. His pain and dyspnea are not controlled, and we will need to monitor how much Dilaudid he uses today to determine if he needs a gtt or if he needs scheduled doses alone.   At this time, PMT does not recommend moving Mr Saldanha. Updated Dr Eliseo Squires and [facility]. Plan to f/u before I leave this afternoon.  Marjie Skiff Marcianna Daily, RN, BSN, [facility] Palliative Medicine Team 2020-02-09 1:10 PM Office [phone]

## 2020-02-07 NOTE — Progress Notes (Addendum)
Palliative Medicine RN Note: Follow up visit. Arrived for our late morning visit with grandson Mitchell Farrell. No family in the room at the time of my visit.   Mitchell Farrell did not wake during my visit. Respirations are rapid and shallow, over 36/minute. Spoke with Hospital doctor. She will give 0.5 mg PRN hydromorphone. We discussed plan of care, as well as potential hospice referral. I made sure Mitchell Farrell has my number if/when Mitchell Farrell arrives so we can speak.  Discussed patient with PMT provider. Prognosis remains less than 2 weeks based on large wound, for which we stopped antibiotics. He is not taking PO, and while his symptoms have been minimal so far, he is starting to need IV opioids today and is already on scheduled IV robinul.  Mitchell Skiff Clydia Nieves, RN, BSN, [facility] Palliative Medicine Team 15-Feb-2020 10:48 AM Office [phone]

## 2020-02-07 NOTE — Progress Notes (Signed)
Progress Note    [employer].  [id number] DOB: Aug 24, 1919  DOA: 01/13/2020 PCP: Annetta Maw, MD    Brief Narrative:   Chief complaint: generalized/ams  Medical records reviewed and are as summarized below:  Mitchell Quan. 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 male with medical history significant ofproximalatrial fibrillation, CKD, hypothyroidism,andGERDpresented 01/21/2020 for being altered with worsening wound of his buttocks. Patient lives at home with his grandson who helps care for him. Grandson reported worsening sacral wound, decreased oral intake and increased lethargy for 7 days prior to presentation. The sacral wound became black in appearance since the beginning of this week. He was scheduled to follow-up at the [facility] hospital in [place]. Work up in the ED revealed sepsis likely related UTI, possible pna and osteomyelitis, myositis, acute kidney injury superimposed CKD.    Patient's status took a turn for the worse of the week and he was moved to comfort care  Assessment/Plan:   Principal Problem:   Sepsis ([place]) Active Problems:   PAF (paroxysmal atrial fibrillation) ([place])   Hypothyroidism   Acute kidney injury superimposed on chronic kidney disease (HCC)   Systolic CHF ([place])   Leg edema   Unstageable pressure ulcer of sacral region ([place])   Normocytic anemia   DNR (do not resuscitate)   Hypoalbuminemia   Acute lower UTI   Acute encephalopathy   Generalized weakness   Protein-calorie malnutrition, severe   Palliative care by specialist   Goals of care, counseling/discussion   Terminal care    Sepsis. Likely multifactorial I.e. uti, possible pna and osteomyelitis/myositis.  Afebrile, leukocytosis improved,  BP stable, no tachypnea.  Minimal improvement with conservative care - transition to comfort care and hospice   Pressure ulcer sacral area/osteomyelitis/myositis.  See above, comfort focused   UTI. Culture with  ecoli greater than 30000. See above, comfort focused  acute kidney injury superimposed on CKD III. Creatinine trending down today. He was also provided with IV fluids for #1. Hx chf with ef 30% -Transition towards comfort   Systolic HF. EF 30%. -Transition to comfort  PAF. EKG with ST at 98. Coumadin stopped 6 months ago due to bruising. -Transition to comfort   Hypertension. stable -Transition to comfort    Patient has been transitioned to comfort care.  Currently we are anticipating a inpatient hospital death as he is taking in minimal p.o. and requiring IV opioids.   Family Communication/Anticipated D/C date and plan/Code Status   Code Status: dnr Family Communication: Grandson at bedside 4/12 Disposition Plan: Comfort focused-anticipate hospital death   Medical Consultants:    Palliative care   Subjective:   In bed, only mumbles a few words  Objective:    Vitals:   01/19/20 0821 01/19/20 1411 01/19/20 2200 February 05, 2020 0738  BP: 130/83 105/65 105/68 118/67  Pulse: (!) 101 64 98 77  Resp: 16 17 18 16   Temp: (!) 97.4 F (36.3 C) 97.8 F (36.6 C) 98.2 F (36.8 C) 97.8 F (36.6 C)  TempSrc: Oral Oral Oral Oral  SpO2: 99% 96% 94% 99%  Weight:      Height:        Intake/Output Summary (Last 24 hours) at 02/05/20 1306 Last data filed at 01/19/2020 1700 Gross per 24 hour  Intake 120 ml  Output 300 ml  Net -180 ml   Filed Weights   01/29/2020 0920  Weight: 88 kg    Exam: In bed, will open eyes but only mumbles  a few words Tachy HR    Data Reviewed:   I have personally reviewed following labs and imaging studies:  Labs: Labs show the following:   Basic Metabolic Panel: Recent Labs  Lab 01/17/2020 0930 02/04/2020 0930 01/15/20 0005 01/15/20 0005 01/16/20 0609 01/17/20 1255  NA 138  --  140  --  142 144  K 5.0   < > 4.4   < > 4.0 3.6  CL 108  --  107  --  110 114*  CO2 17*  --  19*  --  19* 20*  GLUCOSE 108*  --  107*  --  102* 113*   BUN 32*  --  37*  --  34* 31*  CREATININE 2.13*  --  2.00*  --  1.65* 1.57*  CALCIUM 7.9*  --  7.9*  --  7.7* 7.6*   < > = values in this interval not displayed.   GFR Estimated Creatinine Clearance: 28.8 mL/min (A) (by C-G formula based on SCr of 1.57 mg/dL (H)). Liver Function Tests: Recent Labs  Lab 02/05/2020 0930  AST 45*  ALT 20  ALKPHOS 124  BILITOT 1.3*  PROT 6.5  ALBUMIN 1.8*   No results for input(s): LIPASE, AMYLASE in the last 168 hours. No results for input(s): AMMONIA in the last 168 hours. Coagulation profile Recent Labs  Lab 01/19/2020 1058  INR 1.3*    CBC: Recent Labs  Lab 01/09/2020 0930 01/15/20 0005 01/16/20 0609 01/17/20 1255  WBC 15.5* 19.6* 15.0* 13.9*  HGB 10.4* 9.7* 9.2* 8.6*  HCT 33.9* 31.2* 29.3* 27.8*  MCV 87.6 86.4 85.2 85.8  PLT 296 272 289 273   Cardiac Enzymes: No results for input(s): CKTOTAL, CKMB, CKMBINDEX, TROPONINI in the last 168 hours. BNP (last 3 results) No results for input(s): PROBNP in the last 8760 hours. CBG: Recent Labs  Lab 01/25/2020 0936  GLUCAP 95   D-Dimer: No results for input(s): DDIMER in the last 72 hours. Hgb A1c: No results for input(s): HGBA1C in the last 72 hours. Lipid Profile: No results for input(s): CHOL, HDL, LDLCALC, TRIG, CHOLHDL, LDLDIRECT in the last 72 hours. Thyroid function studies: No results for input(s): TSH, T4TOTAL, T3FREE, THYROIDAB in the last 72 hours.  Invalid input(s): FREET3 Anemia work up: No results for input(s): VITAMINB12, FOLATE, FERRITIN, TIBC, IRON, RETICCTPCT in the last 72 hours. Sepsis Labs: Recent Labs  Lab 01/22/2020 0930 01/24/2020 1058 01/09/2020 1535 01/15/20 0005 01/15/20 1200 01/16/20 0609 01/17/20 1255  WBC 15.5*  --   --  19.6*  --  15.0* 13.9*  LATICACIDVEN  --  2.1* 2.4*  --  2.9*  --   --     Microbiology Recent Results (from the past 240 hour(s))  Blood Culture (routine x 2)     Status: None   Collection Time: 01/27/2020 10:58 AM   Specimen:  BLOOD  Result Value Ref Range Status   Specimen Description BLOOD SITE NOT SPECIFIED  Final   Special Requests   Final    BOTTLES DRAWN AEROBIC ONLY Blood Culture adequate volume   Culture   Final    NO GROWTH 5 DAYS Performed at [facility], [address], [place],  [address]    Report Status 01/19/2020 FINAL  Final  Blood Culture (routine x 2)     Status: None   Collection Time: 01/25/2020 11:45 AM   Specimen: BLOOD RIGHT HAND  Result Value Ref Range Status   Specimen Description BLOOD RIGHT HAND  Final   Special Requests   Final    BOTTLES DRAWN AEROBIC ONLY Blood Culture results may not be optimal due to an inadequate volume of blood received in culture bottles   Culture   Final    NO GROWTH 5 DAYS Performed at [facility], [place] [address] [place], [place] [address]    Report Status 01/19/2020 FINAL  Final  SARS CORONAVIRUS 2 (TAT 6-24 HRS) Nasopharyngeal Nasopharyngeal Swab     Status: None   Collection Time: 01/21/2020  1:59 PM   Specimen: Nasopharyngeal Swab  Result Value Ref Range Status   SARS Coronavirus 2 NEGATIVE NEGATIVE Final    Comment: (NOTE) SARS-CoV-2 target nucleic acids are NOT DETECTED. The SARS-CoV-2 RNA is generally detectable in upper and lower respiratory specimens during the acute phase of infection. Negative results do not preclude SARS-CoV-2 infection, do not rule out co-infections with other pathogens, and should not be used as the sole basis for treatment or other patient management decisions. Negative results must be combined with clinical observations, patient history, and epidemiological information. The expected result is Negative. Fact Sheet for Patients: SugarRoll.be Fact Sheet for Healthcare Providers: https://www.woods-mathews.com/ This test is not yet approved or cleared by the [place] FDA and  has been authorized for detection and/or diagnosis of SARS-CoV-2 by FDA under  an Emergency Use Authorization (EUA). This EUA will remain  in effect (meaning this test can be used) for the duration of the COVID-19 declaration under Section 56 4(b)(1) of the Act, 21 U.S.C. section 360bbb-3(b)(1), unless the authorization is terminated or revoked sooner. Performed at [facility], [place] [address], [place], [place] [address]   Urine culture     Status: Abnormal   Collection Time: 01/17/2020  3:58 PM   Specimen: In/Out Cath Urine  Result Value Ref Range Status   Specimen Description IN/OUT CATH URINE  Final   Special Requests NONE  Final   Culture (A)  Final    30,000 COLONIES/mL ESCHERICHIA COLI >=100,000 COLONIES/mL AEROCOCCUS SPECIES Standardized susceptibility testing for this organism is not available. Performed at [facility], [place] [address], [place], [place] [address]    Report Status 01/17/2020 FINAL  Final   Organism ID, Bacteria ESCHERICHIA COLI (A)  Final      Susceptibility   Escherichia coli - MIC*    AMPICILLIN >=32 RESISTANT Resistant     CEFAZOLIN 8 SENSITIVE Sensitive     CEFTRIAXONE <=0.25 SENSITIVE Sensitive     CIPROFLOXACIN <=0.25 SENSITIVE Sensitive     GENTAMICIN <=1 SENSITIVE Sensitive     IMIPENEM <=0.25 SENSITIVE Sensitive     NITROFURANTOIN <=16 SENSITIVE Sensitive     TRIMETH/SULFA <=20 SENSITIVE Sensitive     AMPICILLIN/SULBACTAM >=32 RESISTANT Resistant     PIP/TAZO <=4 SENSITIVE Sensitive     * 30,000 COLONIES/mL ESCHERICHIA COLI    Procedures and diagnostic studies:  No results found.  Medications:   . Chlorhexidine Gluconate Cloth  6 each Topical Q0600  . dorzolamide  1 drop Both Eyes BID  . glycopyrrolate  0.4 mg Intravenous TID  . latanoprost  1 drop Both Eyes QHS   Continuous Infusions:    LOS: 6 days   Mitchell Girt DO [facility]   How to contact the [facility] Attending or Consulting provider [place] or covering provider during after hours [place], for this patient?  1. Check the care team in  [facility] and look for a) attending/consulting [facility] provider listed  and b) the [facility] team listed 2. Log into [email or web address] and use [place]'s universal password to access. If you do not have the password, please contact the hospital operator. 3. Locate the [facility] provider you are looking for under [facility] and page to a number that you can be directly reached. 4. If you still have difficulty reaching the provider, please page the [facility] (Director on Call) for the Hospitalists listed on amion for assistance.  02/09/2020, 1:06 PM

## 2020-02-07 NOTE — Progress Notes (Signed)
Palliative Medicine RN Note: Afternoon symptom check.   Mitchell Farrell is having apnea, but he is still grimacing and groaning when breathing. He lost IV access since my last assessment, and meds had to be adjusted and given sublingually. His initial response was appropriate per RN Mitchell Farrell, but he is once again showing signs of discomfort. He is having copious secretions; scop patch has been applied. Will have RN turn patient on his side to 45 degrees.  His daughter Mitchell Farrell remains at the bedside. We discussed plan for medication and for turning to 45 degrees. She is saddened by the imminent loss of her father, but she states "he had a lot of good years" and that God is in charge of when it is his time to go. She is comforted by the prayers that the family said during Mitchell Farrell's admission; they prayed that they are able to accept God's plan.  Obtained orders to start hydromorphone infusion and schedule lorazepam, as symptoms continue to increase. Plan for PMT to follow up in the am if patient survives the night.  Mitchell Skiff Eliani Leclere, RN, BSN, [facility] Palliative Medicine Team 2020/01/21 3:57 PM Office [phone]

## 2020-02-07 NOTE — Plan of Care (Signed)
  Problem: Pain Managment: Goal: General experience of comfort will improve Outcome: Progressing   Problem: Safety: Goal: Ability to remain free from injury will improve Outcome: Progressing   

## 2020-02-07 NOTE — Progress Notes (Addendum)
Pt passed at [place], time of death verified by Lorianne Malbrough, RN and Mable Fill, RN. family and provider have been made aware. [place] donor services called and pt is not a potential donor. body has been cleaned and prepared for family visitation who are currently at bedside. Oncoming RN david given report on pt and pt case. Family has chosen [employer], their number is [phone].

## 2020-02-07 DEATH — deceased

## 2020-03-18 ENCOUNTER — Ambulatory Visit: Payer: Federal, State, Local not specified - PPO | Admitting: Podiatry

## 2021-01-25 IMAGING — MR MR SACRUM / SI JOINTS WO CM
4 of 7 series · 21 of 48 positions shown · non-contrast
Comparison: None.

CLINICAL DATA: Sacral decubitus ulcer.

EXAM:
MRI PELVIS WITHOUT CONTRAST
TECHNIQUE: Multiplanar multisequence MR imaging of the pelvis was performed. No
intravenous contrast was administered.

[Series 3: T1 · axial · 4.0mm · 0.43mm/px · z∈[-67,+88]mm · 8 of 32 slices shown (1 of 2)]
[im 1/32]
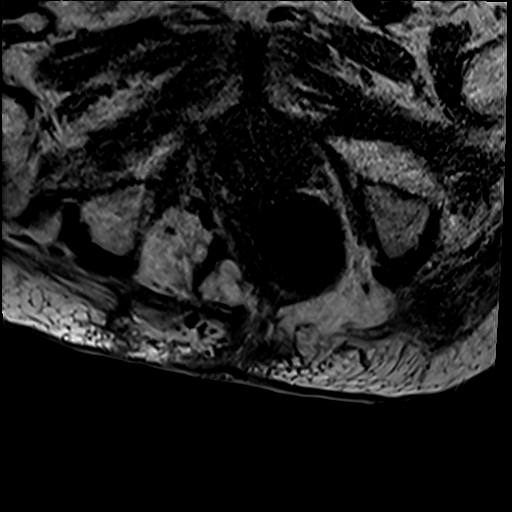
[im 5/32]
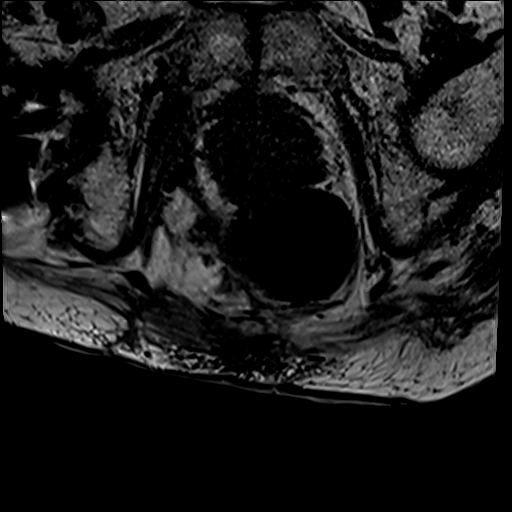
[im 9/32]
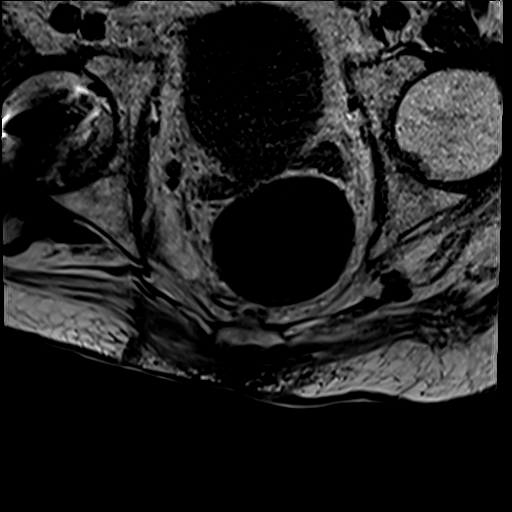
[im 14/32]
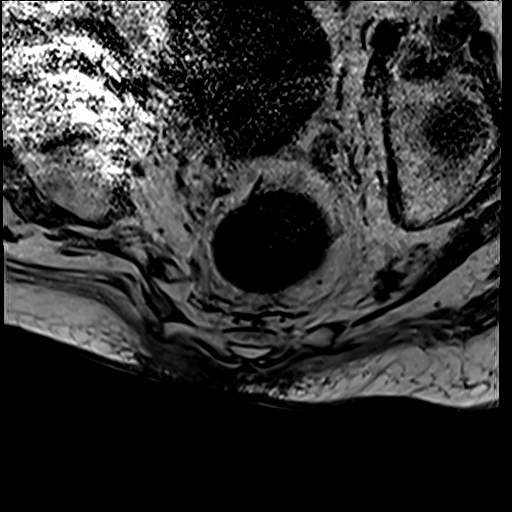
[im 18/32]
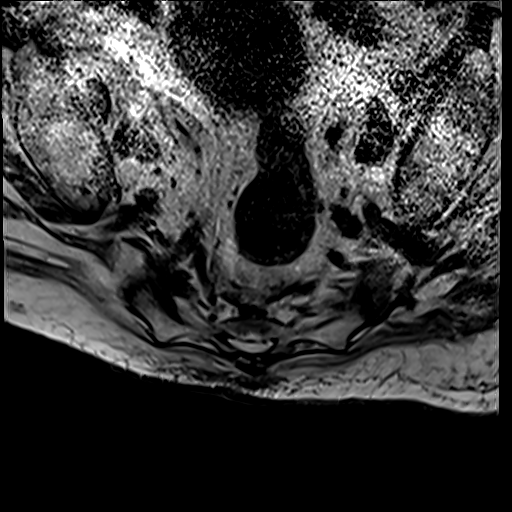
[im 23/32]
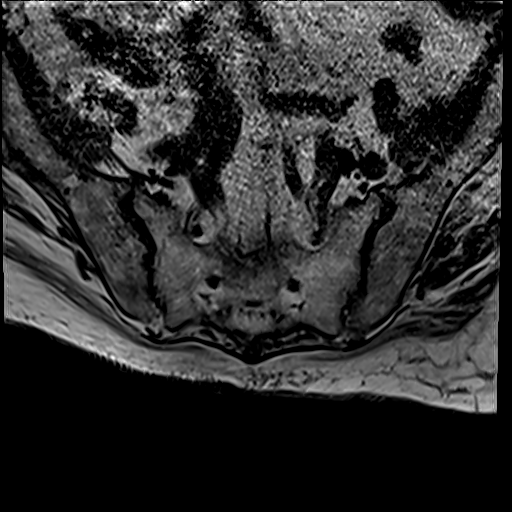
[im 27/32]
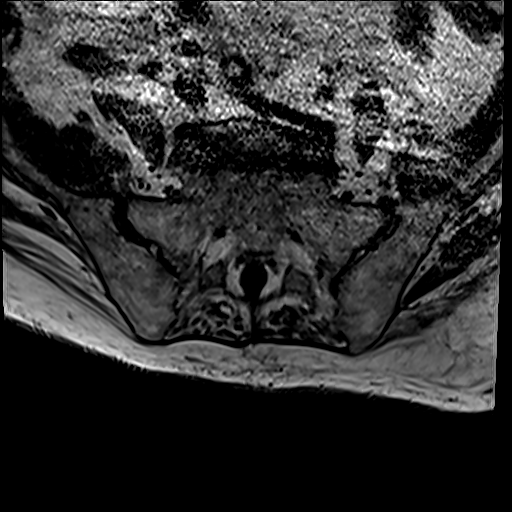
[im 32/32]
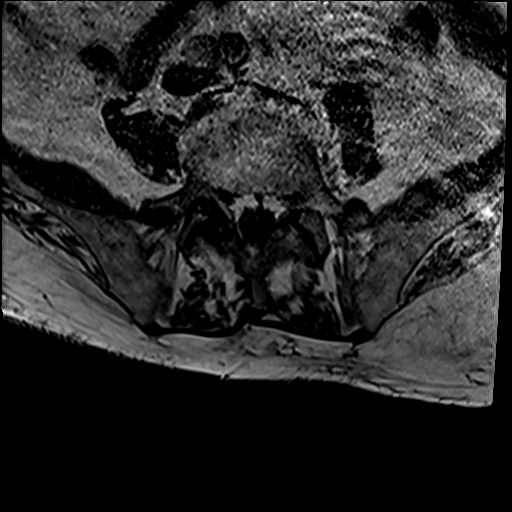

[Series 6: STIR · coronal · 3.0mm · 0.51mm/px · 4 of 29 slices shown (1 of 2)]
[im 1/29]
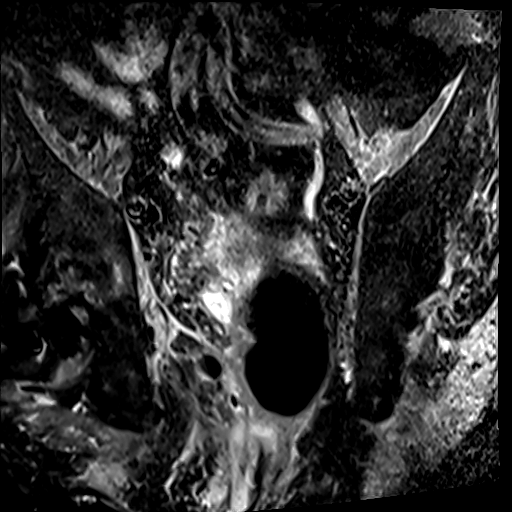
[im 6/29]
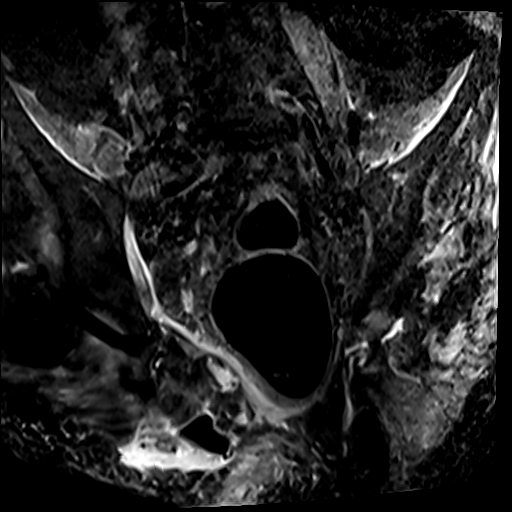
[im 17/29]
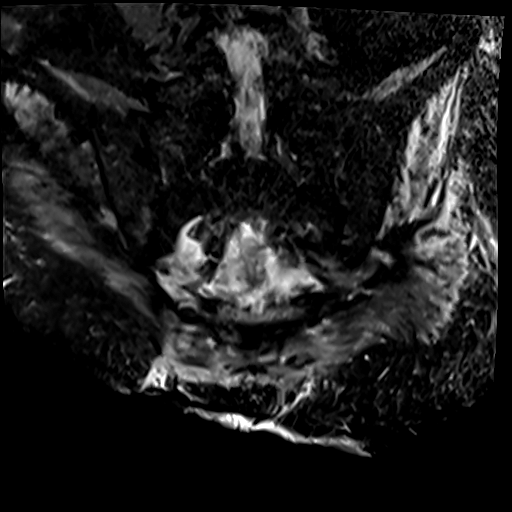
[im 29/29]
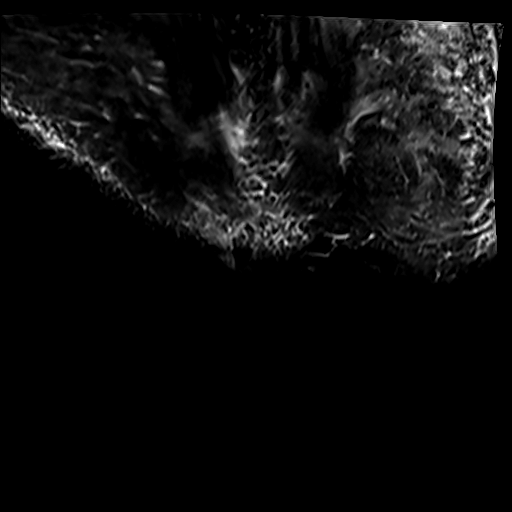

[Series 7: T1 · coronal · 3.0mm · 0.51mm/px · 6 of 29 slices shown (2 of 2)]
[im 1/29]
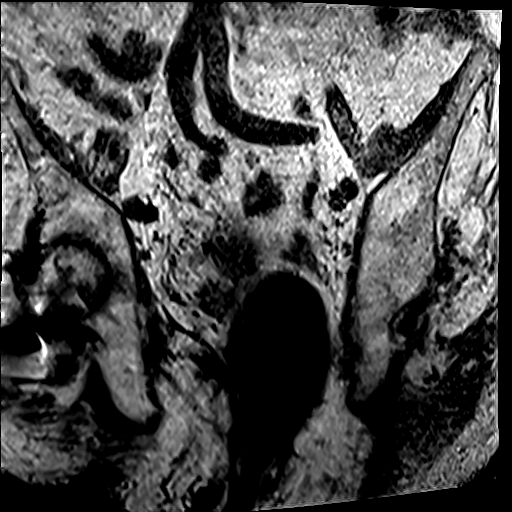
[im 6/29]
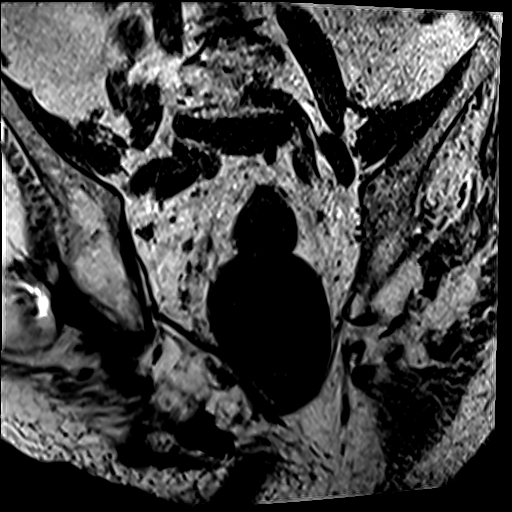
[im 12/29]
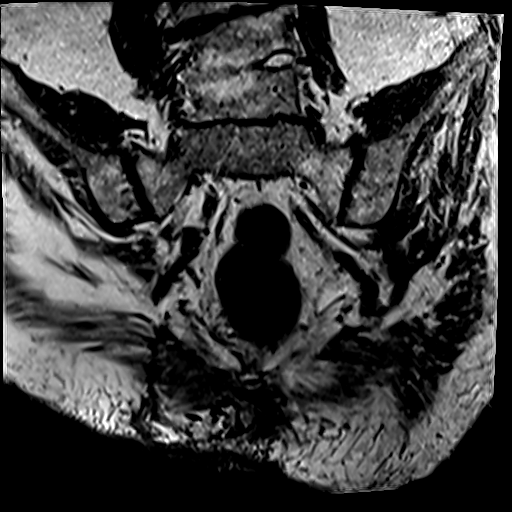
[im 17/29]
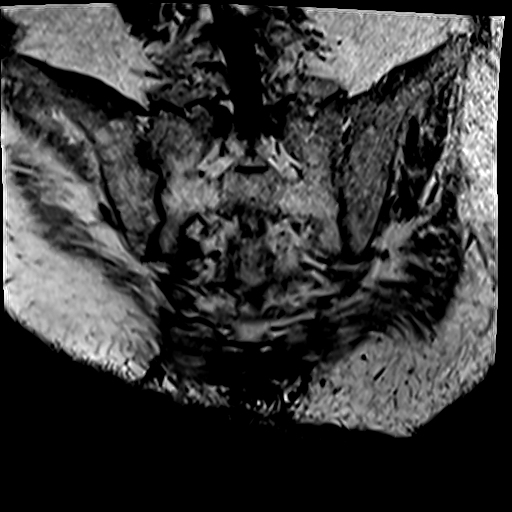
[im 23/29]
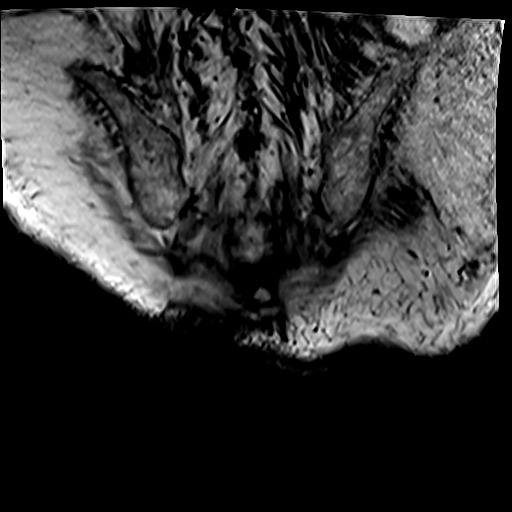
[im 29/29]
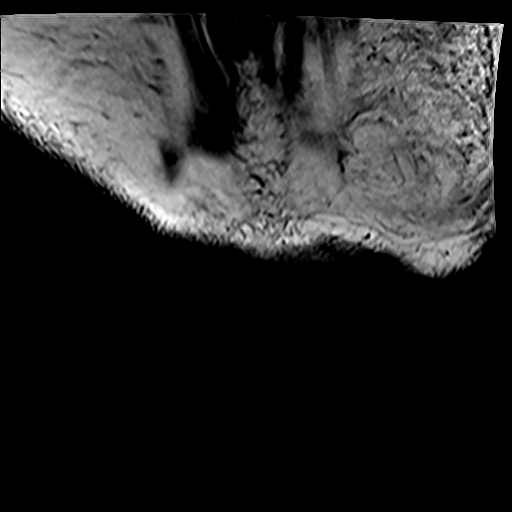

[Series 8: STIR · axial · 4.0mm · 0.86mm/px · z∈[-42,+58]mm · 3 of 32 slices shown (2 of 2)]
[im 6/32]
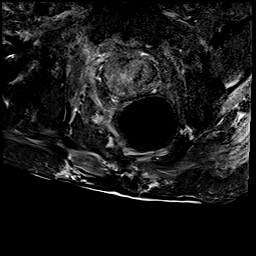
[im 16/32]
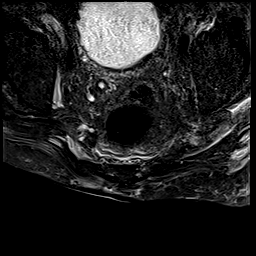
[im 26/32]
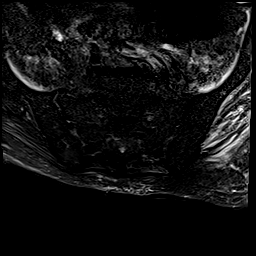

[21 of 48 positions shown; findings below may reference images not displayed]

FINDINGS: A sacral decubitus ulcer is noted in the midline and to the right of
the sacrum. No abscess is identified. Associated myositis involving
the gluteus maximus muscles but no findings for pyomyositis. There
is also diffuse myositis involving the left gluteus minimus and
medius muscles. Abnormal T2 and STIR signal intensity in the coccyx
segments consistent with osteomyelitis.

No significant intrapelvic abnormalities are identified. No
intrapelvic or presacral abscess.

Both hips are normally located. Right hip hardware is noted. The SI
joints are intact. No findings for septic arthritis.
IMPRESSION: 1. Sacral decubitus ulcer in the midline and to the right of the
sacrum.
2. Osteomyelitis involving the coccyx segments.
3. Myositis involving the left gluteus minimus and medius muscles
and both gluteus maximus muscles.
4. No findings for pyomyositis, septic arthritis or intrapelvic
abscess.
